# Patient Record
Sex: Male | Born: 1955
Health system: Southern US, Community
[De-identification: ages and names within clinical notes are randomized; demographics above are authoritative.]

## PROBLEM LIST (undated history)

## (undated) DIAGNOSIS — M25519 Pain in unspecified shoulder: Secondary | ICD-10-CM

## (undated) DIAGNOSIS — K900 Celiac disease: Secondary | ICD-10-CM

## (undated) DIAGNOSIS — M549 Dorsalgia, unspecified: Secondary | ICD-10-CM

## (undated) DIAGNOSIS — E785 Hyperlipidemia, unspecified: Secondary | ICD-10-CM

## (undated) DIAGNOSIS — E139 Other specified diabetes mellitus without complications: Secondary | ICD-10-CM

## (undated) DIAGNOSIS — G8929 Other chronic pain: Secondary | ICD-10-CM

## (undated) DIAGNOSIS — E1140 Type 2 diabetes mellitus with diabetic neuropathy, unspecified: Secondary | ICD-10-CM

## (undated) DIAGNOSIS — K648 Other hemorrhoids: Secondary | ICD-10-CM

## (undated) DIAGNOSIS — I10 Essential (primary) hypertension: Secondary | ICD-10-CM

## (undated) HISTORY — DX: Other chronic pain: M25.519

## (undated) HISTORY — PX: COLONOSCOPY: SHX174

## (undated) HISTORY — DX: Type 2 diabetes mellitus with diabetic neuropathy, unspecified: E11.40

## (undated) HISTORY — DX: Other chronic pain: M54.9

## (undated) HISTORY — DX: Essential (primary) hypertension: I10

## (undated) HISTORY — DX: Celiac disease: K90.0

## (undated) HISTORY — DX: Hyperlipidemia, unspecified: E78.5

## (undated) HISTORY — DX: Other specified diabetes mellitus without complications: E13.9

## (undated) HISTORY — PX: NECK SURGERY: SHX720

## (undated) HISTORY — DX: Other chronic pain: G89.29

## (undated) HISTORY — DX: Other hemorrhoids: K64.8

---

## 1998-03-06 ENCOUNTER — Other Ambulatory Visit: Admission: RE | Admit: 1998-03-06 | Discharge: 1998-03-06 | Payer: Self-pay | Admitting: Dermatology

## 1999-01-14 ENCOUNTER — Encounter: Admission: RE | Admit: 1999-01-14 | Discharge: 1999-04-14 | Payer: Self-pay | Admitting: Endocrinology

## 1999-04-23 ENCOUNTER — Encounter: Admission: RE | Admit: 1999-04-23 | Discharge: 1999-07-22 | Payer: Self-pay | Admitting: Endocrinology

## 2001-04-24 ENCOUNTER — Emergency Department (HOSPITAL_COMMUNITY): Admission: EM | Admit: 2001-04-24 | Discharge: 2001-04-24 | Payer: Self-pay | Admitting: Emergency Medicine

## 2007-09-14 HISTORY — PX: SHOULDER SURGERY: SHX246

## 2007-12-25 ENCOUNTER — Encounter: Admission: RE | Admit: 2007-12-25 | Discharge: 2007-12-25 | Payer: Self-pay | Admitting: Sports Medicine

## 2009-09-13 HISTORY — PX: ESOPHAGOGASTRODUODENOSCOPY: SHX1529

## 2014-07-26 ENCOUNTER — Other Ambulatory Visit: Payer: Self-pay | Admitting: Endocrinology

## 2014-07-26 DIAGNOSIS — R109 Unspecified abdominal pain: Secondary | ICD-10-CM

## 2014-08-02 ENCOUNTER — Other Ambulatory Visit: Payer: Self-pay

## 2015-10-07 DIAGNOSIS — M5412 Radiculopathy, cervical region: Secondary | ICD-10-CM | POA: Insufficient documentation

## 2015-12-16 DIAGNOSIS — M542 Cervicalgia: Secondary | ICD-10-CM | POA: Diagnosis not present

## 2015-12-23 DIAGNOSIS — G894 Chronic pain syndrome: Secondary | ICD-10-CM | POA: Diagnosis not present

## 2015-12-23 DIAGNOSIS — M545 Low back pain: Secondary | ICD-10-CM | POA: Diagnosis not present

## 2015-12-23 DIAGNOSIS — M25512 Pain in left shoulder: Secondary | ICD-10-CM | POA: Diagnosis not present

## 2015-12-23 DIAGNOSIS — M25511 Pain in right shoulder: Secondary | ICD-10-CM | POA: Diagnosis not present

## 2016-01-20 DIAGNOSIS — G894 Chronic pain syndrome: Secondary | ICD-10-CM | POA: Diagnosis not present

## 2016-01-20 DIAGNOSIS — M25512 Pain in left shoulder: Secondary | ICD-10-CM | POA: Diagnosis not present

## 2016-01-20 DIAGNOSIS — M545 Low back pain: Secondary | ICD-10-CM | POA: Diagnosis not present

## 2016-01-20 DIAGNOSIS — M25511 Pain in right shoulder: Secondary | ICD-10-CM | POA: Diagnosis not present

## 2016-01-20 DIAGNOSIS — Z79891 Long term (current) use of opiate analgesic: Secondary | ICD-10-CM | POA: Diagnosis not present

## 2016-01-23 DIAGNOSIS — E1065 Type 1 diabetes mellitus with hyperglycemia: Secondary | ICD-10-CM | POA: Diagnosis not present

## 2016-02-12 DIAGNOSIS — E1021 Type 1 diabetes mellitus with diabetic nephropathy: Secondary | ICD-10-CM | POA: Diagnosis not present

## 2016-02-12 DIAGNOSIS — E1142 Type 2 diabetes mellitus with diabetic polyneuropathy: Secondary | ICD-10-CM | POA: Diagnosis not present

## 2016-02-17 DIAGNOSIS — M542 Cervicalgia: Secondary | ICD-10-CM | POA: Diagnosis not present

## 2016-02-17 DIAGNOSIS — I10 Essential (primary) hypertension: Secondary | ICD-10-CM | POA: Diagnosis not present

## 2016-02-17 DIAGNOSIS — Z6832 Body mass index (BMI) 32.0-32.9, adult: Secondary | ICD-10-CM | POA: Diagnosis not present

## 2016-02-19 DIAGNOSIS — M546 Pain in thoracic spine: Secondary | ICD-10-CM | POA: Diagnosis not present

## 2016-02-19 DIAGNOSIS — Z79891 Long term (current) use of opiate analgesic: Secondary | ICD-10-CM | POA: Diagnosis not present

## 2016-02-19 DIAGNOSIS — M25512 Pain in left shoulder: Secondary | ICD-10-CM | POA: Diagnosis not present

## 2016-02-19 DIAGNOSIS — M25511 Pain in right shoulder: Secondary | ICD-10-CM | POA: Diagnosis not present

## 2016-02-19 DIAGNOSIS — M542 Cervicalgia: Secondary | ICD-10-CM | POA: Diagnosis not present

## 2016-03-03 DIAGNOSIS — F411 Generalized anxiety disorder: Secondary | ICD-10-CM | POA: Diagnosis not present

## 2016-03-05 DIAGNOSIS — E785 Hyperlipidemia, unspecified: Secondary | ICD-10-CM | POA: Insufficient documentation

## 2016-03-05 DIAGNOSIS — N059 Unspecified nephritic syndrome with unspecified morphologic changes: Secondary | ICD-10-CM | POA: Insufficient documentation

## 2016-03-05 DIAGNOSIS — E109 Type 1 diabetes mellitus without complications: Secondary | ICD-10-CM | POA: Insufficient documentation

## 2016-03-05 DIAGNOSIS — E10649 Type 1 diabetes mellitus with hypoglycemia without coma: Secondary | ICD-10-CM

## 2016-03-05 DIAGNOSIS — E1140 Type 2 diabetes mellitus with diabetic neuropathy, unspecified: Secondary | ICD-10-CM | POA: Insufficient documentation

## 2016-03-05 DIAGNOSIS — E559 Vitamin D deficiency, unspecified: Secondary | ICD-10-CM | POA: Insufficient documentation

## 2016-03-05 DIAGNOSIS — I10 Essential (primary) hypertension: Secondary | ICD-10-CM | POA: Insufficient documentation

## 2016-03-05 DIAGNOSIS — E1065 Type 1 diabetes mellitus with hyperglycemia: Secondary | ICD-10-CM | POA: Insufficient documentation

## 2016-03-05 DIAGNOSIS — E1049 Type 1 diabetes mellitus with other diabetic neurological complication: Secondary | ICD-10-CM

## 2016-03-19 ENCOUNTER — Ambulatory Visit (INDEPENDENT_AMBULATORY_CARE_PROVIDER_SITE_OTHER): Payer: BLUE CROSS/BLUE SHIELD | Admitting: Cardiovascular Disease

## 2016-03-19 ENCOUNTER — Encounter: Payer: Self-pay | Admitting: Cardiovascular Disease

## 2016-03-19 ENCOUNTER — Encounter (INDEPENDENT_AMBULATORY_CARE_PROVIDER_SITE_OTHER): Payer: Self-pay

## 2016-03-19 VITALS — BP 156/84 | HR 68 | Ht 66.0 in | Wt 166.0 lb

## 2016-03-19 DIAGNOSIS — R0789 Other chest pain: Secondary | ICD-10-CM

## 2016-03-19 DIAGNOSIS — I10 Essential (primary) hypertension: Secondary | ICD-10-CM | POA: Diagnosis not present

## 2016-03-19 DIAGNOSIS — R079 Chest pain, unspecified: Secondary | ICD-10-CM | POA: Insufficient documentation

## 2016-03-19 NOTE — Progress Notes (Signed)
Cardiology Office Note   Date:  03/19/2016   ID:  Kyle CarmineJerry Penninger, DOB 03/13/1956, MRN [id number]  PCP:  Julian HySOUTH,STEPHEN ALAN, MD  Cardiologist:   Kristeen MissPhilip Nahser, MD   Chief Complaint  Patient presents with  . Chest Pain   Problem list 1. Chest discomfort 2. Diabetes mellitus type 1 -with diabetic nephropathy and diabetic neuropathy 3. Essential hypertension 4. Hyperlipidemia   History of Present Illness: Kyle Velazquez is a 60 y.o. male who presents for Further evaluation of his chest pain. Has type 1 DM.  Has had chest pain ever since he had neck  surgery, FEb. 2017   Chest pain - not worsened with exertion .   Occurs if he twists his neck or torso.   No associated dyspnea.  Has not had a stress test in the past 10 years.   He works out every day .   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    Past Medical History  Diagnosis Date  . Hyperlipidemia   . Hypertension   . Diabetic neuropathy [facility](HCC)     Past Surgical History  Procedure Laterality Date  . Shoulder surgery  2009     Current Outpatient Prescriptions  Medication Sig Dispense Refill  . ALPRAZolam (XANAX) 1 MG tablet Take 1 mg by mouth 3 (three) times daily as needed. AS NEEDED FOR ANXIETY  0  . aspirin 81 MG chewable tablet Chew 81 mg by mouth daily.    Marland Kitchen. glucose blood test strip 1 each by Other route as needed for other. Use as instructed    . insulin aspart (NOVOLOG) 100 UNIT/ML injection Inject into the skin 3 (three) times daily before meals. INJECT UP TO 70 UNITS DAILY ON A SLIDING SCALE    . PERCOCET 10-325 MG tablet Take 1 tablet by mouth every 6 (six) hours as needed. AS NEEDED FOR PAIN  0  . ramipril (ALTACE) 5 MG capsule Take 5 mg by mouth daily.    . sertraline (ZOLOFT) 50 MG tablet Take 25 mg by mouth daily.  0  . valACYclovir (VALTREX) 500 MG tablet Take 500 mg by mouth daily.    Marland Kitchen. VIAGRA 100 MG tablet Take 100 mg by mouth daily as needed. AS NEEDED FOR ED  0  . Vitamin D,  Ergocalciferol, (DRISDOL) 50000 units CAPS capsule Take 50,000 Units by mouth every 7 (seven) days.     No current facility-administered medications for this visit.    Allergies:   Review of patient's allergies indicates not on file.    Social History:  The patient  reports that he has quit smoking. He does not have any smokeless tobacco history on file. He reports that he does not drink alcohol or use illicit drugs.   Family History:  The patient's family history includes CAD in his father and mother.    ROS:  Please see the history of present illness.    Review of Systems: Constitutional:  denies fever, chills, diaphoresis, appetite change and fatigue.  HEENT: denies photophobia, eye pain, redness, hearing loss, ear pain, congestion, sore throat, rhinorrhea, sneezing, neck pain, neck stiffness and tinnitus.  Respiratory: denies SOB, DOE, cough, chest tightness, and wheezing.  Cardiovascular: denies chest pain, palpitations and leg swelling.  Gastrointestinal: denies nausea, vomiting, abdominal pain, diarrhea, constipation, blood in stool.  Genitourinary: denies dysuria, urgency, frequency, hematuria, flank pain and difficulty urinating.  Musculoskeletal: denies  myalgias, back pain, joint swelling, arthralgias and gait problem.   Skin: denies pallor, rash  and wound.  Neurological: denies dizziness, seizures, syncope, weakness, light-headedness, numbness and headaches.   Hematological: denies adenopathy, easy bruising, personal or family bleeding history.  Psychiatric/ Behavioral: denies suicidal ideation, mood changes, confusion, nervousness, sleep disturbance and agitation.     All other systems are reviewed and negative.    PHYSICAL EXAM: VS:  BP 156/84 mmHg  Pulse 68  Ht 5\' 6"  (1.676 m)  Wt 166 lb (75.297 kg)  BMI 26.81 kg/m2 , BMI Body mass index is 26.81 kg/(m^2). GEN: Well nourished, well developed, in no acute distress HEENT: normal Neck: no JVD, carotid bruits, or  masses Cardiac: RRR; no murmurs, rubs, or gallops,no edema  Respiratory:  clear to auscultation bilaterally, normal work of breathing GI: soft, nontender, nondistended, + BS MS: no deformity or atrophy Skin: warm and dry, no rash Neuro:  Strength and sensation are intact Psych: normal   EKG:  EKG is ordered today. The ekg ordered today demonstrates NSR at 68 with sinus arhythmia.   Recent Labs: No results found for requested labs within last 365 days.    Lipid Panel No results found for: CHOL, TRIG, HDL, CHOLHDL, VLDL, LDLCALC, LDLDIRECT    Wt Readings from Last 3 Encounters:  03/19/16 166 lb (75.297 kg)     Other studies Reviewed: Additional studies/ records that were reviewed today include: . Review of the above records demonstrates:    ASSESSMENT AND PLAN:  1.  Chest pain :   Sounds atypical ,  Seems to be related to his neck issues. .    Worse in the AM when he wakes up.   Not related to exertion.  His symptoms sound very atypical and are not likely ischemic in nature   He Does have risk factors for coronary artery disease so we will see him back in year for follow-up visit. We discussed typical symptoms of angina including chest tightness with exertion. He'll let us know if he has any other symptoms.         Current medicines are reviewed at length with the patient today.  The patient does not have concerns regarding medicines.  The following changes have been made:  no change  Labs/ tests ordered today include:  No orders of the defined types were placed in this encounter.     Disposition:   FU with me in 1 year      Kristeen MissPhilip Nahser, MD  03/19/2016 2:52 PM    [facility] [address], [place], [place]  [address] Phone: [phone]; Fax: [phone]   [facility]  [address] [place], [place]  [address] [phone]   Fax [phone]

## 2016-03-19 NOTE — Patient Instructions (Signed)

## 2016-03-22 DIAGNOSIS — G894 Chronic pain syndrome: Secondary | ICD-10-CM | POA: Diagnosis not present

## 2016-03-22 DIAGNOSIS — M25512 Pain in left shoulder: Secondary | ICD-10-CM | POA: Diagnosis not present

## 2016-03-22 DIAGNOSIS — Z79891 Long term (current) use of opiate analgesic: Secondary | ICD-10-CM | POA: Diagnosis not present

## 2016-03-22 DIAGNOSIS — M5412 Radiculopathy, cervical region: Secondary | ICD-10-CM | POA: Diagnosis not present

## 2016-03-22 DIAGNOSIS — M25511 Pain in right shoulder: Secondary | ICD-10-CM | POA: Diagnosis not present

## 2016-04-12 DIAGNOSIS — Z6832 Body mass index (BMI) 32.0-32.9, adult: Secondary | ICD-10-CM | POA: Diagnosis not present

## 2016-04-12 DIAGNOSIS — I10 Essential (primary) hypertension: Secondary | ICD-10-CM | POA: Diagnosis not present

## 2016-04-12 DIAGNOSIS — M542 Cervicalgia: Secondary | ICD-10-CM | POA: Diagnosis not present

## 2016-04-17 ENCOUNTER — Other Ambulatory Visit: Payer: Self-pay | Admitting: Neurological Surgery

## 2016-04-17 DIAGNOSIS — M542 Cervicalgia: Secondary | ICD-10-CM

## 2016-04-21 DIAGNOSIS — M25512 Pain in left shoulder: Secondary | ICD-10-CM | POA: Diagnosis not present

## 2016-04-21 DIAGNOSIS — Z79891 Long term (current) use of opiate analgesic: Secondary | ICD-10-CM | POA: Diagnosis not present

## 2016-04-21 DIAGNOSIS — M545 Low back pain: Secondary | ICD-10-CM | POA: Diagnosis not present

## 2016-04-21 DIAGNOSIS — M542 Cervicalgia: Secondary | ICD-10-CM | POA: Diagnosis not present

## 2016-04-21 DIAGNOSIS — G894 Chronic pain syndrome: Secondary | ICD-10-CM | POA: Diagnosis not present

## 2016-04-23 ENCOUNTER — Ambulatory Visit
Admission: RE | Admit: 2016-04-23 | Discharge: 2016-04-23 | Disposition: A | Discharge status: Home / Self Care | Payer: BLUE CROSS/BLUE SHIELD | Source: Ambulatory Visit | Attending: Neurological Surgery | Admitting: Neurological Surgery

## 2016-04-23 DIAGNOSIS — M542 Cervicalgia: Secondary | ICD-10-CM

## 2016-04-23 DIAGNOSIS — M50321 Other cervical disc degeneration at C4-C5 level: Secondary | ICD-10-CM | POA: Diagnosis not present

## 2016-04-23 DIAGNOSIS — M50323 Other cervical disc degeneration at C6-C7 level: Secondary | ICD-10-CM | POA: Diagnosis not present

## 2016-04-23 DIAGNOSIS — M50322 Other cervical disc degeneration at C5-C6 level: Secondary | ICD-10-CM | POA: Diagnosis not present

## 2016-04-24 DIAGNOSIS — Z23 Encounter for immunization: Secondary | ICD-10-CM | POA: Diagnosis not present

## 2016-05-04 DIAGNOSIS — M542 Cervicalgia: Secondary | ICD-10-CM | POA: Diagnosis not present

## 2016-05-04 DIAGNOSIS — Z6832 Body mass index (BMI) 32.0-32.9, adult: Secondary | ICD-10-CM | POA: Diagnosis not present

## 2016-05-04 DIAGNOSIS — I10 Essential (primary) hypertension: Secondary | ICD-10-CM | POA: Diagnosis not present

## 2016-05-11 DIAGNOSIS — H40013 Open angle with borderline findings, low risk, bilateral: Secondary | ICD-10-CM | POA: Diagnosis not present

## 2016-05-11 DIAGNOSIS — H11152 Pinguecula, left eye: Secondary | ICD-10-CM | POA: Diagnosis not present

## 2016-05-13 DIAGNOSIS — E1065 Type 1 diabetes mellitus with hyperglycemia: Secondary | ICD-10-CM | POA: Diagnosis not present

## 2016-05-18 DIAGNOSIS — Z79891 Long term (current) use of opiate analgesic: Secondary | ICD-10-CM | POA: Diagnosis not present

## 2016-05-19 DIAGNOSIS — M25512 Pain in left shoulder: Secondary | ICD-10-CM | POA: Diagnosis not present

## 2016-05-19 DIAGNOSIS — M545 Low back pain: Secondary | ICD-10-CM | POA: Diagnosis not present

## 2016-05-19 DIAGNOSIS — G894 Chronic pain syndrome: Secondary | ICD-10-CM | POA: Diagnosis not present

## 2016-05-19 DIAGNOSIS — M542 Cervicalgia: Secondary | ICD-10-CM | POA: Diagnosis not present

## 2016-06-15 DIAGNOSIS — G894 Chronic pain syndrome: Secondary | ICD-10-CM | POA: Diagnosis not present

## 2016-06-15 DIAGNOSIS — M79621 Pain in right upper arm: Secondary | ICD-10-CM | POA: Diagnosis not present

## 2016-06-15 DIAGNOSIS — M25512 Pain in left shoulder: Secondary | ICD-10-CM | POA: Diagnosis not present

## 2016-06-15 DIAGNOSIS — R0683 Snoring: Secondary | ICD-10-CM | POA: Diagnosis not present

## 2016-06-15 DIAGNOSIS — M545 Low back pain: Secondary | ICD-10-CM | POA: Diagnosis not present

## 2016-06-15 DIAGNOSIS — M79622 Pain in left upper arm: Secondary | ICD-10-CM | POA: Diagnosis not present

## 2016-06-15 DIAGNOSIS — M542 Cervicalgia: Secondary | ICD-10-CM | POA: Diagnosis not present

## 2016-06-21 DIAGNOSIS — E1021 Type 1 diabetes mellitus with diabetic nephropathy: Secondary | ICD-10-CM | POA: Diagnosis not present

## 2016-06-21 DIAGNOSIS — Z4681 Encounter for fitting and adjustment of insulin pump: Secondary | ICD-10-CM | POA: Diagnosis not present

## 2016-06-21 DIAGNOSIS — N08 Glomerular disorders in diseases classified elsewhere: Secondary | ICD-10-CM | POA: Diagnosis not present

## 2016-06-21 DIAGNOSIS — I10 Essential (primary) hypertension: Secondary | ICD-10-CM | POA: Diagnosis not present

## 2016-06-24 DIAGNOSIS — R079 Chest pain, unspecified: Secondary | ICD-10-CM | POA: Diagnosis not present

## 2016-06-24 DIAGNOSIS — E1021 Type 1 diabetes mellitus with diabetic nephropathy: Secondary | ICD-10-CM | POA: Diagnosis not present

## 2016-06-24 DIAGNOSIS — E559 Vitamin D deficiency, unspecified: Secondary | ICD-10-CM | POA: Diagnosis not present

## 2016-06-24 DIAGNOSIS — N08 Glomerular disorders in diseases classified elsewhere: Secondary | ICD-10-CM | POA: Diagnosis not present

## 2016-07-13 DIAGNOSIS — M545 Low back pain: Secondary | ICD-10-CM | POA: Diagnosis not present

## 2016-07-13 DIAGNOSIS — Z79891 Long term (current) use of opiate analgesic: Secondary | ICD-10-CM | POA: Diagnosis not present

## 2016-07-13 DIAGNOSIS — M542 Cervicalgia: Secondary | ICD-10-CM | POA: Diagnosis not present

## 2016-07-13 DIAGNOSIS — M25511 Pain in right shoulder: Secondary | ICD-10-CM | POA: Diagnosis not present

## 2016-07-13 DIAGNOSIS — G894 Chronic pain syndrome: Secondary | ICD-10-CM | POA: Diagnosis not present

## 2016-08-09 DIAGNOSIS — E1065 Type 1 diabetes mellitus with hyperglycemia: Secondary | ICD-10-CM | POA: Diagnosis not present

## 2016-08-10 DIAGNOSIS — G894 Chronic pain syndrome: Secondary | ICD-10-CM | POA: Diagnosis not present

## 2016-08-10 DIAGNOSIS — M545 Low back pain: Secondary | ICD-10-CM | POA: Diagnosis not present

## 2016-08-10 DIAGNOSIS — Z79891 Long term (current) use of opiate analgesic: Secondary | ICD-10-CM | POA: Diagnosis not present

## 2016-08-10 DIAGNOSIS — M25512 Pain in left shoulder: Secondary | ICD-10-CM | POA: Diagnosis not present

## 2016-08-10 DIAGNOSIS — M542 Cervicalgia: Secondary | ICD-10-CM | POA: Diagnosis not present

## 2016-09-07 DIAGNOSIS — M5412 Radiculopathy, cervical region: Secondary | ICD-10-CM | POA: Diagnosis not present

## 2016-09-07 DIAGNOSIS — M25512 Pain in left shoulder: Secondary | ICD-10-CM | POA: Diagnosis not present

## 2016-09-07 DIAGNOSIS — Z79891 Long term (current) use of opiate analgesic: Secondary | ICD-10-CM | POA: Diagnosis not present

## 2016-09-07 DIAGNOSIS — M79621 Pain in right upper arm: Secondary | ICD-10-CM | POA: Diagnosis not present

## 2016-09-07 DIAGNOSIS — M25511 Pain in right shoulder: Secondary | ICD-10-CM | POA: Diagnosis not present

## 2016-09-08 DIAGNOSIS — F411 Generalized anxiety disorder: Secondary | ICD-10-CM | POA: Diagnosis not present

## 2016-09-23 DIAGNOSIS — E1065 Type 1 diabetes mellitus with hyperglycemia: Secondary | ICD-10-CM | POA: Diagnosis not present

## 2016-10-05 DIAGNOSIS — M542 Cervicalgia: Secondary | ICD-10-CM | POA: Diagnosis not present

## 2016-10-05 DIAGNOSIS — M545 Low back pain: Secondary | ICD-10-CM | POA: Diagnosis not present

## 2016-10-05 DIAGNOSIS — G894 Chronic pain syndrome: Secondary | ICD-10-CM | POA: Diagnosis not present

## 2016-10-05 DIAGNOSIS — M25511 Pain in right shoulder: Secondary | ICD-10-CM | POA: Diagnosis not present

## 2016-10-05 DIAGNOSIS — Z79891 Long term (current) use of opiate analgesic: Secondary | ICD-10-CM | POA: Diagnosis not present

## 2016-11-01 DIAGNOSIS — R079 Chest pain, unspecified: Secondary | ICD-10-CM | POA: Diagnosis not present

## 2016-11-01 DIAGNOSIS — Z1389 Encounter for screening for other disorder: Secondary | ICD-10-CM | POA: Diagnosis not present

## 2016-11-01 DIAGNOSIS — E559 Vitamin D deficiency, unspecified: Secondary | ICD-10-CM | POA: Diagnosis not present

## 2016-11-01 DIAGNOSIS — E784 Other hyperlipidemia: Secondary | ICD-10-CM | POA: Diagnosis not present

## 2016-11-01 DIAGNOSIS — N08 Glomerular disorders in diseases classified elsewhere: Secondary | ICD-10-CM | POA: Diagnosis not present

## 2016-11-01 DIAGNOSIS — E1040 Type 1 diabetes mellitus with diabetic neuropathy, unspecified: Secondary | ICD-10-CM | POA: Diagnosis not present

## 2016-11-01 DIAGNOSIS — E1142 Type 2 diabetes mellitus with diabetic polyneuropathy: Secondary | ICD-10-CM | POA: Diagnosis not present

## 2016-11-01 DIAGNOSIS — E291 Testicular hypofunction: Secondary | ICD-10-CM | POA: Diagnosis not present

## 2016-11-01 DIAGNOSIS — M5412 Radiculopathy, cervical region: Secondary | ICD-10-CM | POA: Diagnosis not present

## 2016-11-02 DIAGNOSIS — G894 Chronic pain syndrome: Secondary | ICD-10-CM | POA: Diagnosis not present

## 2016-11-02 DIAGNOSIS — Z79891 Long term (current) use of opiate analgesic: Secondary | ICD-10-CM | POA: Diagnosis not present

## 2016-11-02 DIAGNOSIS — M545 Low back pain: Secondary | ICD-10-CM | POA: Diagnosis not present

## 2016-11-02 DIAGNOSIS — M542 Cervicalgia: Secondary | ICD-10-CM | POA: Diagnosis not present

## 2016-11-02 DIAGNOSIS — M25512 Pain in left shoulder: Secondary | ICD-10-CM | POA: Diagnosis not present

## 2016-11-11 DIAGNOSIS — E119 Type 2 diabetes mellitus without complications: Secondary | ICD-10-CM | POA: Diagnosis not present

## 2016-11-11 DIAGNOSIS — H35361 Drusen (degenerative) of macula, right eye: Secondary | ICD-10-CM | POA: Diagnosis not present

## 2016-11-11 DIAGNOSIS — H2513 Age-related nuclear cataract, bilateral: Secondary | ICD-10-CM | POA: Diagnosis not present

## 2016-11-11 DIAGNOSIS — H40013 Open angle with borderline findings, low risk, bilateral: Secondary | ICD-10-CM | POA: Diagnosis not present

## 2016-11-30 DIAGNOSIS — M25512 Pain in left shoulder: Secondary | ICD-10-CM | POA: Diagnosis not present

## 2016-11-30 DIAGNOSIS — G894 Chronic pain syndrome: Secondary | ICD-10-CM | POA: Diagnosis not present

## 2016-11-30 DIAGNOSIS — Z79891 Long term (current) use of opiate analgesic: Secondary | ICD-10-CM | POA: Diagnosis not present

## 2016-11-30 DIAGNOSIS — M542 Cervicalgia: Secondary | ICD-10-CM | POA: Diagnosis not present

## 2016-11-30 DIAGNOSIS — M545 Low back pain: Secondary | ICD-10-CM | POA: Diagnosis not present

## 2016-12-06 DIAGNOSIS — N401 Enlarged prostate with lower urinary tract symptoms: Secondary | ICD-10-CM | POA: Diagnosis not present

## 2016-12-06 DIAGNOSIS — E291 Testicular hypofunction: Secondary | ICD-10-CM | POA: Diagnosis not present

## 2016-12-06 DIAGNOSIS — N5201 Erectile dysfunction due to arterial insufficiency: Secondary | ICD-10-CM | POA: Diagnosis not present

## 2016-12-21 DIAGNOSIS — N5201 Erectile dysfunction due to arterial insufficiency: Secondary | ICD-10-CM | POA: Diagnosis not present

## 2016-12-21 DIAGNOSIS — R350 Frequency of micturition: Secondary | ICD-10-CM | POA: Diagnosis not present

## 2016-12-21 DIAGNOSIS — N401 Enlarged prostate with lower urinary tract symptoms: Secondary | ICD-10-CM | POA: Diagnosis not present

## 2016-12-21 DIAGNOSIS — E291 Testicular hypofunction: Secondary | ICD-10-CM | POA: Diagnosis not present

## 2016-12-27 DIAGNOSIS — N5201 Erectile dysfunction due to arterial insufficiency: Secondary | ICD-10-CM | POA: Diagnosis not present

## 2016-12-28 DIAGNOSIS — M542 Cervicalgia: Secondary | ICD-10-CM | POA: Diagnosis not present

## 2016-12-28 DIAGNOSIS — M545 Low back pain: Secondary | ICD-10-CM | POA: Diagnosis not present

## 2016-12-28 DIAGNOSIS — M25512 Pain in left shoulder: Secondary | ICD-10-CM | POA: Diagnosis not present

## 2016-12-28 DIAGNOSIS — G894 Chronic pain syndrome: Secondary | ICD-10-CM | POA: Diagnosis not present

## 2017-01-10 DIAGNOSIS — E1065 Type 1 diabetes mellitus with hyperglycemia: Secondary | ICD-10-CM | POA: Diagnosis not present

## 2017-01-25 DIAGNOSIS — G894 Chronic pain syndrome: Secondary | ICD-10-CM | POA: Diagnosis not present

## 2017-01-25 DIAGNOSIS — M542 Cervicalgia: Secondary | ICD-10-CM | POA: Diagnosis not present

## 2017-01-25 DIAGNOSIS — M545 Low back pain: Secondary | ICD-10-CM | POA: Diagnosis not present

## 2017-01-25 DIAGNOSIS — M25512 Pain in left shoulder: Secondary | ICD-10-CM | POA: Diagnosis not present

## 2017-02-01 DIAGNOSIS — N5201 Erectile dysfunction due to arterial insufficiency: Secondary | ICD-10-CM | POA: Diagnosis not present

## 2017-02-16 DIAGNOSIS — T402X5A Adverse effect of other opioids, initial encounter: Secondary | ICD-10-CM | POA: Diagnosis not present

## 2017-02-16 DIAGNOSIS — K5903 Drug induced constipation: Secondary | ICD-10-CM | POA: Diagnosis not present

## 2017-02-16 DIAGNOSIS — Z79891 Long term (current) use of opiate analgesic: Secondary | ICD-10-CM | POA: Diagnosis not present

## 2017-02-22 DIAGNOSIS — G894 Chronic pain syndrome: Secondary | ICD-10-CM | POA: Diagnosis not present

## 2017-02-22 DIAGNOSIS — M5412 Radiculopathy, cervical region: Secondary | ICD-10-CM | POA: Diagnosis not present

## 2017-02-22 DIAGNOSIS — M25512 Pain in left shoulder: Secondary | ICD-10-CM | POA: Diagnosis not present

## 2017-02-22 DIAGNOSIS — M25511 Pain in right shoulder: Secondary | ICD-10-CM | POA: Diagnosis not present

## 2017-02-23 DIAGNOSIS — F411 Generalized anxiety disorder: Secondary | ICD-10-CM | POA: Diagnosis not present

## 2017-03-02 DIAGNOSIS — N401 Enlarged prostate with lower urinary tract symptoms: Secondary | ICD-10-CM | POA: Diagnosis not present

## 2017-03-02 DIAGNOSIS — N5201 Erectile dysfunction due to arterial insufficiency: Secondary | ICD-10-CM | POA: Diagnosis not present

## 2017-03-02 DIAGNOSIS — R350 Frequency of micturition: Secondary | ICD-10-CM | POA: Diagnosis not present

## 2017-03-15 DIAGNOSIS — N08 Glomerular disorders in diseases classified elsewhere: Secondary | ICD-10-CM | POA: Diagnosis not present

## 2017-03-15 DIAGNOSIS — Z1389 Encounter for screening for other disorder: Secondary | ICD-10-CM | POA: Diagnosis not present

## 2017-03-15 DIAGNOSIS — E1142 Type 2 diabetes mellitus with diabetic polyneuropathy: Secondary | ICD-10-CM | POA: Diagnosis not present

## 2017-03-15 DIAGNOSIS — E1040 Type 1 diabetes mellitus with diabetic neuropathy, unspecified: Secondary | ICD-10-CM | POA: Diagnosis not present

## 2017-03-15 DIAGNOSIS — E162 Hypoglycemia, unspecified: Secondary | ICD-10-CM | POA: Diagnosis not present

## 2017-03-22 DIAGNOSIS — M25512 Pain in left shoulder: Secondary | ICD-10-CM | POA: Diagnosis not present

## 2017-03-22 DIAGNOSIS — M545 Low back pain: Secondary | ICD-10-CM | POA: Diagnosis not present

## 2017-03-22 DIAGNOSIS — M542 Cervicalgia: Secondary | ICD-10-CM | POA: Diagnosis not present

## 2017-03-22 DIAGNOSIS — G894 Chronic pain syndrome: Secondary | ICD-10-CM | POA: Diagnosis not present

## 2017-03-28 DIAGNOSIS — Z0189 Encounter for other specified special examinations: Secondary | ICD-10-CM | POA: Diagnosis not present

## 2017-03-28 DIAGNOSIS — N529 Male erectile dysfunction, unspecified: Secondary | ICD-10-CM | POA: Diagnosis not present

## 2017-03-28 DIAGNOSIS — I10 Essential (primary) hypertension: Secondary | ICD-10-CM | POA: Diagnosis not present

## 2017-03-28 DIAGNOSIS — E1065 Type 1 diabetes mellitus with hyperglycemia: Secondary | ICD-10-CM | POA: Diagnosis not present

## 2017-04-13 DIAGNOSIS — N5201 Erectile dysfunction due to arterial insufficiency: Secondary | ICD-10-CM | POA: Diagnosis not present

## 2017-04-19 DIAGNOSIS — G894 Chronic pain syndrome: Secondary | ICD-10-CM | POA: Diagnosis not present

## 2017-04-19 DIAGNOSIS — R0683 Snoring: Secondary | ICD-10-CM | POA: Diagnosis not present

## 2017-04-19 DIAGNOSIS — M79622 Pain in left upper arm: Secondary | ICD-10-CM | POA: Diagnosis not present

## 2017-04-19 DIAGNOSIS — M79621 Pain in right upper arm: Secondary | ICD-10-CM | POA: Diagnosis not present

## 2017-05-04 DIAGNOSIS — M65342 Trigger finger, left ring finger: Secondary | ICD-10-CM | POA: Diagnosis not present

## 2017-05-20 DIAGNOSIS — M25512 Pain in left shoulder: Secondary | ICD-10-CM | POA: Diagnosis not present

## 2017-05-20 DIAGNOSIS — M542 Cervicalgia: Secondary | ICD-10-CM | POA: Diagnosis not present

## 2017-05-20 DIAGNOSIS — G894 Chronic pain syndrome: Secondary | ICD-10-CM | POA: Diagnosis not present

## 2017-05-20 DIAGNOSIS — M545 Low back pain: Secondary | ICD-10-CM | POA: Diagnosis not present

## 2017-05-24 DIAGNOSIS — N5201 Erectile dysfunction due to arterial insufficiency: Secondary | ICD-10-CM | POA: Diagnosis not present

## 2017-06-09 DIAGNOSIS — F411 Generalized anxiety disorder: Secondary | ICD-10-CM | POA: Diagnosis not present

## 2017-06-14 DIAGNOSIS — M65331 Trigger finger, right middle finger: Secondary | ICD-10-CM | POA: Diagnosis not present

## 2017-06-14 DIAGNOSIS — M65341 Trigger finger, right ring finger: Secondary | ICD-10-CM | POA: Diagnosis not present

## 2017-06-17 DIAGNOSIS — M545 Low back pain: Secondary | ICD-10-CM | POA: Diagnosis not present

## 2017-06-17 DIAGNOSIS — M25512 Pain in left shoulder: Secondary | ICD-10-CM | POA: Diagnosis not present

## 2017-06-17 DIAGNOSIS — M542 Cervicalgia: Secondary | ICD-10-CM | POA: Diagnosis not present

## 2017-06-17 DIAGNOSIS — G894 Chronic pain syndrome: Secondary | ICD-10-CM | POA: Diagnosis not present

## 2017-06-24 DIAGNOSIS — M65331 Trigger finger, right middle finger: Secondary | ICD-10-CM | POA: Diagnosis not present

## 2017-06-24 DIAGNOSIS — M65341 Trigger finger, right ring finger: Secondary | ICD-10-CM | POA: Diagnosis not present

## 2017-06-28 DIAGNOSIS — H40011 Open angle with borderline findings, low risk, right eye: Secondary | ICD-10-CM | POA: Diagnosis not present

## 2017-06-28 DIAGNOSIS — H40012 Open angle with borderline findings, low risk, left eye: Secondary | ICD-10-CM | POA: Diagnosis not present

## 2017-06-28 DIAGNOSIS — H40013 Open angle with borderline findings, low risk, bilateral: Secondary | ICD-10-CM | POA: Diagnosis not present

## 2017-07-13 DIAGNOSIS — Z23 Encounter for immunization: Secondary | ICD-10-CM | POA: Diagnosis not present

## 2017-07-13 DIAGNOSIS — E1021 Type 1 diabetes mellitus with diabetic nephropathy: Secondary | ICD-10-CM | POA: Diagnosis not present

## 2017-07-13 DIAGNOSIS — N08 Glomerular disorders in diseases classified elsewhere: Secondary | ICD-10-CM | POA: Diagnosis not present

## 2017-07-13 DIAGNOSIS — E559 Vitamin D deficiency, unspecified: Secondary | ICD-10-CM | POA: Diagnosis not present

## 2017-07-13 DIAGNOSIS — E1142 Type 2 diabetes mellitus with diabetic polyneuropathy: Secondary | ICD-10-CM | POA: Diagnosis not present

## 2017-07-15 DIAGNOSIS — M545 Low back pain: Secondary | ICD-10-CM | POA: Diagnosis not present

## 2017-07-15 DIAGNOSIS — M542 Cervicalgia: Secondary | ICD-10-CM | POA: Diagnosis not present

## 2017-07-15 DIAGNOSIS — G890 Central pain syndrome: Secondary | ICD-10-CM | POA: Diagnosis not present

## 2017-07-15 DIAGNOSIS — G894 Chronic pain syndrome: Secondary | ICD-10-CM | POA: Diagnosis not present

## 2017-08-03 DIAGNOSIS — Z6824 Body mass index (BMI) 24.0-24.9, adult: Secondary | ICD-10-CM | POA: Diagnosis not present

## 2017-08-03 DIAGNOSIS — E162 Hypoglycemia, unspecified: Secondary | ICD-10-CM | POA: Diagnosis not present

## 2017-08-03 DIAGNOSIS — R634 Abnormal weight loss: Secondary | ICD-10-CM | POA: Diagnosis not present

## 2017-08-03 DIAGNOSIS — E7849 Other hyperlipidemia: Secondary | ICD-10-CM | POA: Diagnosis not present

## 2017-08-03 DIAGNOSIS — N528 Other male erectile dysfunction: Secondary | ICD-10-CM | POA: Diagnosis not present

## 2017-08-03 DIAGNOSIS — M5412 Radiculopathy, cervical region: Secondary | ICD-10-CM | POA: Diagnosis not present

## 2017-08-03 DIAGNOSIS — R1310 Dysphagia, unspecified: Secondary | ICD-10-CM | POA: Diagnosis not present

## 2017-08-03 DIAGNOSIS — E559 Vitamin D deficiency, unspecified: Secondary | ICD-10-CM | POA: Diagnosis not present

## 2017-08-03 DIAGNOSIS — I10 Essential (primary) hypertension: Secondary | ICD-10-CM | POA: Diagnosis not present

## 2017-08-10 DIAGNOSIS — Z79891 Long term (current) use of opiate analgesic: Secondary | ICD-10-CM | POA: Diagnosis not present

## 2017-08-10 DIAGNOSIS — G8929 Other chronic pain: Secondary | ICD-10-CM | POA: Diagnosis not present

## 2017-08-10 DIAGNOSIS — F1120 Opioid dependence, uncomplicated: Secondary | ICD-10-CM | POA: Diagnosis not present

## 2017-08-10 DIAGNOSIS — G894 Chronic pain syndrome: Secondary | ICD-10-CM | POA: Diagnosis not present

## 2017-08-29 DIAGNOSIS — R1314 Dysphagia, pharyngoesophageal phase: Secondary | ICD-10-CM | POA: Diagnosis not present

## 2017-08-29 DIAGNOSIS — J383 Other diseases of vocal cords: Secondary | ICD-10-CM | POA: Diagnosis not present

## 2017-08-30 ENCOUNTER — Other Ambulatory Visit: Payer: Self-pay | Admitting: Otolaryngology

## 2017-08-30 DIAGNOSIS — R1314 Dysphagia, pharyngoesophageal phase: Secondary | ICD-10-CM

## 2017-09-12 DIAGNOSIS — Z79891 Long term (current) use of opiate analgesic: Secondary | ICD-10-CM | POA: Diagnosis not present

## 2017-09-12 DIAGNOSIS — M542 Cervicalgia: Secondary | ICD-10-CM | POA: Diagnosis not present

## 2017-09-12 DIAGNOSIS — M25511 Pain in right shoulder: Secondary | ICD-10-CM | POA: Diagnosis not present

## 2017-09-12 DIAGNOSIS — G8929 Other chronic pain: Secondary | ICD-10-CM | POA: Diagnosis not present

## 2017-09-12 DIAGNOSIS — G894 Chronic pain syndrome: Secondary | ICD-10-CM | POA: Diagnosis not present

## 2017-09-12 DIAGNOSIS — M545 Low back pain: Secondary | ICD-10-CM | POA: Diagnosis not present

## 2017-09-12 DIAGNOSIS — F1120 Opioid dependence, uncomplicated: Secondary | ICD-10-CM | POA: Diagnosis not present

## 2017-09-16 ENCOUNTER — Ambulatory Visit
Admission: RE | Admit: 2017-09-16 | Discharge: 2017-09-16 | Disposition: A | Discharge status: Home / Self Care | Payer: BLUE CROSS/BLUE SHIELD | Source: Ambulatory Visit | Attending: Otolaryngology | Admitting: Otolaryngology

## 2017-09-16 DIAGNOSIS — K219 Gastro-esophageal reflux disease without esophagitis: Secondary | ICD-10-CM | POA: Diagnosis not present

## 2017-09-16 DIAGNOSIS — R1314 Dysphagia, pharyngoesophageal phase: Secondary | ICD-10-CM

## 2017-09-16 DIAGNOSIS — K449 Diaphragmatic hernia without obstruction or gangrene: Secondary | ICD-10-CM | POA: Diagnosis not present

## 2017-09-26 DIAGNOSIS — M545 Low back pain: Secondary | ICD-10-CM | POA: Diagnosis not present

## 2017-09-29 DIAGNOSIS — E1065 Type 1 diabetes mellitus with hyperglycemia: Secondary | ICD-10-CM | POA: Diagnosis not present

## 2017-10-04 DIAGNOSIS — K900 Celiac disease: Secondary | ICD-10-CM | POA: Diagnosis not present

## 2017-10-04 DIAGNOSIS — R140 Abdominal distension (gaseous): Secondary | ICD-10-CM | POA: Diagnosis not present

## 2017-10-04 DIAGNOSIS — R109 Unspecified abdominal pain: Secondary | ICD-10-CM | POA: Diagnosis not present

## 2017-10-10 DIAGNOSIS — R634 Abnormal weight loss: Secondary | ICD-10-CM | POA: Diagnosis not present

## 2017-10-10 DIAGNOSIS — K449 Diaphragmatic hernia without obstruction or gangrene: Secondary | ICD-10-CM | POA: Diagnosis not present

## 2017-10-10 DIAGNOSIS — R109 Unspecified abdominal pain: Secondary | ICD-10-CM | POA: Diagnosis not present

## 2017-10-11 DIAGNOSIS — R109 Unspecified abdominal pain: Secondary | ICD-10-CM | POA: Diagnosis not present

## 2017-10-11 DIAGNOSIS — K900 Celiac disease: Secondary | ICD-10-CM | POA: Diagnosis not present

## 2017-10-12 DIAGNOSIS — G8929 Other chronic pain: Secondary | ICD-10-CM | POA: Diagnosis not present

## 2017-10-12 DIAGNOSIS — M545 Low back pain: Secondary | ICD-10-CM | POA: Diagnosis not present

## 2017-10-12 DIAGNOSIS — G894 Chronic pain syndrome: Secondary | ICD-10-CM | POA: Diagnosis not present

## 2017-10-12 DIAGNOSIS — Z79891 Long term (current) use of opiate analgesic: Secondary | ICD-10-CM | POA: Diagnosis not present

## 2017-10-12 DIAGNOSIS — M5417 Radiculopathy, lumbosacral region: Secondary | ICD-10-CM | POA: Diagnosis not present

## 2017-10-12 DIAGNOSIS — M25511 Pain in right shoulder: Secondary | ICD-10-CM | POA: Diagnosis not present

## 2017-10-12 DIAGNOSIS — F1120 Opioid dependence, uncomplicated: Secondary | ICD-10-CM | POA: Diagnosis not present

## 2017-10-18 DIAGNOSIS — R109 Unspecified abdominal pain: Secondary | ICD-10-CM | POA: Diagnosis not present

## 2017-10-18 DIAGNOSIS — K828 Other specified diseases of gallbladder: Secondary | ICD-10-CM | POA: Diagnosis not present

## 2017-10-28 DIAGNOSIS — H04123 Dry eye syndrome of bilateral lacrimal glands: Secondary | ICD-10-CM | POA: Diagnosis not present

## 2017-10-28 DIAGNOSIS — H11003 Unspecified pterygium of eye, bilateral: Secondary | ICD-10-CM | POA: Diagnosis not present

## 2017-10-28 DIAGNOSIS — H5713 Ocular pain, bilateral: Secondary | ICD-10-CM | POA: Diagnosis not present

## 2017-11-02 DIAGNOSIS — F411 Generalized anxiety disorder: Secondary | ICD-10-CM | POA: Diagnosis not present

## 2017-11-08 DIAGNOSIS — M542 Cervicalgia: Secondary | ICD-10-CM | POA: Diagnosis not present

## 2017-11-08 DIAGNOSIS — R030 Elevated blood-pressure reading, without diagnosis of hypertension: Secondary | ICD-10-CM | POA: Diagnosis not present

## 2017-11-09 DIAGNOSIS — M546 Pain in thoracic spine: Secondary | ICD-10-CM | POA: Diagnosis not present

## 2017-11-09 DIAGNOSIS — M25512 Pain in left shoulder: Secondary | ICD-10-CM | POA: Diagnosis not present

## 2017-11-09 DIAGNOSIS — G894 Chronic pain syndrome: Secondary | ICD-10-CM | POA: Diagnosis not present

## 2017-11-09 DIAGNOSIS — M25511 Pain in right shoulder: Secondary | ICD-10-CM | POA: Diagnosis not present

## 2017-11-09 DIAGNOSIS — M542 Cervicalgia: Secondary | ICD-10-CM | POA: Diagnosis not present

## 2017-11-30 DIAGNOSIS — M545 Low back pain: Secondary | ICD-10-CM | POA: Diagnosis not present

## 2017-12-05 DIAGNOSIS — Z1389 Encounter for screening for other disorder: Secondary | ICD-10-CM | POA: Diagnosis not present

## 2017-12-05 DIAGNOSIS — R1310 Dysphagia, unspecified: Secondary | ICD-10-CM | POA: Diagnosis not present

## 2017-12-05 DIAGNOSIS — E1142 Type 2 diabetes mellitus with diabetic polyneuropathy: Secondary | ICD-10-CM | POA: Diagnosis not present

## 2017-12-05 DIAGNOSIS — I10 Essential (primary) hypertension: Secondary | ICD-10-CM | POA: Diagnosis not present

## 2017-12-05 DIAGNOSIS — E1040 Type 1 diabetes mellitus with diabetic neuropathy, unspecified: Secondary | ICD-10-CM | POA: Diagnosis not present

## 2017-12-05 DIAGNOSIS — E162 Hypoglycemia, unspecified: Secondary | ICD-10-CM | POA: Diagnosis not present

## 2017-12-13 DIAGNOSIS — M5126 Other intervertebral disc displacement, lumbar region: Secondary | ICD-10-CM | POA: Diagnosis not present

## 2017-12-13 DIAGNOSIS — Z6825 Body mass index (BMI) 25.0-25.9, adult: Secondary | ICD-10-CM | POA: Diagnosis not present

## 2017-12-13 DIAGNOSIS — R030 Elevated blood-pressure reading, without diagnosis of hypertension: Secondary | ICD-10-CM | POA: Diagnosis not present

## 2017-12-15 DIAGNOSIS — L298 Other pruritus: Secondary | ICD-10-CM | POA: Diagnosis not present

## 2017-12-28 DIAGNOSIS — E1065 Type 1 diabetes mellitus with hyperglycemia: Secondary | ICD-10-CM | POA: Diagnosis not present

## 2017-12-30 DIAGNOSIS — E113293 Type 2 diabetes mellitus with mild nonproliferative diabetic retinopathy without macular edema, bilateral: Secondary | ICD-10-CM | POA: Diagnosis not present

## 2017-12-30 DIAGNOSIS — H40013 Open angle with borderline findings, low risk, bilateral: Secondary | ICD-10-CM | POA: Diagnosis not present

## 2017-12-30 DIAGNOSIS — E119 Type 2 diabetes mellitus without complications: Secondary | ICD-10-CM | POA: Diagnosis not present

## 2017-12-30 DIAGNOSIS — H35372 Puckering of macula, left eye: Secondary | ICD-10-CM | POA: Diagnosis not present

## 2017-12-30 DIAGNOSIS — H353131 Nonexudative age-related macular degeneration, bilateral, early dry stage: Secondary | ICD-10-CM | POA: Diagnosis not present

## 2017-12-30 DIAGNOSIS — H524 Presbyopia: Secondary | ICD-10-CM | POA: Diagnosis not present

## 2018-03-29 DIAGNOSIS — E1065 Type 1 diabetes mellitus with hyperglycemia: Secondary | ICD-10-CM | POA: Diagnosis not present

## 2018-04-04 DIAGNOSIS — E1040 Type 1 diabetes mellitus with diabetic neuropathy, unspecified: Secondary | ICD-10-CM | POA: Diagnosis not present

## 2018-04-04 DIAGNOSIS — N08 Glomerular disorders in diseases classified elsewhere: Secondary | ICD-10-CM | POA: Diagnosis not present

## 2018-04-04 DIAGNOSIS — E1142 Type 2 diabetes mellitus with diabetic polyneuropathy: Secondary | ICD-10-CM | POA: Diagnosis not present

## 2018-04-04 DIAGNOSIS — I10 Essential (primary) hypertension: Secondary | ICD-10-CM | POA: Diagnosis not present

## 2018-04-04 DIAGNOSIS — E559 Vitamin D deficiency, unspecified: Secondary | ICD-10-CM | POA: Diagnosis not present

## 2018-04-04 DIAGNOSIS — Z794 Long term (current) use of insulin: Secondary | ICD-10-CM | POA: Diagnosis not present

## 2018-04-04 DIAGNOSIS — K900 Celiac disease: Secondary | ICD-10-CM | POA: Diagnosis not present

## 2018-04-10 DIAGNOSIS — F411 Generalized anxiety disorder: Secondary | ICD-10-CM | POA: Diagnosis not present

## 2018-05-22 DIAGNOSIS — Z6823 Body mass index (BMI) 23.0-23.9, adult: Secondary | ICD-10-CM | POA: Diagnosis not present

## 2018-05-22 DIAGNOSIS — R1030 Lower abdominal pain, unspecified: Secondary | ICD-10-CM | POA: Diagnosis not present

## 2018-05-22 DIAGNOSIS — K4020 Bilateral inguinal hernia, without obstruction or gangrene, not specified as recurrent: Secondary | ICD-10-CM | POA: Diagnosis not present

## 2018-05-22 DIAGNOSIS — E1040 Type 1 diabetes mellitus with diabetic neuropathy, unspecified: Secondary | ICD-10-CM | POA: Diagnosis not present

## 2018-05-31 DIAGNOSIS — K4020 Bilateral inguinal hernia, without obstruction or gangrene, not specified as recurrent: Secondary | ICD-10-CM | POA: Diagnosis not present

## 2018-06-30 DIAGNOSIS — H40013 Open angle with borderline findings, low risk, bilateral: Secondary | ICD-10-CM | POA: Diagnosis not present

## 2018-07-14 DIAGNOSIS — K4020 Bilateral inguinal hernia, without obstruction or gangrene, not specified as recurrent: Secondary | ICD-10-CM | POA: Diagnosis not present

## 2018-07-14 DIAGNOSIS — Z6823 Body mass index (BMI) 23.0-23.9, adult: Secondary | ICD-10-CM | POA: Diagnosis not present

## 2018-07-14 DIAGNOSIS — E1021 Type 1 diabetes mellitus with diabetic nephropathy: Secondary | ICD-10-CM | POA: Diagnosis not present

## 2018-07-14 DIAGNOSIS — R634 Abnormal weight loss: Secondary | ICD-10-CM | POA: Diagnosis not present

## 2018-07-21 DIAGNOSIS — K4020 Bilateral inguinal hernia, without obstruction or gangrene, not specified as recurrent: Secondary | ICD-10-CM | POA: Diagnosis not present

## 2018-08-02 DIAGNOSIS — E7849 Other hyperlipidemia: Secondary | ICD-10-CM | POA: Diagnosis not present

## 2018-08-02 DIAGNOSIS — I10 Essential (primary) hypertension: Secondary | ICD-10-CM | POA: Diagnosis not present

## 2018-08-02 DIAGNOSIS — E291 Testicular hypofunction: Secondary | ICD-10-CM | POA: Diagnosis not present

## 2018-08-02 DIAGNOSIS — E1142 Type 2 diabetes mellitus with diabetic polyneuropathy: Secondary | ICD-10-CM | POA: Diagnosis not present

## 2018-08-09 DIAGNOSIS — E1065 Type 1 diabetes mellitus with hyperglycemia: Secondary | ICD-10-CM | POA: Diagnosis not present

## 2018-08-25 DIAGNOSIS — H5713 Ocular pain, bilateral: Secondary | ICD-10-CM | POA: Diagnosis not present

## 2018-08-25 DIAGNOSIS — H04123 Dry eye syndrome of bilateral lacrimal glands: Secondary | ICD-10-CM | POA: Diagnosis not present

## 2018-09-03 ENCOUNTER — Encounter: Payer: Self-pay | Admitting: Emergency Medicine

## 2018-09-03 DIAGNOSIS — F411 Generalized anxiety disorder: Secondary | ICD-10-CM

## 2018-09-25 ENCOUNTER — Encounter: Payer: Self-pay | Admitting: Psychiatry

## 2018-09-25 ENCOUNTER — Ambulatory Visit: Payer: BLUE CROSS/BLUE SHIELD | Admitting: Psychiatry

## 2018-09-25 DIAGNOSIS — F411 Generalized anxiety disorder: Secondary | ICD-10-CM | POA: Diagnosis not present

## 2018-09-25 NOTE — Progress Notes (Signed)
Niels Jaime [id number] 1956-03-24 63 y.o.  Subjective:   Patient ID:  Kyle Velazquez is a 63 y.o. (DOB 09/19/1955) male.  Chief Complaint:  Chief Complaint  Patient presents with  . Follow-up    Medication management  . Anxiety    HPI Kyle Velazquez presents to the office today for follow-up of GAD.  GD just turned 63 yo and they moved to [place].  Son doing really well with big promotion.  D in [place].  Moved to [place] in September but didn't like it.  Back in [place].  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  Slow tedious process of 8 mos.  Hit a wall mentally and doesn't want to do this anymore.  No other complaints.  Doesn't have control over the time line.  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  Satisfied with meds.    Anxiety manageable.  Chronic issues.  Does well with the meds.  Aware of risks the Bz.    Review of Systems:  Review of Systems  Neurological: Negative for tremors and weakness.  Psychiatric/Behavioral: Negative for agitation, behavioral problems, confusion, decreased concentration, dysphoric mood, hallucinations, self-injury, sleep disturbance and suicidal ideas. The patient is nervous/anxious. The patient is not hyperactive.     Medications: I have reviewed the patient's current medications.  Current Outpatient Medications  Medication Sig Dispense Refill  . ALPRAZolam (XANAX) 1 MG tablet Take 1 mg by mouth 3 (three) times daily as needed. AS NEEDED FOR ANXIETY  0  . aspirin 81 MG chewable tablet Chew 81 mg by mouth daily.    . Continuous Blood Gluc Sensor (FREESTYLE LIBRE 14 DAY SENSOR) MISC by Does not apply route.    Marland Kitchen glucose blood test strip 1 each by Other route as needed for other. Use as instructed    . insulin aspart (NOVOLOG) 100 UNIT/ML injection Inject into the skin 3 (three) times daily before meals. INJECT UP TO 70 UNITS DAILY ON A SLIDING SCALE    . PERCOCET 10-325 MG tablet Take 1 tablet by mouth every 6 (six)  hours as needed. AS NEEDED FOR PAIN  0  . ramipril (ALTACE) 5 MG capsule Take 5 mg by mouth daily.    . sertraline (ZOLOFT) 50 MG tablet Take 25 mg by mouth daily.  0  . valACYclovir (VALTREX) 500 MG tablet Take 500 mg by mouth daily.    Marland Kitchen VIAGRA 100 MG tablet Take 100 mg by mouth daily as needed. AS NEEDED FOR ED  0  . Vitamin D, Ergocalciferol, (DRISDOL) 50000 units CAPS capsule Take 50,000 Units by mouth every 7 (seven) days.     No current facility-administered medications for this visit.     Medication Side Effects: None; no sedation.  Allergies: No Known Allergies  Past Medical History:  Diagnosis Date  . Diabetic neuropathy (HCC)   . Hyperlipidemia   . Hypertension     Family History  Problem Relation Age of Onset  . CAD Mother   . CAD Father     Social History   Socioeconomic History  . Marital status: Married    Spouse name: Not on file  . Number of children: Not on file  . Years of education: Not on file  . Highest education level: Not on file  Occupational History  . Not on file  Social Needs  . Financial resource strain: Not on file  . Food insecurity:    Worry: Not on file  Inability: Not on file  . Transportation needs:    Medical: Not on file    Non-medical: Not on file  Tobacco Use  . Smoking status: Former Games developermoker  . Smokeless tobacco: Never Used  Substance and Sexual Activity  . Alcohol use: No  . Drug use: No  . Sexual activity: Yes  Lifestyle  . Physical activity:    Days per week: Not on file    Minutes per session: Not on file  . Stress: Not on file  Relationships  . Social connections:    Talks on phone: Not on file    Gets together: Not on file    Attends religious service: Not on file    Active member of club or organization: Not on file    Attends meetings of clubs or organizations: Not on file    Relationship status: Not on file  . Intimate partner violence:    Fear of current or ex partner: Not on file    Emotionally  abused: Not on file    Physically abused: Not on file    Forced sexual activity: Not on file  Other Topics Concern  . Not on file  Social History Narrative  . Not on file    Past Medical History, Surgical history, Social history, and Family history were reviewed and updated as appropriate.   Please see review of systems for further details on the patient's review from today.   Objective:   Physical Exam:  There were no vitals taken for this visit.  Physical Exam Constitutional:      General: He is not in acute distress.    Appearance: He is well-developed.  Musculoskeletal:        General: No deformity.  Neurological:     Mental Status: He is alert and oriented to person, place, and time.     Motor: No tremor.     Coordination: Coordination normal.     Gait: Gait normal.  Psychiatric:        Attention and Perception: Attention and perception normal.        Mood and Affect: Mood is anxious. Mood is not depressed. Affect is not labile, blunt, angry or inappropriate.        Speech: Speech normal.        Behavior: Behavior normal.        Thought Content: Thought content normal. Thought content does not include homicidal or suicidal ideation. Thought content does not include homicidal or suicidal plan.        Cognition and Memory: Cognition normal.        Judgment: Judgment normal.     Comments: Insight intact. No auditory or visual hallucinations. No delusions.      Lab Review:  No results found for: NA, K, CL, CO2, GLUCOSE, BUN, CREATININE, CALCIUM, PROT, ALBUMIN, AST, ALT, ALKPHOS, BILITOT, GFRNONAA, GFRAA  No results found for: WBC, RBC, HGB, HCT, PLT, MCV, MCH, MCHC, RDW, LYMPHSABS, MONOABS, EOSABS, BASOSABS  No results found for: POCLITH, LITHIUM   No results found for: PHENYTOIN, PHENOBARB, VALPROATE, CBMZ   .res Assessment: Plan:    Generalized anxiety disorder   We discussed the short-term risks associated with benzodiazepines including sedation and  increased fall risk among others.  Discussed long-term side effect risk including dependence, potential withdrawal symptoms, and the potential eventual dose-related risk of dementia.  Benefit from meds.  No complaints.  Tolerating.  Supportive therapy re business stress.  FU 6 mos  Meredith Staggersarey Cottle, MD, DFAPA  Please see After Visit Summary for patient specific instructions.  No future appointments.  No orders of the defined types were placed in this encounter.     -------------------------------

## 2018-11-02 DIAGNOSIS — Z6823 Body mass index (BMI) 23.0-23.9, adult: Secondary | ICD-10-CM | POA: Diagnosis not present

## 2018-11-02 DIAGNOSIS — R1030 Lower abdominal pain, unspecified: Secondary | ICD-10-CM | POA: Diagnosis not present

## 2018-11-02 DIAGNOSIS — R599 Enlarged lymph nodes, unspecified: Secondary | ICD-10-CM | POA: Diagnosis not present

## 2018-11-02 DIAGNOSIS — K4020 Bilateral inguinal hernia, without obstruction or gangrene, not specified as recurrent: Secondary | ICD-10-CM | POA: Diagnosis not present

## 2018-11-08 DIAGNOSIS — E1065 Type 1 diabetes mellitus with hyperglycemia: Secondary | ICD-10-CM | POA: Diagnosis not present

## 2018-11-08 DIAGNOSIS — R590 Localized enlarged lymph nodes: Secondary | ICD-10-CM | POA: Diagnosis not present

## 2018-11-09 ENCOUNTER — Other Ambulatory Visit: Payer: Self-pay | Admitting: Psychiatry

## 2018-11-27 DIAGNOSIS — R1030 Lower abdominal pain, unspecified: Secondary | ICD-10-CM | POA: Diagnosis not present

## 2018-11-27 DIAGNOSIS — K4020 Bilateral inguinal hernia, without obstruction or gangrene, not specified as recurrent: Secondary | ICD-10-CM | POA: Diagnosis not present

## 2018-12-04 DIAGNOSIS — I10 Essential (primary) hypertension: Secondary | ICD-10-CM | POA: Diagnosis not present

## 2018-12-04 DIAGNOSIS — E1042 Type 1 diabetes mellitus with diabetic polyneuropathy: Secondary | ICD-10-CM | POA: Diagnosis not present

## 2018-12-04 DIAGNOSIS — E1040 Type 1 diabetes mellitus with diabetic neuropathy, unspecified: Secondary | ICD-10-CM | POA: Diagnosis not present

## 2018-12-04 DIAGNOSIS — Z794 Long term (current) use of insulin: Secondary | ICD-10-CM | POA: Diagnosis not present

## 2018-12-04 DIAGNOSIS — E559 Vitamin D deficiency, unspecified: Secondary | ICD-10-CM | POA: Diagnosis not present

## 2019-02-09 DIAGNOSIS — E1065 Type 1 diabetes mellitus with hyperglycemia: Secondary | ICD-10-CM | POA: Diagnosis not present

## 2019-03-07 ENCOUNTER — Other Ambulatory Visit: Payer: Self-pay | Admitting: Psychiatry

## 2019-03-16 DIAGNOSIS — Z20828 Contact with and (suspected) exposure to other viral communicable diseases: Secondary | ICD-10-CM | POA: Diagnosis not present

## 2019-03-21 ENCOUNTER — Other Ambulatory Visit: Payer: Self-pay | Admitting: Endocrinology

## 2019-03-21 DIAGNOSIS — I10 Essential (primary) hypertension: Secondary | ICD-10-CM | POA: Diagnosis not present

## 2019-03-21 DIAGNOSIS — R1030 Lower abdominal pain, unspecified: Secondary | ICD-10-CM | POA: Diagnosis not present

## 2019-03-21 DIAGNOSIS — R102 Pelvic and perineal pain: Secondary | ICD-10-CM

## 2019-03-21 DIAGNOSIS — K900 Celiac disease: Secondary | ICD-10-CM | POA: Diagnosis not present

## 2019-03-22 ENCOUNTER — Other Ambulatory Visit: Payer: Self-pay

## 2019-03-22 ENCOUNTER — Ambulatory Visit
Admission: RE | Admit: 2019-03-22 | Discharge: 2019-03-22 | Disposition: A | Discharge status: Home / Self Care | Payer: BC Managed Care – PPO | Source: Ambulatory Visit | Attending: Endocrinology | Admitting: Endocrinology

## 2019-03-22 DIAGNOSIS — R102 Pelvic and perineal pain: Secondary | ICD-10-CM

## 2019-03-22 DIAGNOSIS — N3289 Other specified disorders of bladder: Secondary | ICD-10-CM | POA: Diagnosis not present

## 2019-03-22 DIAGNOSIS — N400 Enlarged prostate without lower urinary tract symptoms: Secondary | ICD-10-CM | POA: Diagnosis not present

## 2019-03-22 MED ORDER — IOPAMIDOL (ISOVUE-300) INJECTION 61%
100.0000 mL | Freq: Once | INTRAVENOUS | Status: AC | PRN
  Administered 2019-03-22: 100 mL via INTRAVENOUS

## 2019-03-26 ENCOUNTER — Ambulatory Visit: Payer: BLUE CROSS/BLUE SHIELD | Admitting: Psychiatry

## 2019-04-03 DIAGNOSIS — E1142 Type 2 diabetes mellitus with diabetic polyneuropathy: Secondary | ICD-10-CM | POA: Diagnosis not present

## 2019-04-04 DIAGNOSIS — K900 Celiac disease: Secondary | ICD-10-CM | POA: Diagnosis not present

## 2019-04-04 DIAGNOSIS — E1040 Type 1 diabetes mellitus with diabetic neuropathy, unspecified: Secondary | ICD-10-CM | POA: Diagnosis not present

## 2019-04-04 DIAGNOSIS — E1042 Type 1 diabetes mellitus with diabetic polyneuropathy: Secondary | ICD-10-CM | POA: Diagnosis not present

## 2019-04-04 DIAGNOSIS — Z794 Long term (current) use of insulin: Secondary | ICD-10-CM | POA: Diagnosis not present

## 2019-04-09 DIAGNOSIS — R350 Frequency of micturition: Secondary | ICD-10-CM | POA: Diagnosis not present

## 2019-04-09 DIAGNOSIS — R109 Unspecified abdominal pain: Secondary | ICD-10-CM | POA: Diagnosis not present

## 2019-04-09 DIAGNOSIS — Z125 Encounter for screening for malignant neoplasm of prostate: Secondary | ICD-10-CM | POA: Diagnosis not present

## 2019-04-09 DIAGNOSIS — N401 Enlarged prostate with lower urinary tract symptoms: Secondary | ICD-10-CM | POA: Diagnosis not present

## 2019-04-17 DIAGNOSIS — Z20828 Contact with and (suspected) exposure to other viral communicable diseases: Secondary | ICD-10-CM | POA: Diagnosis not present

## 2019-04-19 ENCOUNTER — Other Ambulatory Visit: Payer: Self-pay

## 2019-04-19 ENCOUNTER — Encounter: Payer: Self-pay | Admitting: Psychiatry

## 2019-04-19 ENCOUNTER — Ambulatory Visit (INDEPENDENT_AMBULATORY_CARE_PROVIDER_SITE_OTHER): Payer: BC Managed Care – PPO | Admitting: Psychiatry

## 2019-04-19 DIAGNOSIS — F411 Generalized anxiety disorder: Secondary | ICD-10-CM | POA: Diagnosis not present

## 2019-04-19 MED ORDER — SERTRALINE HCL 50 MG PO TABS: 50.0000 mg | ORAL_TABLET | Freq: Every day | ORAL | 6 refills | Status: DC

## 2019-04-19 MED ORDER — ALPRAZOLAM 1 MG PO TABS: ORAL_TABLET | ORAL | 5 refills | Status: DC

## 2019-04-19 NOTE — Progress Notes (Signed)
Kyle CarmineJerry Medaglia [id number] 05/13/1956 63 y.o.  Subjective:   Patient ID:  Kyle Velazquez is a 63 y.o. (DOB 04/17/1956) male.  Chief Complaint:  Chief Complaint  Patient presents with  . Follow-up    Medication Management  . Anxiety    Medication Management    HPI Kyle CarmineJerry Waidelich presents to the office today for follow-up of GAD.  Last seen January 2020.  No meds were changed.  Has managed Covid.  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   His business hurt bad by Covid also.  PPE helped them out.  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  Would be a great relief.  Bank is approved.  GD just turned 63 yo and they moved to [place].  Son doing really well with big promotion.  D in [place].    No other complaints.  Doesn't have control over the time line.  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  Satisfied with meds.    Anxiety manageable.  Chronic issues.  Does well with the meds.  Aware of risks the Bz.    Past Psychiatric Medication Trials: Sertraline, Lexapro, buspirone, Xanax with a long history of 1 mg 4 times daily.  He has been able to reduce it to half a milligram 5 times daily Under my psychiatric care since 2002  Review of Systems:  Review of Systems  Neurological: Negative for tremors and weakness.  Psychiatric/Behavioral: Negative for agitation, behavioral problems, confusion, decreased concentration, dysphoric mood, hallucinations, self-injury, sleep disturbance and suicidal ideas. The patient is nervous/anxious. The patient is not hyperactive.     Medications: I have reviewed the patient's current medications.  Current Outpatient Medications  Medication Sig Dispense Refill  . ALPRAZolam (XANAX) 1 MG tablet TAKE 1/2 TABLET BY MOUTH 5 TIMES A DAY 75 tablet 5  . aspirin 81 MG chewable tablet Chew 81 mg by mouth daily.    . Continuous Blood Gluc Sensor (FREESTYLE LIBRE 14 DAY SENSOR) MISC by Does not apply route.    Marland Kitchen. glucose  blood test strip 1 each by Other route as needed for other. Use as instructed    . insulin aspart (NOVOLOG) 100 UNIT/ML injection Inject into the skin 3 (three) times daily before meals. INJECT UP TO 70 UNITS DAILY ON A SLIDING SCALE    . PERCOCET 10-325 MG tablet Take 1 tablet by mouth every 6 (six) hours as needed. AS NEEDED FOR PAIN  0  . ramipril (ALTACE) 5 MG capsule Take 5 mg by mouth daily.    . sertraline (ZOLOFT) 50 MG tablet TAKE 1 TABLET BY MOUTH ONCE DAILY 30 tablet 2  . valACYclovir (VALTREX) 500 MG tablet Take 500 mg by mouth daily.    Marland Kitchen. VIAGRA 100 MG tablet Take 100 mg by mouth daily as needed. AS NEEDED FOR ED  0  . Vitamin D, Ergocalciferol, (DRISDOL) 50000 units CAPS capsule Take 50,000 Units by mouth every 7 (seven) days.     No current facility-administered medications for this visit.     Medication Side Effects: None; no sedation.  Allergies: No Known Allergies  Past Medical History:  Diagnosis Date  . Diabetic neuropathy (HCC)   . Hyperlipidemia   . Hypertension     Family History  Problem Relation Age of Onset  . CAD Mother   . CAD Father     Social History   Socioeconomic History  . Marital status: Married    Spouse name:  Not on file  . Number of children: Not on file  . Years of education: Not on file  . Highest education level: Not on file  Occupational History  . Not on file  Social Needs  . Financial resource strain: Not on file  . Food insecurity    Worry: Not on file    Inability: Not on file  . Transportation needs    Medical: Not on file    Non-medical: Not on file  Tobacco Use  . Smoking status: Former Games developermoker  . Smokeless tobacco: Never Used  Substance and Sexual Activity  . Alcohol use: No  . Drug use: No  . Sexual activity: Yes  Lifestyle  . Physical activity    Days per week: Not on file    Minutes per session: Not on file  . Stress: Not on file  Relationships  . Social Musicianconnections    Talks on phone: Not on file    Gets  together: Not on file    Attends religious service: Not on file    Active member of club or organization: Not on file    Attends meetings of clubs or organizations: Not on file    Relationship status: Not on file  . Intimate partner violence    Fear of current or ex partner: Not on file    Emotionally abused: Not on file    Physically abused: Not on file    Forced sexual activity: Not on file  Other Topics Concern  . Not on file  Social History Narrative  . Not on file    Past Medical History, Surgical history, Social history, and Family history were reviewed and updated as appropriate.   Please see review of systems for further details on the patient's review from today.   Objective:   Physical Exam:  There were no vitals taken for this visit.  Physical Exam Constitutional:      General: He is not in acute distress.    Appearance: He is well-developed.  Musculoskeletal:        General: No deformity.  Neurological:     Mental Status: He is alert and oriented to person, place, and time.     Motor: No tremor.     Coordination: Coordination normal.     Gait: Gait normal.  Psychiatric:        Attention and Perception: Attention and perception normal.        Mood and Affect: Mood is anxious. Mood is not depressed. Affect is not labile, blunt, angry or inappropriate.        Speech: Speech normal.        Behavior: Behavior normal.        Thought Content: Thought content normal. Thought content does not include homicidal or suicidal ideation. Thought content does not include homicidal or suicidal plan.        Cognition and Memory: Cognition normal.        Judgment: Judgment normal.     Comments: Insight intact. No auditory or visual hallucinations. No delusions.      Lab Review:  No results found for: NA, K, CL, CO2, GLUCOSE, BUN, CREATININE, CALCIUM, PROT, ALBUMIN, AST, ALT, ALKPHOS, BILITOT, GFRNONAA, GFRAA  No results found for: WBC, RBC, HGB, HCT, PLT, MCV, MCH, MCHC,  RDW, LYMPHSABS, MONOABS, EOSABS, BASOSABS  No results found for: POCLITH, LITHIUM   No results found for: PHENYTOIN, PHENOBARB, VALPROATE, CBMZ   .res Assessment: Plan:    Dorene SorrowJerry was seen today  for follow-up and anxiety.  Diagnoses and all orders for this visit:  Generalized anxiety disorder  Disc stress Covid.    We discussed the short-term risks associated with benzodiazepines including sedation and increased fall risk among others.  Discussed long-term side effect risk including dependence, potential withdrawal symptoms, and the potential eventual dose-related risk of dementia.  Benefit from meds.  No complaints.  Tolerating.  Supportive therapy re business stress.  FU 6 mos  Lynder Parents, MD, DFAPA   Please see After Visit Summary for patient specific instructions.  No future appointments.  No orders of the defined types were placed in this encounter.     -------------------------------

## 2019-05-09 ENCOUNTER — Other Ambulatory Visit: Payer: Self-pay | Admitting: *Deleted

## 2019-05-09 DIAGNOSIS — J09X2 Influenza due to identified novel influenza A virus with other respiratory manifestations: Secondary | ICD-10-CM

## 2019-05-10 DIAGNOSIS — E1065 Type 1 diabetes mellitus with hyperglycemia: Secondary | ICD-10-CM | POA: Diagnosis not present

## 2019-05-10 LAB — NOVEL CORONAVIRUS, NAA: SARS-CoV-2, NAA: NOT DETECTED

## 2019-05-15 ENCOUNTER — Telehealth: Payer: Self-pay | Admitting: Cardiovascular Disease

## 2019-05-15 NOTE — Telephone Encounter (Signed)
Virtual Visit Pre-Appointment Phone Call  "(Name), I am calling you today to discuss your upcoming appointment. We are currently trying to limit exposure to the virus that causes COVID-19 by seeing patients at home rather than in the office."  1. "What is the BEST phone number to call the day of the visit?" - include this in appointment notes  2. "Do you have or have access to (through a family member/friend) a smartphone with video capability that we can use for your visit?" a. If yes - list this number in appt notes as "cell" (if different from BEST phone #) and list the appointment type as a VIDEO visit in appointment notes b. If no - list the appointment type as a PHONE visit in appointment notes  3. Confirm consent - "In the setting of the current Covid19 crisis, you are scheduled for a (phone or video) visit with your provider on (date) at (time).  Just as we do with many in-office visits, in order for you to participate in this visit, we must obtain consent.  If you'd like, I can send this to your mychart (if signed up) or email for you to review.  Otherwise, I can obtain your verbal consent now.  All virtual visits are billed to your insurance company just like a normal visit would be.  By agreeing to a virtual visit, we'd like you to understand that the technology does not allow for your provider to perform an examination, and thus may limit your provider's ability to fully assess your condition. If your provider identifies any concerns that need to be evaluated in person, we will make arrangements to do so.  Finally, though the technology is pretty good, we cannot assure that it will always work on either your or our end, and in the setting of a video visit, we may have to convert it to a phone-only visit.  In either situation, we cannot ensure that we have a secure connection.  Are you willing to proceed?" STAFF: Did the patient verbally acknowledge consent to telehealth visit? Document  YES/NO here: YES  4. Advise patient to be prepared - "Two hours prior to your appointment, go ahead and check your blood pressure, pulse, oxygen saturation, and your weight (if you have the equipment to check those) and write them all down. When your visit starts, your provider will ask you for this information. If you have an Apple Watch or Kardia device, please plan to have heart rate information ready on the day of your appointment. Please have a pen and paper handy nearby the day of the visit as well."  5. Give patient instructions for MyChart download to smartphone OR Doximity/Doxy.me as below if video visit (depending on what platform provider is using)  6. Inform patient they will receive a phone call 15 minutes prior to their appointment time (may be from unknown caller ID) so they should be prepared to answer    TELEPHONE CALL NOTE  Kyle Velazquez has been deemed a candidate for a follow-up tele-health visit to limit community exposure during the Covid-19 pandemic. I spoke with the patient via phone to ensure availability of phone/video source, confirm preferred email & phone number, and discuss instructions and expectations.  I reminded Kyle Velazquez to be prepared with any vital sign and/or heart rhythm information that could potentially be obtained via home monitoring, at the time of his visit. I reminded Kyle Velazquez to expect a phone call prior to his visit.  Kyle Velazquez 05/15/2019 3:19 PM   INSTRUCTIONS FOR DOWNLOADING THE MYCHART APP TO SMARTPHONE  - The patient must first make sure to have activated MyChart and know their login information - If Apple, go to [employer] and type in MyChart in the search bar and download the app. If Android, ask patient to go to [employer] and type in [place] in the search bar and download the app. The app is free but as with any other app downloads, their phone may require them to verify saved payment information or Apple/Android  password.  - The patient will need to then log into the app with their MyChart username and password, and select [facility] as their healthcare provider to link the account. When it is time for your visit, go to the MyChart app, find appointments, and click Begin Video Visit. Be sure to Select Allow for your device to access the Microphone and Camera for your visit. You will then be connected, and your provider will be with you shortly.may   **If they have any issues connecting, or need assistance please contact MyChart service desk [phone])**  **If using a computer, in order to ensure the best quality for their visit they will need to use either of the following Internet Browsers: [employer], or Google Chrome**  IF USING DOXIMITY or DOXY.ME - The patient will receive a link just prior to their visit by text.     FULL LENGTH CONSENT FOR TELE-HEALTH VISIT   I hereby voluntarily request, consent and authorize [facility] and its employed or contracted physicians, physician assistants, nurse practitioners or other licensed health care professionals (the Practitioner), to provide me with telemedicine health care services (the "Services") as deemed necessary by the treating Practitioner. I acknowledge and consent to receive the Services by the Practitioner via telemedicine. I understand that the telemedicine visit will involve communicating with the Practitioner through live audiovisual communication technology and the disclosure of certain medical information by electronic transmission. I acknowledge that I have been given the opportunity to request an in-person assessment or other available alternative prior to the telemedicine visit and am voluntarily participating in the telemedicine visit.  I understand that I have the right to withhold or withdraw my consent to the use of telemedicine in the course of my care at any time, without affecting my right to future care or  treatment, and that the Practitioner or I may terminate the telemedicine visit at any time. I understand that I have the right to inspect all information obtained and/or recorded in the course of the telemedicine visit and receive copies of available information for a reasonable fee.  I understand that some of the potential risks of receiving the Services via telemedicine include:  Marland Kitchen Delay or interruption in medical evaluation due to technological equipment failure or disruption; . Information transmitted may not be sufficient (e.g. poor resolution of images) to allow for appropriate medical decision making by the Practitioner; and/or  . In rare instances, security protocols could fail, causing a breach of personal health information.  Furthermore, I acknowledge that it is my responsibility to provide information about my medical history, conditions and care that is complete and accurate to the best of my ability. I acknowledge that Practitioner's advice, recommendations, and/or decision may be based on factors not within their control, such as incomplete or inaccurate data provided by me or distortions of diagnostic images or specimens that may result from electronic transmissions. I understand that the practice  of medicine is not an Visual merchandiserexact science and that Practitioner makes no warranties or guarantees regarding treatment outcomes. I acknowledge that I will receive a copy of this consent concurrently upon execution via email to the email address I last provided but may also request a printed copy by calling the office of [facility].    I understand that my insurance will be billed for this visit.   I have read or had this consent read to me. . I understand the contents of this consent, which adequately explains the benefits and risks of the Services being provided via telemedicine.  . I have been provided ample opportunity to ask questions regarding this consent and the Services and have had my  questions answered to my satisfaction. . I give my informed consent for the services to be provided through the use of telemedicine in my medical care  By participating in this telemedicine visit I agree to the above.

## 2019-05-16 ENCOUNTER — Telehealth: Payer: Self-pay

## 2019-05-16 NOTE — Telephone Encounter (Signed)
I called and left patient a message to review medications for appointment Friday with Dr. Acie Fredrickson.

## 2019-05-17 NOTE — Telephone Encounter (Signed)
I tried to call patient again to review medications, he did not answer.

## 2019-05-18 ENCOUNTER — Encounter: Payer: Self-pay | Admitting: Cardiovascular Disease

## 2019-05-18 ENCOUNTER — Other Ambulatory Visit: Payer: Self-pay

## 2019-05-18 ENCOUNTER — Telehealth (INDEPENDENT_AMBULATORY_CARE_PROVIDER_SITE_OTHER): Payer: BC Managed Care – PPO | Admitting: Cardiovascular Disease

## 2019-05-18 VITALS — BP 136/65 | HR 49 | Ht 66.0 in | Wt 143.0 lb

## 2019-05-18 DIAGNOSIS — E785 Hyperlipidemia, unspecified: Secondary | ICD-10-CM | POA: Diagnosis not present

## 2019-05-18 DIAGNOSIS — I10 Essential (primary) hypertension: Secondary | ICD-10-CM | POA: Diagnosis not present

## 2019-05-18 DIAGNOSIS — Z7189 Other specified counseling: Secondary | ICD-10-CM

## 2019-05-18 MED ORDER — HYDROCHLOROTHIAZIDE 25 MG PO TABS: 25.0000 mg | ORAL_TABLET | Freq: Every day | ORAL | 3 refills | Status: DC

## 2019-05-18 MED ORDER — POTASSIUM CHLORIDE CRYS ER 20 MEQ PO TBCR: 20.0000 meq | EXTENDED_RELEASE_TABLET | Freq: Every day | ORAL | 3 refills | Status: DC

## 2019-05-18 NOTE — Progress Notes (Signed)
Virtual Visit via Telephone Note   This visit type was conducted due to national recommendations for restrictions regarding the COVID-19 Pandemic (e.g. social distancing) in an effort to limit this patient's exposure and mitigate transmission in our community.  Due to his co-morbid illnesses, this patient is at least at moderate risk for complications without adequate follow up.  This format is felt to be most appropriate for this patient at this time.  The patient did not have access to video technology/had technical difficulties with video requiring transitioning to audio format only (telephone).  All issues noted in this document were discussed and addressed.  No physical exam could be performed with this format.  Please refer to the patient's chart for his  consent to telehealth for [facility].   Date:  05/18/2019   ID:  [name], DOB [birth year], MRN [id number]  Patient Location: Home Provider Location: Home  PCP:  Reynold Bowen, MD  Cardiologist:   Electrophysiologist:  None   Problem list 1. Chest discomfort 2. Diabetes mellitus type 1 -with diabetic nephropathy and diabetic neuropathy 3. Essential hypertension 4. Hyperlipidemia   Previous Notes: Abrahim Sargent is a 63 y.o. male who presents for Further evaluation of his chest pain. Has type 1 DM.  Has had chest pain ever since he had neck  surgery, FEb. 2017   Chest pain - not worsened with exertion .   Occurs if he twists his neck or torso.   No associated dyspnea.  Has not had a stress test in the past 10 years.   He works out every day .   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 4, 2020     Zakhari Fogel is a 63 y.o. male with history of atypical chest pain.  I saw him in July, 2017 for atypical chest pain.  His symptoms were not related to exertion.  He does have coronary artery risk factor so we were to see  him back in 1 year.  He is now seen virtually via telemedicine visit.  Is not having any cardiac issues. BP has been a bit elevated.  He exercises daily  30-40 min a day , 7 days a week.  BP has been variable.   Normal in am.  Sometimes 180s at night Eats soup frequently . Takes percocet Q 4 hours for chronic back pain issue.   Goes to pain clinic     The patient does not have symptoms concerning for COVID-19 infection (fever, chills, cough, or new shortness of breath).    Past Medical History:  Diagnosis Date  . Diabetic neuropathy (Thermal)   . Hyperlipidemia   . Hypertension    Past Surgical History:  Procedure Laterality Date  . SHOULDER SURGERY  2009     Current Meds  Medication Sig  . ALPRAZolam (XANAX) 1 MG tablet TAKE 1/2 TABLET BY MOUTH 5 TIMES A DAY  . amLODipine (NORVASC) 10 MG tablet Take 10 mg by mouth daily.  . Continuous Blood Gluc Sensor (FREESTYLE LIBRE 14 DAY SENSOR) MISC by Does not apply route.  Marland Kitchen glucose blood test strip 1 each by Other route as needed for other. Use as instructed  . insulin aspart (NOVOLOG) 100 UNIT/ML injection Inject into the skin 3 (three) times daily before meals. INJECT UP TO 70 UNITS DAILY ON A SLIDING SCALE  . PERCOCET 10-325 MG tablet Take 1 tablet by mouth  every 6 (six) hours as needed. AS NEEDED FOR PAIN  . ramipril (ALTACE) 5 MG capsule Take 5 mg by mouth daily.  . sertraline (ZOLOFT) 50 MG tablet Take 25 mg by mouth daily.  . valACYclovir (VALTREX) 500 MG tablet Take 500 mg by mouth daily.     Allergies:   Patient has no known allergies.   Social History   Tobacco Use  . Smoking status: Former Games developermoker  . Smokeless tobacco: Never Used  Substance Use Topics  . Alcohol use: No  . Drug use: No     Family Hx: The patient's family history includes CAD in his father and mother.  ROS:   Please see the history of present illness.     All other systems reviewed and are negative.   Prior CV studies:   The following  studies were reviewed today:    Labs/Other Tests and Data Reviewed:    EKG:    Recent Labs: No results found for requested labs within last 8760 hours.   Recent Lipid Panel No results found for: CHOL, TRIG, HDL, CHOLHDL, LDLCALC, LDLDIRECT  Wt Readings from Last 3 Encounters:  05/18/19 143 lb (64.9 kg)  03/19/16 166 lb (75.3 kg)     Objective:    Vital Signs:  BP 136/65 (BP Location: Left Arm, Patient Position: Sitting, Cuff Size: Normal)   Pulse (!) 49   Ht 5\' 6"  (1.676 m)   Wt 143 lb (64.9 kg)   BMI 23.08 kg/m      ASSESSMENT & PLAN:    1. HTN:   BP has been elevated.   Will add HCTZ 25 mg a day and potassium chloride 20 mEq a day.  We will check a basic metabolic profile in 3 weeks.  I will see him back  in 6 months for follow-up visit.  2.  Atypical chest pain: He is not having any further episodes of chest discomfort.  COVID-19 Education: The signs and symptoms of COVID-19 were discussed with the patient and how to seek care for testing (follow up with PCP or arrange E-visit).  The importance of social distancing was discussed today.  Time:   Today, I have spent  19 minutes with the patient with telehealth technology discussing the above problems.     Medication Adjustments/Labs and Tests Ordered: Current medicines are reviewed at length with the patient today.  Concerns regarding medicines are outlined above.   Tests Ordered: No orders of the defined types were placed in this encounter.   Medication Changes: No orders of the defined types were placed in this encounter.   Follow Up:  Virtual Visit or In Person in 6 month(s)  Signed, Kristeen MissPhilip Nahser, MD  05/18/2019 9:30 AM    [facility]

## 2019-05-18 NOTE — Patient Instructions (Addendum)
Medication Instructions:  Your physician has recommended you make the following change in your medication:  START Hydrochlorothiazide (HCTZ) 25 mg once daily in the morning START Kdur (potassium chloride) 20 mEq once daily in the morning  If you need a refill on your cardiac medications before your next appointment, please call your pharmacy.    Lab work: Your physician recommends that you return for lab work in: 3 weeks on for basic metabolic panel on Sept. 24 You do not have to fast for your lab appointment You may come to the office anytime on this date between the hours of 7:30 am and 4:45 pm   Testing/Procedures: None Ordered   Follow-Up: At [facility], you and your health needs are our priority.  As part of our continuing mission to provide you with exceptional heart care, we have created designated Provider Care Teams.  These Care Teams include your primary Cardiologist (physician) and Advanced Practice Providers (APPs -  Physician Assistants and Nurse Practitioners) who all work together to provide you with the care you need, when you need it. You will need a follow up appointment in:  6 months.  Please call our office 2 months in advance to schedule this appointment.  You may see Dr. Acie Fredrickson or one of the following Advanced Practice Providers on your designated Care Team: Richardson Dopp, PA-C [place], [place] . Daune Perch, NP

## 2019-06-07 ENCOUNTER — Other Ambulatory Visit: Payer: BC Managed Care – PPO

## 2019-06-11 ENCOUNTER — Other Ambulatory Visit: Payer: BC Managed Care – PPO | Admitting: *Deleted

## 2019-06-11 ENCOUNTER — Other Ambulatory Visit: Payer: Self-pay

## 2019-06-11 DIAGNOSIS — E785 Hyperlipidemia, unspecified: Secondary | ICD-10-CM

## 2019-06-11 DIAGNOSIS — I10 Essential (primary) hypertension: Secondary | ICD-10-CM

## 2019-06-11 LAB — BASIC METABOLIC PANEL
BUN/Creatinine Ratio: 17 (ref 10–24)
BUN: 13 mg/dL (ref 8–27)
CO2: 25 mmol/L (ref 20–29)
Calcium: 10 mg/dL (ref 8.6–10.2)
Chloride: 95 mmol/L — ABNORMAL LOW (ref 96–106)
Creatinine, Ser: 0.76 mg/dL (ref 0.76–1.27)
GFR calc Af Amer: 111 mL/min/{1.73_m2} (ref >59)
GFR calc non Af Amer: 96 mL/min/{1.73_m2} (ref >59)
Glucose: 223 mg/dL — ABNORMAL HIGH (ref 65–99)
Potassium: 4.7 mmol/L (ref 3.5–5.2)
Sodium: 134 mmol/L (ref 134–144)

## 2019-06-12 DIAGNOSIS — F419 Anxiety disorder, unspecified: Secondary | ICD-10-CM | POA: Diagnosis not present

## 2019-06-12 DIAGNOSIS — K900 Celiac disease: Secondary | ICD-10-CM | POA: Diagnosis not present

## 2019-06-12 DIAGNOSIS — R634 Abnormal weight loss: Secondary | ICD-10-CM | POA: Diagnosis not present

## 2019-06-12 DIAGNOSIS — E1042 Type 1 diabetes mellitus with diabetic polyneuropathy: Secondary | ICD-10-CM | POA: Diagnosis not present

## 2019-06-12 DIAGNOSIS — E785 Hyperlipidemia, unspecified: Secondary | ICD-10-CM | POA: Diagnosis not present

## 2019-06-12 DIAGNOSIS — E871 Hypo-osmolality and hyponatremia: Secondary | ICD-10-CM | POA: Diagnosis not present

## 2019-06-24 IMAGING — RF DG ESOPHAGUS
8 series · 14 of 24 positions shown · non-contrast
Comparison: None.

CLINICAL DATA: Dysphagia since cervical spine surgery in 0992

EXAM:
ESOPHOGRAM/BARIUM SWALLOW
TECHNIQUE: Combined double contrast and single contrast examination performed
using effervescent crystals, thick barium liquid, and thin barium
liquid.
FLUOROSCOPY TIME:  Fluoroscopy Time:  1 minutes and 48 seconds
Radiation Exposure Index (if provided by the fluoroscopic device):
58 mGy
Number of Acquired Spot Images: 0

[Series 1: sequence · 2 of 10 frames shown (1 of 6)]
[frame 2/10]
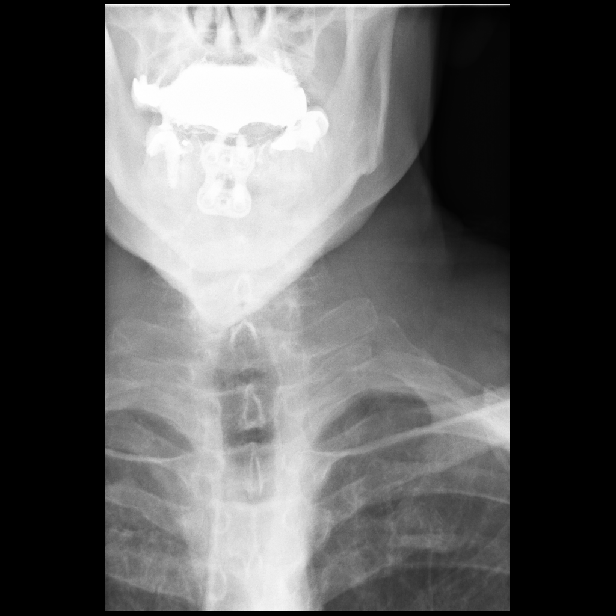
[frame 9/10]
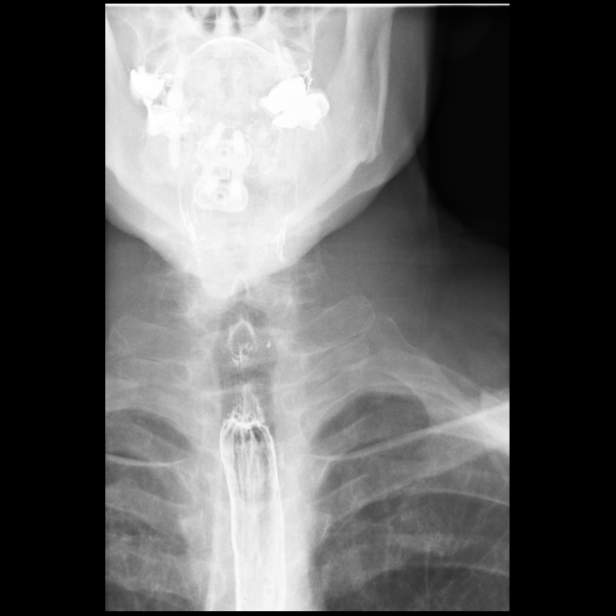

[Series 2: sequence · 1 of 12 frames shown (2 of 6)]
[frame 11/12]
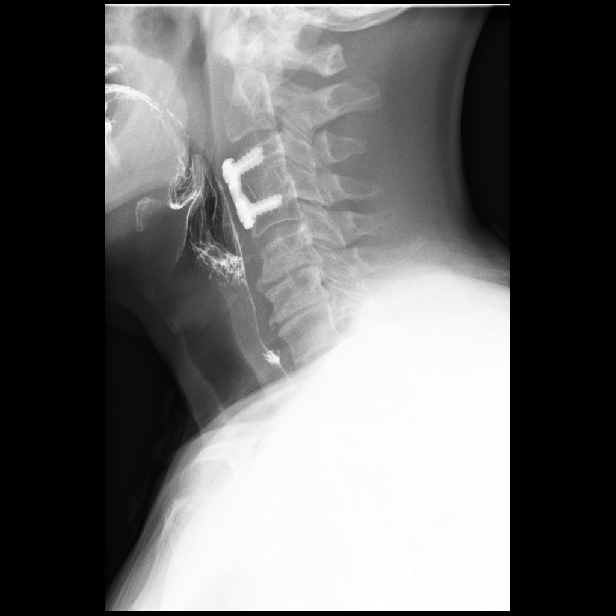

[Series 3: sequence · 2 of 11 frames shown (3 of 6)]
[frame 2/11]
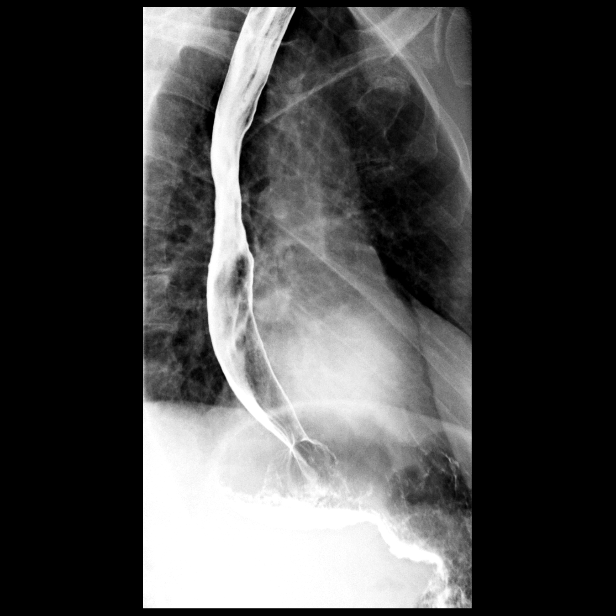
[frame 6/11]
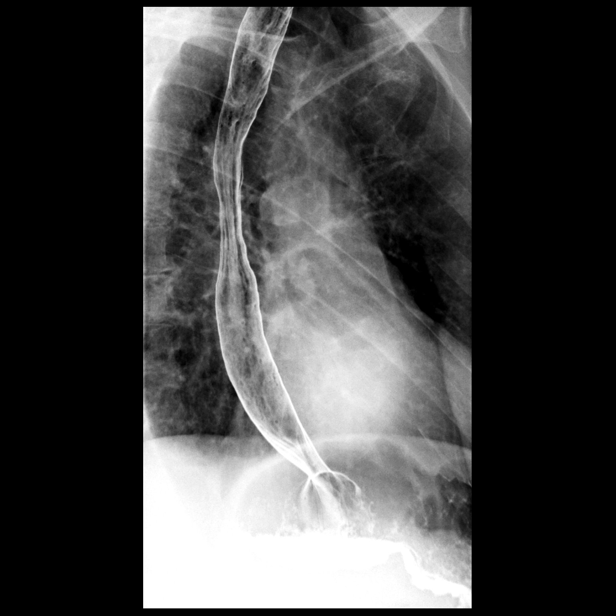

[Series 4: one shot · 1 of 2 slices shown (1 of 2)]
[im 1/2]
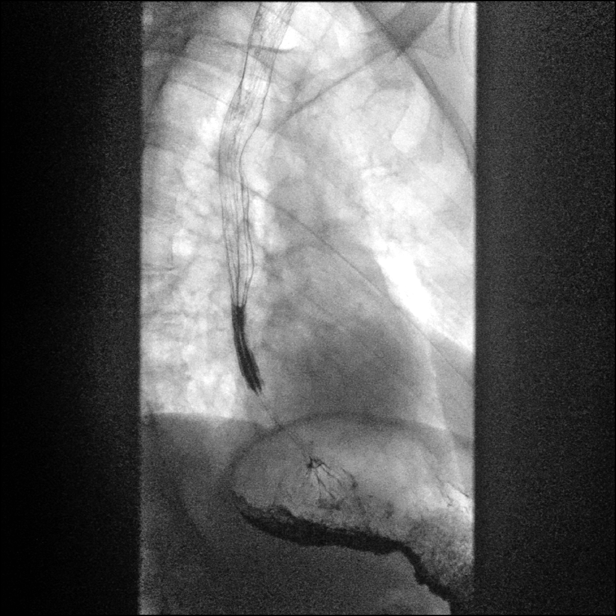

[Series 5: sequence · 3 of 15 frames shown (4 of 6)]
[frame 3/15]
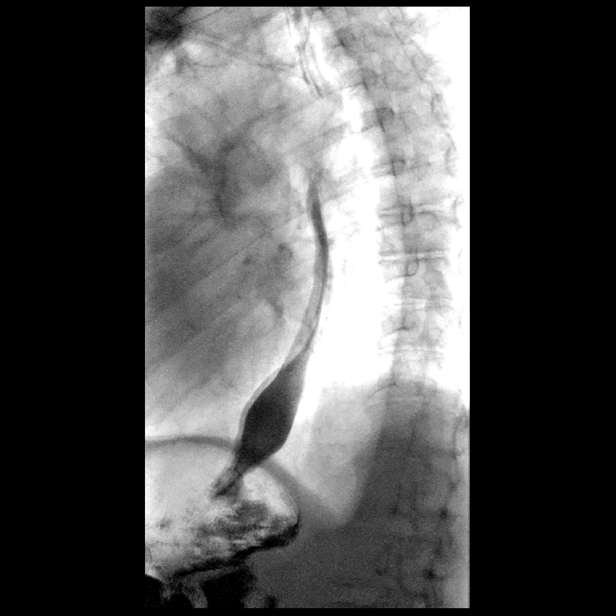
[frame 8/15]
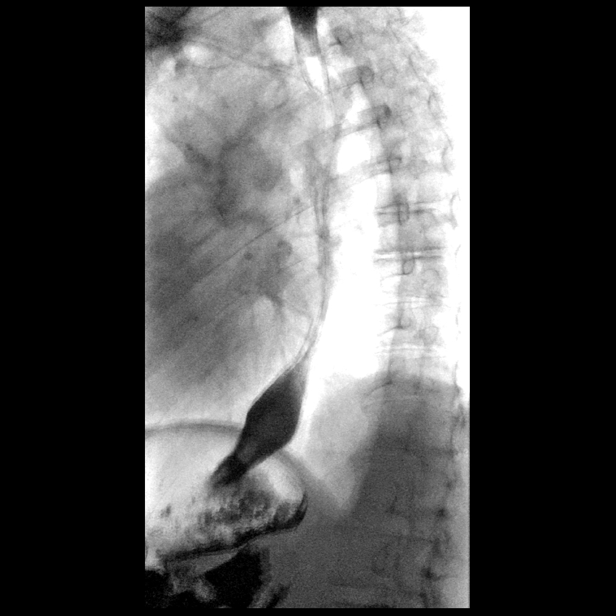
[frame 13/15]
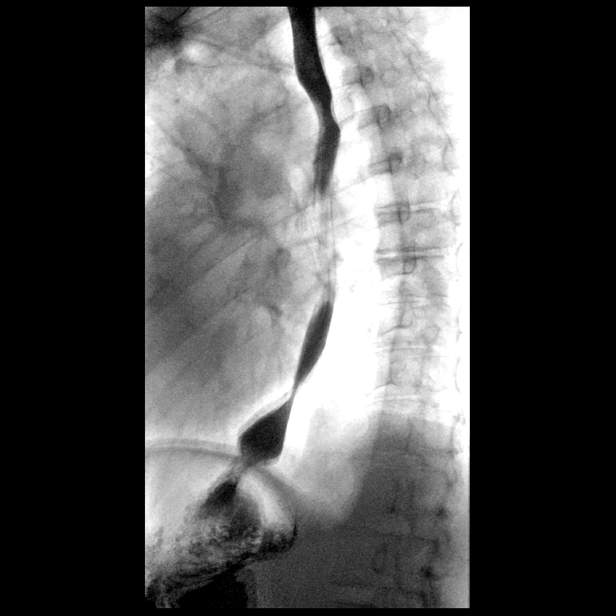

[Series 6: sequence · 1 of 13 frames shown (5 of 6)]
[frame 7/13]
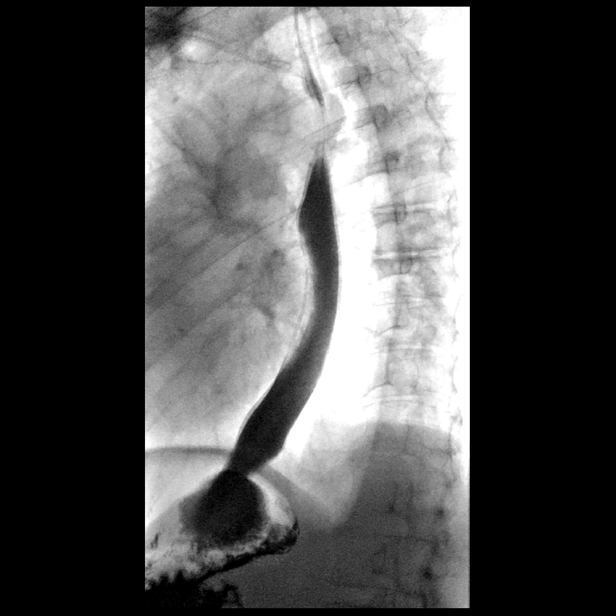

[Series 7: sequence · 2 of 16 frames shown (6 of 6)]
[frame 3/16]
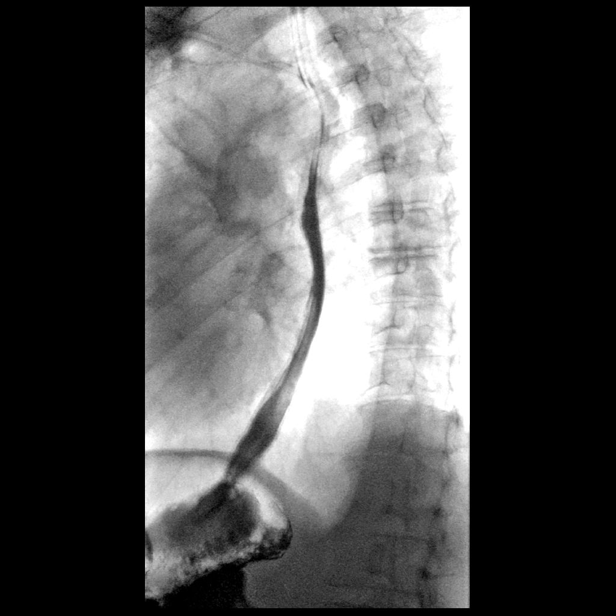
[frame 9/16]
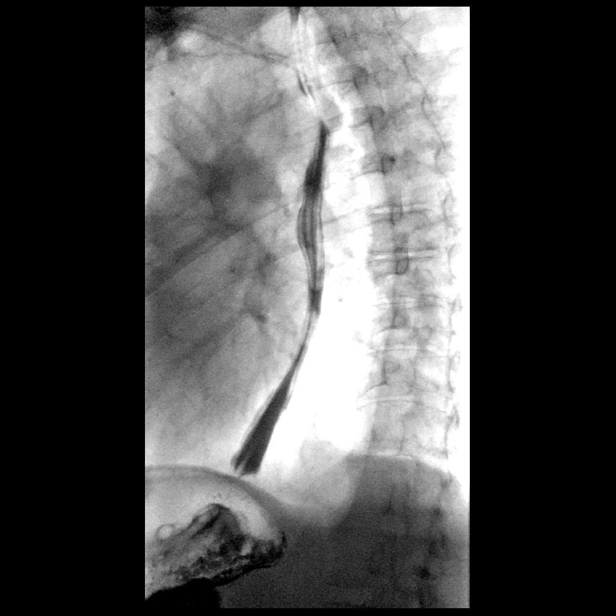

[Series 8: one shot · 2 of 3 slices shown (2 of 2)]
[im 1/3]
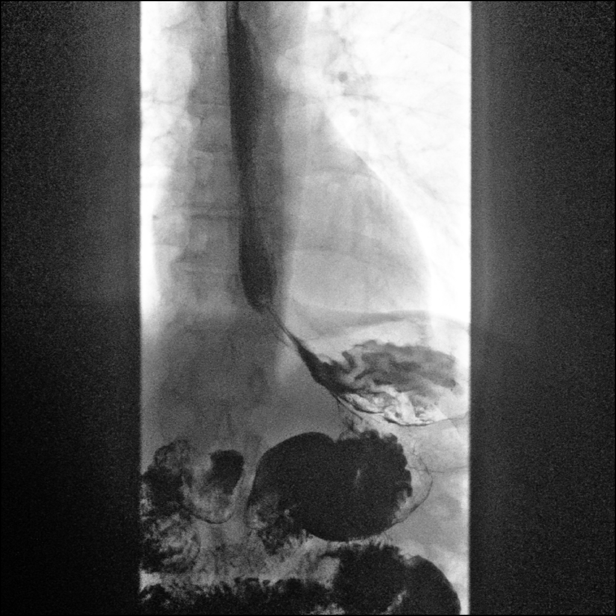
[im 3/3]
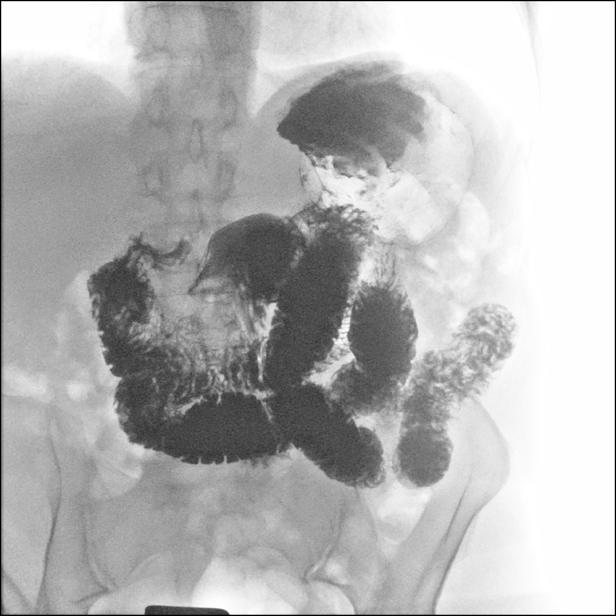

[14 of 24 positions shown; findings below may reference images not displayed]

FINDINGS: Initial barium swallows demonstrate normal pharyngeal motion with
swallowing. No laryngeal penetration or aspiration. No upper
esophageal webs, strictures or diverticuli. Anterior and interbody
fusion changes at C3-4. No significant mass effect on the upper
cervical esophagus. Moderate spurring changes are noted anteriorly
at C5-6 and C6-7 with mild impression on the esophagus.

Normal esophageal motility. No intrinsic or extrinsic lesions of the
esophagus are identified. There is a small sliding-type hiatal
hernia and inducible GE reflux with water swallowing.

The 13 mm barium pill passed into the stomach without difficulty.
IMPRESSION: 1. Normal esophageal motility.
2. Small sliding-type hiatal hernia and inducible GE reflux with
water swallowing.
3. No mass or stricture.

## 2019-07-11 ENCOUNTER — Other Ambulatory Visit: Payer: Self-pay

## 2019-07-11 DIAGNOSIS — K900 Celiac disease: Secondary | ICD-10-CM | POA: Diagnosis not present

## 2019-07-11 DIAGNOSIS — J312 Chronic pharyngitis: Secondary | ICD-10-CM | POA: Diagnosis not present

## 2019-07-11 DIAGNOSIS — Z20822 Contact with and (suspected) exposure to covid-19: Secondary | ICD-10-CM

## 2019-07-12 LAB — NOVEL CORONAVIRUS, NAA: SARS-CoV-2, NAA: NOT DETECTED

## 2019-07-25 DIAGNOSIS — J312 Chronic pharyngitis: Secondary | ICD-10-CM | POA: Insufficient documentation

## 2019-07-25 DIAGNOSIS — Z8739 Personal history of other diseases of the musculoskeletal system and connective tissue: Secondary | ICD-10-CM | POA: Diagnosis not present

## 2019-07-25 DIAGNOSIS — E109 Type 1 diabetes mellitus without complications: Secondary | ICD-10-CM | POA: Diagnosis not present

## 2019-07-25 DIAGNOSIS — K219 Gastro-esophageal reflux disease without esophagitis: Secondary | ICD-10-CM | POA: Diagnosis not present

## 2019-07-25 DIAGNOSIS — R1314 Dysphagia, pharyngoesophageal phase: Secondary | ICD-10-CM | POA: Diagnosis not present

## 2019-08-13 DIAGNOSIS — E1042 Type 1 diabetes mellitus with diabetic polyneuropathy: Secondary | ICD-10-CM | POA: Diagnosis not present

## 2019-08-13 DIAGNOSIS — E871 Hypo-osmolality and hyponatremia: Secondary | ICD-10-CM | POA: Diagnosis not present

## 2019-08-13 DIAGNOSIS — K900 Celiac disease: Secondary | ICD-10-CM | POA: Diagnosis not present

## 2019-08-13 DIAGNOSIS — I10 Essential (primary) hypertension: Secondary | ICD-10-CM | POA: Diagnosis not present

## 2019-08-15 DIAGNOSIS — E1065 Type 1 diabetes mellitus with hyperglycemia: Secondary | ICD-10-CM | POA: Diagnosis not present

## 2019-09-25 DIAGNOSIS — K219 Gastro-esophageal reflux disease without esophagitis: Secondary | ICD-10-CM | POA: Diagnosis not present

## 2019-09-25 DIAGNOSIS — R1314 Dysphagia, pharyngoesophageal phase: Secondary | ICD-10-CM | POA: Diagnosis not present

## 2019-09-25 DIAGNOSIS — Z87891 Personal history of nicotine dependence: Secondary | ICD-10-CM | POA: Diagnosis not present

## 2019-10-02 ENCOUNTER — Ambulatory Visit: Payer: BC Managed Care – PPO | Attending: Internal Medicine

## 2019-10-02 DIAGNOSIS — Z23 Encounter for immunization: Secondary | ICD-10-CM | POA: Insufficient documentation

## 2019-10-02 NOTE — Progress Notes (Signed)
   Covid-19 Vaccination Clinic  Name:  Kyle Velazquez    MRN: [id number] DOB: 12-18-1955  10/02/2019  Kyle Velazquez was observed post Covid-19 immunization for 15 minutes without incidence. He was provided with Vaccine Information Sheet and instruction to access the V-Safe system.   Kyle Velazquez was instructed to call 911 with any severe reactions post vaccine: Marland Kitchen Difficulty breathing  . Swelling of your face and throat  . A fast heartbeat  . A bad rash all over your body  . Dizziness and weakness    Immunizations Administered    Name Date Dose VIS Date Route   Pfizer COVID-19 Vaccine 10/02/2019 12:10 PM 0.3 mL 08/24/2019 Intramuscular   Manufacturer: [employer], [employer]   Lot: [id number]   NDC: [address]-0

## 2019-10-17 ENCOUNTER — Encounter: Payer: Self-pay | Admitting: Psychiatry

## 2019-10-17 ENCOUNTER — Other Ambulatory Visit: Payer: Self-pay

## 2019-10-17 ENCOUNTER — Ambulatory Visit (INDEPENDENT_AMBULATORY_CARE_PROVIDER_SITE_OTHER): Payer: BC Managed Care – PPO | Admitting: Psychiatry

## 2019-10-17 DIAGNOSIS — F411 Generalized anxiety disorder: Secondary | ICD-10-CM

## 2019-10-17 MED ORDER — ALPRAZOLAM 1 MG PO TABS: ORAL_TABLET | ORAL | 5 refills | Status: DC

## 2019-10-17 MED ORDER — SERTRALINE HCL 50 MG PO TABS: 25.0000 mg | ORAL_TABLET | Freq: Every day | ORAL | 3 refills | Status: DC

## 2019-10-17 NOTE — Progress Notes (Signed)
Kyle Velazquez [id number] Jan 27, 1956 64 y.o.  Subjective:   Patient ID:  Kyle Velazquez is a 64 y.o. (DOB [birth year]) male.  Chief Complaint:  Chief Complaint  Patient presents with  . Follow-up     Medication Management  . Anxiety     Medication Management    HPI Kyle Velazquez presents to the office today for follow-up of GAD.  Last seen August 2020.  No meds were changed.  Got his first vaccine and 2nd pending.   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  Still helping out 9-3 pm for a year.  But does little and it's not my problem.  A lot less stress. Worked out well.  PPE helped them out.  No black cloud on Sunday afternoons anymore.   Kyle Velazquez is also helping out too.    Kyle Velazquez  just turned 64 yo and they moved to [place].  Got to see kids at holidays.  Son doing really well with big promotion.  D in [place].    No other complaints.  Doesn't have control over the time line.  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  Satisfied with meds.    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    Anxiety manageable.  Chronic issues.  Does well with the meds.  Aware of risks the Bz.   Patient reports stable mood and denies depressed or irritable moods.  Patient denies any recent difficulty with anxiety.  Patient denies difficulty with sleep initiation or maintenance. Denies appetite disturbance.  Patient reports that energy and motivation have been good.  Patient denies any difficulty with concentration.  Patient denies any suicidal ideation.  Past Psychiatric Medication Trials: Sertraline, Lexapro, buspirone, Xanax with a long history of 1 mg 4 times daily.  He has been able to reduce it to half a milligram 5 times daily Under my psychiatric care since 2002  Review of Systems:  Review of Systems  Neurological: Negative for tremors and weakness.  Psychiatric/Behavioral: Negative for agitation, behavioral problems, confusion, decreased  concentration, dysphoric mood, hallucinations, self-injury, sleep disturbance and suicidal ideas. The patient is nervous/anxious. The patient is not hyperactive.     Medications: I have reviewed the patient's current medications.  Current Outpatient Medications  Medication Sig Dispense Refill  . ALPRAZolam (XANAX) 1 MG tablet TAKE 1/2 TABLET BY MOUTH 5 TIMES A DAY 75 tablet 5  . amLODipine (NORVASC) 10 MG tablet Take 10 mg by mouth daily.    . Continuous Blood Gluc Sensor (FREESTYLE LIBRE 14 DAY SENSOR) MISC by Does not apply route.    Marland Kitchen glucose blood test strip 1 each by Other route as needed for other. Use as instructed    . hydrochlorothiazide (HYDRODIURIL) 25 MG tablet Take 1 tablet (25 mg total) by mouth daily. 90 tablet 3  . insulin aspart (NOVOLOG) 100 UNIT/ML injection Inject into the skin 3 (three) times daily before meals. INJECT UP TO 70 UNITS DAILY ON A SLIDING SCALE    . PERCOCET 10-325 MG tablet Take 1 tablet by mouth every 6 (six) hours as needed. AS NEEDED FOR PAIN  0  . potassium chloride SA (K-DUR) 20 MEQ tablet Take 1 tablet (20 mEq total) by mouth daily. 90 tablet 3  . ramipril (ALTACE) 5 MG capsule Take 5 mg by mouth daily.    . sertraline (ZOLOFT) 50 MG tablet Take 0.5 tablets (25 mg total) by mouth daily. 90 tablet 3  .  valACYclovir (VALTREX) 500 MG tablet Take 500 mg by mouth daily.     No current facility-administered medications for this visit.    Medication Side Effects: None; no sedation.  Allergies: No Known Allergies  Past Medical History:  Diagnosis Date  . Diabetic neuropathy (HCC)   . Hyperlipidemia   . Hypertension     Family History  Problem Relation Age of Onset  . CAD Mother   . CAD Father     Social History   Socioeconomic History  . Marital status: Married    Spouse name: Not on file  . Number of children: Not on file  . Years of education: Not on file  . Highest education level: Not on file  Occupational History  . Not on file   Tobacco Use  . Smoking status: Former Games developer  . Smokeless tobacco: Never Used  Substance and Sexual Activity  . Alcohol use: No  . Drug use: No  . Sexual activity: Yes  Other Topics Concern  . Not on file  Social History Narrative  . Not on file   Social Determinants of Health   Financial Resource Strain:   . Difficulty of Paying Living Expenses: Not on file  Food Insecurity:   . Worried About Programme researcher, broadcasting/film/video in the Last Year: Not on file  . Ran Out of Food in the Last Year: Not on file  Transportation Needs:   . Lack of Transportation (Medical): Not on file  . Lack of Transportation (Non-Medical): Not on file  Physical Activity:   . Days of Exercise per Week: Not on file  . Minutes of Exercise per Session: Not on file  Stress:   . Feeling of Stress : Not on file  Social Connections:   . Frequency of Communication with Friends and Family: Not on file  . Frequency of Social Gatherings with Friends and Family: Not on file  . Attends Religious Services: Not on file  . Active Member of Clubs or Organizations: Not on file  . Attends Banker Meetings: Not on file  . Marital Status: Not on file  Intimate Partner Violence:   . Fear of Current or Ex-Partner: Not on file  . Emotionally Abused: Not on file  . Physically Abused: Not on file  . Sexually Abused: Not on file    Past Medical History, Surgical history, Social history, and Family history were reviewed and updated as appropriate.   Please see review of systems for further details on the patient's review from today.   Objective:   Physical Exam:  There were no vitals taken for this visit.  Physical Exam Constitutional:      General: He is not in acute distress.    Appearance: He is well-developed.  Musculoskeletal:        General: No deformity.  Neurological:     Mental Status: He is alert and oriented to person, place, and time.     Motor: No tremor.     Coordination: Coordination normal.      Gait: Gait normal.  Psychiatric:        Attention and Perception: Attention and perception normal.        Mood and Affect: Mood is anxious. Mood is not depressed. Affect is not labile, blunt, angry or inappropriate.        Speech: Speech normal.        Behavior: Behavior normal.        Thought Content: Thought content normal. Thought content  does not include homicidal or suicidal ideation. Thought content does not include homicidal or suicidal plan.        Cognition and Memory: Cognition normal.        Judgment: Judgment normal.     Comments: Insight intact. No auditory or visual hallucinations. No delusions.      Lab Review:     Component Value Date/Time   NA 134 06/11/2019 0931   K 4.7 06/11/2019 0931   CL 95 (L) 06/11/2019 0931   CO2 25 06/11/2019 0931   GLUCOSE 223 (H) 06/11/2019 0931   BUN 13 06/11/2019 0931   CREATININE 0.76 06/11/2019 0931   CALCIUM 10.0 06/11/2019 0931   GFRNONAA 96 06/11/2019 0931   GFRAA 111 06/11/2019 0931    No results found for: WBC, RBC, HGB, HCT, PLT, MCV, MCH, MCHC, RDW, LYMPHSABS, MONOABS, EOSABS, BASOSABS  No results found for: POCLITH, LITHIUM   No results found for: PHENYTOIN, PHENOBARB, VALPROATE, CBMZ   .res Assessment: Plan:    Lola was seen today for follow-up and anxiety.  Diagnoses and all orders for this visit:  Generalized anxiety disorder -     sertraline (ZOLOFT) 50 MG tablet; Take 0.5 tablets (25 mg total) by mouth daily. -     ALPRAZolam (XANAX) 1 MG tablet; TAKE 1/2 TABLET BY MOUTH 5 TIMES A DAY    Disc stress Covid.    We discussed the short-term risks associated with benzodiazepines including sedation and increased fall risk among others.  Discussed long-term side effect risk including dependence, potential withdrawal symptoms, and the potential eventual dose-related risk of dementia.  Benefit from meds.  No complaints.  Tolerating.  Discussed the pros and cons of potentially weaning the sertraline at his next  visit.  He does not feel that he can wean the alprazolam with or without sertraline presents.  He did well for years with just alprazolam until business stress got out of hand.  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  He still working 30 hours a week but around the time of his next visit he will be discontinuing entirely and retiring.  We discussed transition to retirement and importance of staying active.  He indicates he will do so.  Therefore no med changes today  FU 6 mos  Meredith Staggers, MD, DFAPA   Please see After Visit Summary for patient specific instructions.  Future Appointments  Date Time Provider Department Center  10/22/2019 11:45 AM [facility]  04/15/2020  2:00 PM Cottle, Steva Ready., MD CP-CP None    No orders of the defined types were placed in this encounter.     -------------------------------

## 2019-10-22 ENCOUNTER — Ambulatory Visit: Payer: BC Managed Care – PPO | Attending: Internal Medicine

## 2019-10-22 DIAGNOSIS — Z23 Encounter for immunization: Secondary | ICD-10-CM | POA: Insufficient documentation

## 2019-10-22 NOTE — Progress Notes (Signed)
   Covid-19 Vaccination Clinic  Name:  Kyle Velazquez    MRN: [id number] DOB: [birth year]  10/22/2019  Kyle Velazquez was observed post Covid-19 immunization for 15 minutes without incidence. He was provided with Vaccine Information Sheet and instruction to access the V-Safe system.   Kyle Velazquez was instructed to call 911 with any severe reactions post vaccine: Marland Kitchen Difficulty breathing  . Swelling of your face and throat  . A fast heartbeat  . A bad rash all over your body  . Dizziness and weakness    Immunizations Administered    Name Date Dose VIS Date Route   Pfizer COVID-19 Vaccine 10/22/2019 11:04 AM 0.3 mL 08/24/2019 Intramuscular   Manufacturer: [employer], [employer]   Lot: [id number]   NDC: [address]-4

## 2019-10-30 DIAGNOSIS — A6001 Herpesviral infection of penis: Secondary | ICD-10-CM | POA: Diagnosis not present

## 2019-10-30 DIAGNOSIS — N342 Other urethritis: Secondary | ICD-10-CM | POA: Diagnosis not present

## 2019-12-06 DIAGNOSIS — I10 Essential (primary) hypertension: Secondary | ICD-10-CM | POA: Diagnosis not present

## 2019-12-06 DIAGNOSIS — J312 Chronic pharyngitis: Secondary | ICD-10-CM | POA: Diagnosis not present

## 2019-12-06 DIAGNOSIS — E871 Hypo-osmolality and hyponatremia: Secondary | ICD-10-CM | POA: Diagnosis not present

## 2019-12-06 DIAGNOSIS — E1040 Type 1 diabetes mellitus with diabetic neuropathy, unspecified: Secondary | ICD-10-CM | POA: Diagnosis not present

## 2020-01-15 ENCOUNTER — Ambulatory Visit: Payer: BC Managed Care – PPO | Admitting: Cardiovascular Disease

## 2020-01-15 ENCOUNTER — Other Ambulatory Visit: Payer: Self-pay

## 2020-01-15 ENCOUNTER — Encounter: Payer: Self-pay | Admitting: Cardiovascular Disease

## 2020-01-15 VITALS — BP 130/60 | HR 45 | Ht 66.0 in | Wt 148.1 lb

## 2020-01-15 DIAGNOSIS — I10 Essential (primary) hypertension: Secondary | ICD-10-CM | POA: Diagnosis not present

## 2020-01-15 DIAGNOSIS — E785 Hyperlipidemia, unspecified: Secondary | ICD-10-CM | POA: Diagnosis not present

## 2020-01-15 LAB — BASIC METABOLIC PANEL
BUN/Creatinine Ratio: 13 (ref 10–24)
BUN: 9 mg/dL (ref 8–27)
CO2: 27 mmol/L (ref 20–29)
Calcium: 10 mg/dL (ref 8.6–10.2)
Chloride: 97 mmol/L (ref 96–106)
Creatinine, Ser: 0.72 mg/dL — ABNORMAL LOW (ref 0.76–1.27)
GFR calc Af Amer: 113 mL/min/{1.73_m2} (ref >59)
GFR calc non Af Amer: 98 mL/min/{1.73_m2} (ref >59)
Glucose: 185 mg/dL — ABNORMAL HIGH (ref 65–99)
Potassium: 4.3 mmol/L (ref 3.5–5.2)
Sodium: 135 mmol/L (ref 134–144)

## 2020-01-15 NOTE — Progress Notes (Signed)
Cardiology Office Note:    Date:  01/15/2020   ID:  Kyle Velazquez, DOB 1956/09/01, MRN [id number]  PCP:  Adrian Prince, MD  Cardiologist:  Kenli Waldo  Electrophysiologist:  None   Referring MD: Adrian Prince, MD   Chief Complaint  Patient presents with  . Hyperlipidemia  . Hypertension    History of Present Illness:    Kyle Velazquez is a 64 y.o. male with a hx of hypertension .  I saw him via telemedicine last year.  He has been exercising regularly . Exercised regularly   Did have 1 episode of DOE while walking to his car - a month ago  Has not occurred since. No CP.   Past Medical History:  Diagnosis Date  . Diabetic neuropathy (HCC)   . Hyperlipidemia   . Hypertension     Past Surgical History:  Procedure Laterality Date  . SHOULDER SURGERY  2009    Current Medications: Current Meds  Medication Sig  . ALPRAZolam (XANAX) 1 MG tablet TAKE 1/2 TABLET BY MOUTH 5 TIMES A DAY  . amLODipine (NORVASC) 10 MG tablet Take 10 mg by mouth daily.  . betamethasone dipropionate 0.05 % cream Apply 1 drop topically 2 (two) times daily.  . Continuous Blood Gluc Sensor (FREESTYLE LIBRE 14 DAY SENSOR) MISC by Does not apply route.  Marland Kitchen glucose blood test strip 1 each by Other route as needed for other. Use as instructed  . hydrochlorothiazide (HYDRODIURIL) 25 MG tablet Take 1 tablet (25 mg total) by mouth daily.  . insulin aspart (NOVOLOG) 100 UNIT/ML injection Inject into the skin 3 (three) times daily before meals. INJECT UP TO 70 UNITS DAILY ON A SLIDING SCALE  . omeprazole (PRILOSEC) 40 MG capsule Take 40 mg by mouth daily.  Marland Kitchen PERCOCET 10-325 MG tablet Take 1 tablet by mouth every 6 (six) hours as needed. AS NEEDED FOR PAIN  . potassium chloride SA (K-DUR) 20 MEQ tablet Take 1 tablet (20 mEq total) by mouth daily.  . ramipril (ALTACE) 5 MG capsule Take 5 mg by mouth daily.  . sertraline (ZOLOFT) 50 MG tablet Take 0.5 tablets (25 mg total) by mouth daily.  . Testosterone 20.25  MG/ACT (1.62%) GEL SMARTSIG:3 Pump Topical Daily  . valACYclovir (VALTREX) 500 MG tablet Take 500 mg by mouth daily.     Allergies:   Patient has no known allergies.   Social History   Socioeconomic History  . Marital status: Married    Spouse name: Not on file  . Number of children: Not on file  . Years of education: Not on file  . Highest education level: Not on file  Occupational History  . Not on file  Tobacco Use  . Smoking status: Former Games developer  . Smokeless tobacco: Never Used  Substance and Sexual Activity  . Alcohol use: No  . Drug use: No  . Sexual activity: Yes  Other Topics Concern  . Not on file  Social History Narrative  . Not on file   Social Determinants of Health   Financial Resource Strain:   . Difficulty of Paying Living Expenses:   Food Insecurity:   . Worried About Programme researcher, broadcasting/film/video in the Last Year:   . Barista in the Last Year:   Transportation Needs:   . Freight forwarder (Medical):   Marland Kitchen Lack of Transportation (Non-Medical):   Physical Activity:   . Days of Exercise per Week:   . Minutes of Exercise per Session:  Stress:   . Feeling of Stress :   Social Connections:   . Frequency of Communication with Friends and Family:   . Frequency of Social Gatherings with Friends and Family:   . Attends Religious Services:   . Active Member of Clubs or Organizations:   . Attends Banker Meetings:   Marland Kitchen Marital Status:      Family History: The patient's family history includes CAD in his father and mother.  ROS:   Please see the history of present illness.     All other systems reviewed and are negative.  EKGs/Labs/Other Studies Reviewed:    The following studies were reviewed today:   EKG:  Jan 15, 2020:   Sinus brady at 45.  No ST or T wave  Recent Labs: 06/11/2019: BUN 13; Creatinine, Ser 0.76; Potassium 4.7; Sodium 134  Recent Lipid Panel No results found for: CHOL, TRIG, HDL, CHOLHDL, VLDL, LDLCALC,  LDLDIRECT  Physical Exam:    VS:  BP 130/60   Pulse (!) 45   Ht 5\' 6"  (1.676 m)   Wt 148 lb 1.9 oz (67.2 kg)   BMI 23.91 kg/m     Wt Readings from Last 3 Encounters:  01/15/20 148 lb 1.9 oz (67.2 kg)  05/18/19 143 lb (64.9 kg)  03/19/16 166 lb (75.3 kg)     GEN:  Well nourished, well developed in no acute distress,  Long beard  HEENT: Normal NECK: No JVD; No carotid bruits LYMPHATICS: No lymphadenopathy CARDIAC: RRR,  Soft systolic murmur  RESPIRATORY:  Clear to auscultation without rales, wheezing or rhonchi  ABDOMEN: Soft, non-tender, non-distended MUSCULOSKELETAL:  No edema; No deformity  SKIN: Warm and dry NEUROLOGIC:  Alert and oriented x 3 PSYCHIATRIC:  Normal affect   ASSESSMENT:    1. Essential hypertension   2. Hyperlipidemia, unspecified hyperlipidemia type    PLAN:    In order of problems listed above:  1. HTN:   BP is well controlled.   Will check a BMP to follow up with his electrolytes since hes on HCTZ  2. DOE:     He had 1 episode of DOE. 05/20/16   Exercises regularly ,  Does not have DOE at other times.   At this point  I would just follow .   He's to call me if he has further episodes of DOE or any chest pain    Medication Adjustments/Labs and Tests Ordered: Current medicines are reviewed at length with the patient today.  Concerns regarding medicines are outlined above.  Orders Placed This Encounter  Procedures  . Basic Metabolic Panel (BMET)  . EKG 12-Lead   No orders of the defined types were placed in this encounter.   Patient Instructions  Medication Instructions:  Your physician recommends that you continue on your current medications as directed. Please refer to the Current Medication list given to you today.  *If you need a refill on your cardiac medications before your next appointment, please call your pharmacy*   Lab Work: TODAY - basic metabolic panel If you have labs (blood work) drawn today and your tests are completely normal,  you will receive your results only by: Marland Kitchen MyChart Message (if you have MyChart) OR . A paper copy in the mail If you have any lab test that is abnormal or we need to change your treatment, we will call you to review the results.   Testing/Procedures: None Ordered   Follow-Up: At [facility], you and your health needs  are our priority.  As part of our continuing mission to provide you with exceptional heart care, we have created designated Provider Care Teams.  These Care Teams include your primary Cardiologist (physician) and Advanced Practice Providers (APPs -  Physician Assistants and Nurse Practitioners) who all work together to provide you with the care you need, when you need it.   Your next appointment:   1 year(s)  The format for your next appointment:   In Person  Provider:   You may see Mertie Moores, MD or one of the following Advanced Practice Providers on your designated Care Team:    Richardson Dopp, PA-C  Robbie Lis, [place]        Signed, Mertie Moores, MD  01/15/2020 9:30 AM    [place]

## 2020-01-15 NOTE — Patient Instructions (Signed)
Medication Instructions:  Your physician recommends that you continue on your current medications as directed. Please refer to the Current Medication list given to you today.  *If you need a refill on your cardiac medications before your next appointment, please call your pharmacy*   Lab Work: TODAY - basic metabolic panel If you have labs (blood work) drawn today and your tests are completely normal, you will receive your results only by: . MyChart Message (if you have MyChart) OR . A paper copy in the mail If you have any lab test that is abnormal or we need to change your treatment, we will call you to review the results.   Testing/Procedures: None Ordered   Follow-Up: At [facility], you and your health needs are our priority.  As part of our continuing mission to provide you with exceptional heart care, we have created designated Provider Care Teams.  These Care Teams include your primary Cardiologist (physician) and Advanced Practice Providers (APPs -  Physician Assistants and Nurse Practitioners) who all work together to provide you with the care you need, when you need it.     Your next appointment:   1 year(s)  The format for your next appointment:   In Person  Provider:   You may see Philip Nahser, MD or one of the following Advanced Practice Providers on your designated Care Team:    Scott Weaver, PA-C  Vin Bhagat, PA-C      

## 2020-04-07 DIAGNOSIS — E785 Hyperlipidemia, unspecified: Secondary | ICD-10-CM | POA: Diagnosis not present

## 2020-04-07 DIAGNOSIS — E291 Testicular hypofunction: Secondary | ICD-10-CM | POA: Diagnosis not present

## 2020-04-07 DIAGNOSIS — E1040 Type 1 diabetes mellitus with diabetic neuropathy, unspecified: Secondary | ICD-10-CM | POA: Diagnosis not present

## 2020-04-07 DIAGNOSIS — E559 Vitamin D deficiency, unspecified: Secondary | ICD-10-CM | POA: Diagnosis not present

## 2020-04-07 DIAGNOSIS — Z125 Encounter for screening for malignant neoplasm of prostate: Secondary | ICD-10-CM | POA: Diagnosis not present

## 2020-04-07 DIAGNOSIS — E871 Hypo-osmolality and hyponatremia: Secondary | ICD-10-CM | POA: Diagnosis not present

## 2020-04-07 DIAGNOSIS — K900 Celiac disease: Secondary | ICD-10-CM | POA: Diagnosis not present

## 2020-04-15 ENCOUNTER — Telehealth (INDEPENDENT_AMBULATORY_CARE_PROVIDER_SITE_OTHER): Payer: BC Managed Care – PPO | Admitting: Psychiatry

## 2020-04-15 ENCOUNTER — Encounter: Payer: Self-pay | Admitting: Psychiatry

## 2020-04-15 DIAGNOSIS — F411 Generalized anxiety disorder: Secondary | ICD-10-CM | POA: Diagnosis not present

## 2020-04-15 MED ORDER — ALPRAZOLAM 1 MG PO TABS: ORAL_TABLET | ORAL | 5 refills | Status: DC

## 2020-04-15 MED ORDER — SERTRALINE HCL 25 MG PO TABS
25.0000 mg | ORAL_TABLET | Freq: Every day | ORAL | 1 refills | Status: DC
Start: 2020-04-15 — End: 2020-10-13

## 2020-04-15 NOTE — Progress Notes (Signed)
Kyle Velazquez [id number] October 20, 1955 64 y.o.  Video Visit via My Chart  I connected with pt by My Chart and verified that I am speaking with the correct person using two identifiers.   I discussed the limitations, risks, security and privacy concerns of performing an evaluation and management service by My Chart  and the availability of in person appointments. I also discussed with the patient that there may be a patient responsible charge related to this service. The patient expressed understanding and agreed to proceed.  I discussed the assessment and treatment plan with the patient. The patient was provided an opportunity to ask questions and all were answered. The patient agreed with the plan and demonstrated an understanding of the instructions.   The patient was advised to call back or seek an in-person evaluation if the symptoms worsen or if the condition fails to improve as anticipated.  I provided 30 minutes of video time during this encounter.  The patient was located at home and the provider was located office.   Subjective:   Patient ID:  Kyle Velazquez is a 64 y.o. (DOB 1956-08-21) male.  Chief Complaint:  Chief Complaint  Patient presents with  . Follow-up  . Anxiety    HPI Kyle Velazquez presents to the office today for follow-up of GAD.  Last seen 10/2019.  No meds were changed.  04/15/2020 appt with the following noted: Good summer.   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   A lot less stress. Worked out well.  No black cloud on Sunday afternoons anymore.   Alvino Chapel is good.  GD in [place].  D [name] and her BF in [place] moved back to [place].  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    GD Luther Parody  just turned 64 yo and they moved to [place].  Got to see kids at holidays.  Son doing really well with big promotion.   No other complaints.  Doesn't have control over the time line.  The guy works for him, creating stress  there.  Satisfied with meds.   Down to 2 and 1/2 tablets of Xanax daily from 4 and 1/2 tablets daily.  Anxiety manageable.  Chronic issues.  Does well with the meds.  Aware of risks the Bz.   Patient reports stable mood and denies depressed or irritable moods.  Patient denies any recent difficulty with anxiety.  Patient denies difficulty with sleep initiation or maintenance. Denies appetite disturbance.  Patient reports that energy and motivation have been good.  Patient denies any difficulty with concentration.  Patient denies any suicidal ideation.  Past Psychiatric Medication Trials: Sertraline, Lexapro, buspirone, Xanax with a long history of 1 mg 4 times daily.  He has been able to reduce it to half a milligram 5 times daily Under my psychiatric care since 2002  Review of Systems:  Review of Systems  Neurological: Negative for tremors and weakness.  Psychiatric/Behavioral: Negative for agitation, behavioral problems, confusion, decreased concentration, dysphoric mood, hallucinations, self-injury, sleep disturbance and suicidal ideas. The patient is not nervous/anxious and is not hyperactive.     Medications: I have reviewed the patient's current medications.  Current Outpatient Medications  Medication Sig Dispense Refill  . ALPRAZolam (XANAX) 1 MG tablet TAKE 1/2 TABLET BY MOUTH 5 TIMES A DAY 75 tablet 5  . amLODipine (NORVASC) 10 MG tablet Take 10 mg by mouth daily.    . betamethasone dipropionate  0.05 % cream Apply 1 drop topically 2 (two) times daily.    . Continuous Blood Gluc Sensor (FREESTYLE LIBRE 14 DAY SENSOR) MISC by Does not apply route.    Marland Kitchen glucose blood test strip 1 each by Other route as needed for other. Use as instructed    . hydrochlorothiazide (HYDRODIURIL) 25 MG tablet Take 1 tablet (25 mg total) by mouth daily. 90 tablet 3  . insulin aspart (NOVOLOG) 100 UNIT/ML injection Inject into the skin 3 (three) times daily before meals. INJECT UP TO 70 UNITS DAILY ON A  SLIDING SCALE    . omeprazole (PRILOSEC) 40 MG capsule Take 40 mg by mouth daily.    Marland Kitchen PERCOCET 10-325 MG tablet Take 1 tablet by mouth every 6 (six) hours as needed. AS NEEDED FOR PAIN  0  . potassium chloride SA (K-DUR) 20 MEQ tablet Take 1 tablet (20 mEq total) by mouth daily. 90 tablet 3  . ramipril (ALTACE) 5 MG capsule Take 5 mg by mouth daily.    . sertraline (ZOLOFT) 25 MG tablet Take 1 tablet (25 mg total) by mouth daily. 90 tablet 1  . Testosterone 20.25 MG/ACT (1.62%) GEL SMARTSIG:3 Pump Topical Daily    . valACYclovir (VALTREX) 500 MG tablet Take 500 mg by mouth daily.     No current facility-administered medications for this visit.    Medication Side Effects: None; no sedation.  Allergies: No Known Allergies  Past Medical History:  Diagnosis Date  . Diabetic neuropathy (HCC)   . Hyperlipidemia   . Hypertension     Family History  Problem Relation Age of Onset  . CAD Mother   . CAD Father     Social History   Socioeconomic History  . Marital status: Married    Spouse name: Not on file  . Number of children: Not on file  . Years of education: Not on file  . Highest education level: Not on file  Occupational History  . Not on file  Tobacco Use  . Smoking status: Former Games developer  . Smokeless tobacco: Never Used  Substance and Sexual Activity  . Alcohol use: No  . Drug use: No  . Sexual activity: Yes  Other Topics Concern  . Not on file  Social History Narrative  . Not on file   Social Determinants of Health   Financial Resource Strain:   . Difficulty of Paying Living Expenses:   Food Insecurity:   . Worried About Programme researcher, broadcasting/film/video in the Last Year:   . Barista in the Last Year:   Transportation Needs:   . Freight forwarder (Medical):   Marland Kitchen Lack of Transportation (Non-Medical):   Physical Activity:   . Days of Exercise per Week:   . Minutes of Exercise per Session:   Stress:   . Feeling of Stress :   Social Connections:   .  Frequency of Communication with Friends and Family:   . Frequency of Social Gatherings with Friends and Family:   . Attends Religious Services:   . Active Member of Clubs or Organizations:   . Attends Banker Meetings:   Marland Kitchen Marital Status:   Intimate Partner Violence:   . Fear of Current or Ex-Partner:   . Emotionally Abused:   Marland Kitchen Physically Abused:   . Sexually Abused:     Past Medical History, Surgical history, Social history, and Family history were reviewed and updated as appropriate.   Please see review of systems for  further details on the patient's review from today.   Objective:   Physical Exam:  There were no vitals taken for this visit.  Physical Exam Neurological:     Mental Status: He is alert and oriented to person, place, and time.     Cranial Nerves: No dysarthria.  Psychiatric:        Attention and Perception: Attention and perception normal.        Mood and Affect: Mood normal.        Speech: Speech normal.        Behavior: Behavior is cooperative.        Thought Content: Thought content normal. Thought content is not paranoid or delusional. Thought content does not include homicidal or suicidal ideation. Thought content does not include homicidal or suicidal plan.        Cognition and Memory: Cognition and memory normal.        Judgment: Judgment normal.     Comments: Insight intact Further reduction in anxiety with the pending end of job stress.     Lab Review:     Component Value Date/Time   NA 135 01/15/2020 0939   K 4.3 01/15/2020 0939   CL 97 01/15/2020 0939   CO2 27 01/15/2020 0939   GLUCOSE 185 (H) 01/15/2020 0939   BUN 9 01/15/2020 0939   CREATININE 0.72 (L) 01/15/2020 0939   CALCIUM 10.0 01/15/2020 0939   GFRNONAA 98 01/15/2020 0939   GFRAA 113 01/15/2020 0939    No results found for: WBC, RBC, HGB, HCT, PLT, MCV, MCH, MCHC, RDW, LYMPHSABS, MONOABS, EOSABS, BASOSABS  No results found for: POCLITH, LITHIUM   No results  found for: PHENYTOIN, PHENOBARB, VALPROATE, CBMZ   .res Assessment: Plan:    Kyle Velazquez was seen today for follow-up and anxiety.  Diagnoses and all orders for this visit:  Generalized anxiety disorder -     ALPRAZolam (XANAX) 1 MG tablet; TAKE 1/2 TABLET BY MOUTH 5 TIMES A DAY -     sertraline (ZOLOFT) 25 MG tablet; Take 1 tablet (25 mg total) by mouth daily.   We discussed the short-term risks associated with benzodiazepines including sedation and increased fall risk among others.  Discussed long-term side effect risk including dependence, potential withdrawal symptoms, and the potential eventual dose-related risk of dementia.  But recent studies from 2020 dispute this association between benzodiazepines and dementia risk. Newer studies in 2020 do not support an association with dementia.  Benefit from meds.  No complaints.  Tolerating.  Benefit with sertraline and satisfied.  Discussed the pros and cons of potentially weaning the sertraline at his next visit.  He does not feel that he can wean the alprazolam with or without sertraline presents.  He did well for years with just alprazolam until business stress got out of hand.  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   Therefore no med changes today but consider stoppting sertraline at FU  FU 6 mos  Meredith Staggers, MD, DFAPA   Please see After Visit Summary for patient specific instructions.  No future appointments.  No orders of the defined types were placed in this encounter.     -------------------------------

## 2020-04-25 ENCOUNTER — Other Ambulatory Visit: Payer: Self-pay | Admitting: Cardiovascular Disease

## 2020-04-25 ENCOUNTER — Other Ambulatory Visit: Payer: Self-pay | Admitting: Otolaryngology

## 2020-04-25 DIAGNOSIS — J312 Chronic pharyngitis: Secondary | ICD-10-CM | POA: Diagnosis not present

## 2020-04-25 DIAGNOSIS — K219 Gastro-esophageal reflux disease without esophagitis: Secondary | ICD-10-CM | POA: Diagnosis not present

## 2020-05-01 DIAGNOSIS — E103293 Type 1 diabetes mellitus with mild nonproliferative diabetic retinopathy without macular edema, bilateral: Secondary | ICD-10-CM | POA: Diagnosis not present

## 2020-05-01 DIAGNOSIS — H35372 Puckering of macula, left eye: Secondary | ICD-10-CM | POA: Diagnosis not present

## 2020-05-01 DIAGNOSIS — H40013 Open angle with borderline findings, low risk, bilateral: Secondary | ICD-10-CM | POA: Diagnosis not present

## 2020-05-01 DIAGNOSIS — H353131 Nonexudative age-related macular degeneration, bilateral, early dry stage: Secondary | ICD-10-CM | POA: Diagnosis not present

## 2020-05-14 ENCOUNTER — Ambulatory Visit
Admission: RE | Admit: 2020-05-14 | Discharge: 2020-05-14 | Disposition: A | Discharge status: Home / Self Care | Payer: BC Managed Care – PPO | Source: Ambulatory Visit | Attending: Otolaryngology | Admitting: Otolaryngology

## 2020-05-14 DIAGNOSIS — J329 Chronic sinusitis, unspecified: Secondary | ICD-10-CM | POA: Diagnosis not present

## 2020-05-14 DIAGNOSIS — M47812 Spondylosis without myelopathy or radiculopathy, cervical region: Secondary | ICD-10-CM | POA: Diagnosis not present

## 2020-05-14 DIAGNOSIS — J3489 Other specified disorders of nose and nasal sinuses: Secondary | ICD-10-CM | POA: Diagnosis not present

## 2020-05-14 DIAGNOSIS — J8410 Pulmonary fibrosis, unspecified: Secondary | ICD-10-CM | POA: Diagnosis not present

## 2020-05-14 DIAGNOSIS — J312 Chronic pharyngitis: Secondary | ICD-10-CM

## 2020-05-14 MED ORDER — IOPAMIDOL (ISOVUE-300) INJECTION 61%
75.0000 mL | Freq: Once | INTRAVENOUS | Status: AC | PRN
  Administered 2020-05-14: 75 mL via INTRAVENOUS

## 2020-05-23 ENCOUNTER — Other Ambulatory Visit: Payer: Self-pay | Admitting: Cardiovascular Disease

## 2020-07-08 DIAGNOSIS — Z800 Family history of malignant neoplasm of digestive organs: Secondary | ICD-10-CM | POA: Diagnosis not present

## 2020-07-08 DIAGNOSIS — K6389 Other specified diseases of intestine: Secondary | ICD-10-CM | POA: Diagnosis not present

## 2020-07-08 DIAGNOSIS — Z86010 Personal history of colonic polyps: Secondary | ICD-10-CM | POA: Diagnosis not present

## 2020-07-08 DIAGNOSIS — K635 Polyp of colon: Secondary | ICD-10-CM | POA: Diagnosis not present

## 2020-07-08 DIAGNOSIS — K648 Other hemorrhoids: Secondary | ICD-10-CM | POA: Diagnosis not present

## 2020-07-08 DIAGNOSIS — Z1211 Encounter for screening for malignant neoplasm of colon: Secondary | ICD-10-CM | POA: Diagnosis not present

## 2020-08-11 DIAGNOSIS — D1801 Hemangioma of skin and subcutaneous tissue: Secondary | ICD-10-CM | POA: Diagnosis not present

## 2020-08-11 DIAGNOSIS — L218 Other seborrheic dermatitis: Secondary | ICD-10-CM | POA: Diagnosis not present

## 2020-08-11 DIAGNOSIS — D225 Melanocytic nevi of trunk: Secondary | ICD-10-CM | POA: Diagnosis not present

## 2020-08-11 DIAGNOSIS — L821 Other seborrheic keratosis: Secondary | ICD-10-CM | POA: Diagnosis not present

## 2020-08-11 DIAGNOSIS — L570 Actinic keratosis: Secondary | ICD-10-CM | POA: Diagnosis not present

## 2020-08-28 ENCOUNTER — Telehealth: Payer: Self-pay | Admitting: Gastroenterology

## 2020-09-09 DIAGNOSIS — N342 Other urethritis: Secondary | ICD-10-CM | POA: Diagnosis not present

## 2020-10-13 ENCOUNTER — Other Ambulatory Visit: Payer: Self-pay | Admitting: Psychiatry

## 2020-10-13 DIAGNOSIS — F411 Generalized anxiety disorder: Secondary | ICD-10-CM

## 2020-10-29 DIAGNOSIS — E1040 Type 1 diabetes mellitus with diabetic neuropathy, unspecified: Secondary | ICD-10-CM | POA: Diagnosis not present

## 2020-10-30 ENCOUNTER — Other Ambulatory Visit: Payer: Self-pay

## 2020-10-30 ENCOUNTER — Ambulatory Visit: Payer: BC Managed Care – PPO | Admitting: Gastroenterology

## 2020-10-30 VITALS — BP 150/62 | HR 72 | Ht 66.0 in | Wt 147.0 lb

## 2020-10-30 DIAGNOSIS — K900 Celiac disease: Secondary | ICD-10-CM

## 2020-10-30 DIAGNOSIS — K529 Noninfective gastroenteritis and colitis, unspecified: Secondary | ICD-10-CM | POA: Diagnosis not present

## 2020-10-30 DIAGNOSIS — R140 Abdominal distension (gaseous): Secondary | ICD-10-CM | POA: Diagnosis not present

## 2020-10-30 DIAGNOSIS — R143 Flatulence: Secondary | ICD-10-CM

## 2020-10-30 DIAGNOSIS — R1084 Generalized abdominal pain: Secondary | ICD-10-CM | POA: Diagnosis not present

## 2020-10-30 MED ORDER — HYOSCYAMINE SULFATE 0.125 MG SL SUBL
0.1250 mg | SUBLINGUAL_TABLET | SUBLINGUAL | 2 refills | Status: DC | PRN
Start: 2020-10-30 — End: 2022-02-09

## 2020-10-30 NOTE — Patient Instructions (Signed)
If you are age 67 or older, your body mass index should be between 23-30. Your Body mass index is 23.73 kg/m. If this is out of the aforementioned range listed, please consider follow up with your Primary Care Provider.  If you are age 52 or younger, your body mass index should be between 19-25. Your Body mass index is 23.73 kg/m. If this is out of the aformentioned range listed, please consider follow up with your Primary Care Provider.   It was a pleasure to see you today!  Dr. Myrtie Neither  Food Guidelines for those with chronic digestive trouble:  Many people have difficulty digesting certain foods, causing a variety of distressing and embarrassing symptoms such as abdominal pain, bloating and gas.  These foods may need to be avoided or consumed in small amounts.  Here are some tips that might be helpful for you.  1.   Lactose intolerance is the difficulty or complete inability to digest lactose, the natural sugar in milk and anything made from milk.  This condition is harmless, common, and can begin any time during life.  Some people can digest a modest amount of lactose while others cannot tolerate any.  Also, not all dairy products contain equal amounts of lactose.  For example, hard cheeses such as parmesan have less lactose than soft cheeses such as cheddar.  Yogurt has less lactose than milk or cheese.  Many packaged foods (even many brands of bread) have milk, so read ingredient lists carefully.  It is difficult to test for lactose intolerance, so just try avoiding lactose as much as possible for a week and see what happens with your symptoms.  If you seem to be lactose intolerant, the best plan is to avoid it (but make sure you get calcium from another source).  The next best thing is to use lactase enzyme supplements, available over the counter everywhere.  Just know that many lactose intolerant people need to take several tablets with each serving of dairy to avoid symptoms.  Lastly, a lot of  restaurant food is made with milk or butter.  Many are things you might not suspect, such as mashed potatoes, rice and pasta (cooked with butter) and "grilled" items.  If you are lactose intolerant, it never hurts to ask your server what has milk or butter.  2.   Fiber is an important part of your diet, but not all fiber is well-tolerated.  Insoluble fiber such as bran is often consumed by normal gut bacteria and converted into gas.  Soluble fiber such as oats, squash, carrots and green beans are typically tolerated better.  3.   Some types of carbohydrates can be poorly digested.  Examples include: fructose (apples, cherries, pears, raisins and other dried fruits), fructans (onions, zucchini, large amounts of wheat), sorbitol/mannitol/xylitol and sucralose/Splenda (common artificial sweeteners), and raffinose (lentils, broccoli, cabbage, asparagus, brussel sprouts, many types of beans).  Do a Programmer, multimedia for [employer] and you will find helpful information. Beano, a dietary supplement, will often help with raffinose-containing foods.  As with lactase tablets, you may need several per serving.  4.   Whenever possible, avoid processed food&meats and chemical additives.  High fructose corn syrup, a common sweetener, may be difficult to digest.  Eggs and soy (comes from the soybean, and added to many foods now) are other common bloating/gassy foods.  5.  Regarding gluten:  gluten is a protein mainly found in wheat, but also rye and barley.  There is a condition  called celiac sprue, which is an inflammatory reaction in the small intestine causing a variety of digestive symptoms.  Blood testing is highly reliable to look for this condition, and sometimes upper endoscopy with small bowel biopsies may be necessary to make the diagnosis.  Many patients who test negative for celiac sprue report improvement in their digestive symptoms when they switch to a gluten-free diet.  However, in these "non-celiac gluten  sensitive" patients, the true role of gluten in their symptoms is unclear.  Reducing carbohydrates in general may decrease the gas and bloating caused when gut bacteria consume carbs. Also, some of these patients may actually be intolerant of the baker's yeast in bread products rather than the gluten.  Flatbread and other reduced yeast breads might therefore be tolerated.  There is no specific testing available for most food intolerances, which are discovered mainly by dietary elimination.  Please do not embark on a gluten free diet unless directed by your doctor, as it is highly restrictive, and may lead to nutritional deficiencies if not carefully monitored.  Lastly, beware of internet claims offering "personalized" tests for food intolerances.  Such testing has no reliable scientific evidence to support its reliability and correlation to symptoms.    6.  The best advice is old advice, especially for those with chronic digestive trouble - try to eat "clean".  Balanced diet, avoid processed food, plenty of fruits and vegetables, cut down the sugar, minimal alcohol, avoid tobacco. Make time to care for yourself, get enough sleep, exercise when you can, reduce stress.  Your guts will thank you for it.   - Dr. Sherly[facility]

## 2020-10-30 NOTE — Progress Notes (Signed)
[facility] Consult Note:  History: Kyle Velazquez 10/30/2020  Referring provider: Adrian Prince, MD  Reason for consult/chief complaint: Celiac Disease (Recurrent episodes of sharp generalized abd pain after meals/drinks. Pt also has intermittent loose stools. Pt states he tries to stay on a gluten free diet.)   Subjective  HPI:  This is a very pleasant 65 year old man who came to see me for ongoing management of his digestive issues since his previous GI physician Dr. Kinnie Scales has recently retired from Surveyor, mining.  Kyle Velazquez had abdominal pain and chronic diarrhea several years ago and primary care did some lab work indicating possible celiac.  Kyle Velazquez had an upper endoscopy with small bowel biopsy showing marked celiac changes.  He went on a gluten-free diet and says he is strictly adherent to that, though he understands there is sometimes inadvertent ingestion of gluten when the eat out.  Even feels so sensitive to it that if he eats out the same plate that he just had a piece of bread he will feel sick afterwards. He has chronic lower abdominal bloating and discomfort and usually 4-6 semiformed to loose stools per day without blood.  His PCP referred him back to Dr. Kinnie Scales several months ago for a routine colonoscopy, and the report indicates history of colon polyps in family history of colon cancer.  However, Kyle Velazquez says that he does not believe he had previously had a polyp and nor does he have a family history of cancer.  He did not have the opportunity to speak to Dr. Kinnie Scales prior to that procedure and thus was unable to inform him of his chronic diarrhea. He has bloating and gas that are quite bothersome and at times embarrassing.  ROS:  Review of Systems  Constitutional: Negative for appetite change and unexpected weight change.  HENT: Negative for mouth sores and voice change.   Eyes: Negative for pain and redness.  Respiratory: Negative for cough and  shortness of breath.   Cardiovascular: Negative for chest pain and palpitations.  Genitourinary: Negative for dysuria and hematuria.  Musculoskeletal: Negative for arthralgias and myalgias.  Skin: Negative for pallor and rash.  Neurological: Negative for weakness and headaches.  Hematological: Negative for adenopathy.     Past Medical History: Past Medical History:  Diagnosis Date  . Celiac disease   . Chronic back pain   . Chronic shoulder pain   . Diabetes 1.5, managed as type 2 (HCC)   . Diabetic neuropathy (HCC)   . Hyperlipidemia   . Hypertension   . Internal hemorrhoids      Past Surgical History: Past Surgical History:  Procedure Laterality Date  . COLONOSCOPY  2007 2016   Dr Kinnie Scales   . ESOPHAGOGASTRODUODENOSCOPY  2011   Dr Kinnie Scales   . NECK SURGERY    . SHOULDER SURGERY  2009     Family History: Family History  Problem Relation Age of Onset  . CAD Mother   . CAD Father     Social History: Social History   Socioeconomic History  . Marital status: Married    Spouse name: Not on file  . Number of children: Not on file  . Years of education: Not on file  . Highest education level: Not on file  Occupational History  . Not on file  Tobacco Use  . Smoking status: Former Games developer  . Smokeless tobacco: Never Used  Substance and Sexual Activity  . Alcohol use: No  . Drug use: No  . Sexual activity: Yes  Other Topics Concern  . Not on file  Social History Narrative  . Not on file   Social Determinants of Health   Financial Resource Strain: Not on file  Food Insecurity: Not on file  Transportation Needs: Not on file  Physical Activity: Not on file  Stress: Not on file  Social Connections: Not on file    Allergies: No Known Allergies  Outpatient Meds: Current Outpatient Medications  Medication Sig Dispense Refill  . ALPRAZolam (XANAX) 1 MG tablet TAKE 1/2 TABLET BY MOUTH 5 TIMES A DAY 75 tablet 5  . amLODipine (NORVASC) 10 MG tablet Take 10 mg  by mouth daily.    . betamethasone dipropionate 0.05 % cream Apply 1 drop topically 2 (two) times daily.    . Continuous Blood Gluc Sensor (FREESTYLE LIBRE 14 DAY SENSOR) MISC by Does not apply route.    Marland Kitchen glucose blood test strip 1 each by Other route as needed for other. Use as instructed    . hydrochlorothiazide (HYDRODIURIL) 25 MG tablet TAKE 1 TABLET(25 MG) BY MOUTH DAILY 90 tablet 2  . insulin aspart (NOVOLOG) 100 UNIT/ML injection Inject into the skin 3 (three) times daily before meals. INJECT UP TO 70 UNITS DAILY ON A SLIDING SCALE    . omeprazole (PRILOSEC) 40 MG capsule Take 40 mg by mouth daily.    Marland Kitchen PERCOCET 10-325 MG tablet Take 1 tablet by mouth every 6 (six) hours as needed. AS NEEDED FOR PAIN  0  . potassium chloride SA (KLOR-CON) 20 MEQ tablet TAKE 1 TABLET(20 MEQ) BY MOUTH DAILY 90 tablet 3  . ramipril (ALTACE) 5 MG capsule Take 5 mg by mouth daily.    . sertraline (ZOLOFT) 25 MG tablet TAKE 1 TABLET(25 MG) BY MOUTH DAILY 90 tablet 1  . Testosterone 20.25 MG/ACT (1.62%) GEL SMARTSIG:3 Pump Topical Daily    . valACYclovir (VALTREX) 500 MG tablet Take 500 mg by mouth daily.     No current facility-administered medications for this visit.      ___________________________________________________________________ Objective   Exam:  BP (!) 150/62   Pulse 72   Ht 5\' 6"  (1.676 m)   Wt 147 lb (66.7 kg)   BMI 23.73 kg/m  Wt Readings from Last 3 Encounters:  10/30/20 147 lb (66.7 kg)  01/15/20 148 lb 1.9 oz (67.2 kg)  05/18/19 143 lb (64.9 kg)     General: Well-appearing  Eyes: sclera anicteric, no redness  ENT: oral mucosa moist without lesions, no cervical or supraclavicular lymphadenopathy  CV: RRR without murmur, S1/S2, no JVD, no peripheral edema  Resp: clear to auscultation bilaterally, normal RR and effort noted  GI: soft, no tenderness, with active bowel sounds. No guarding or palpable organomegaly noted.  Skin; warm and dry, no rash or jaundice  noted  Neuro: awake, alert and oriented x 3. Normal gross motor function and fluent speech  Labs:  Upper endoscopy 10/11/2017, biopsies of the duodenum showing marked villous atrophy and increased intraepithelial lymphocytes consistent with celiac marsh type IIIb (type IIIa in the bulb)  Abdominal ultrasound February 2019 borderline dilatation pancreatic duct, biliary sludge without gallstones    Colonoscopy report 07/08/2020.  Complete exam, 5 mm proximal transverse colon polyp removed (no pathology report available at this time)    Assessment: Encounter Diagnoses  Name Primary?  . Celiac disease Yes  . Generalized abdominal pain   . Chronic diarrhea   . Abdominal bloating   . Flatulence     Kyle Velazquez is adherent to a  gluten-free diet and has persistent diarrhea, indicating perhaps inadvertent gluten ingestion or additional diagnoses.  Differential includes maldigestion, IBS, SIBO, microscopic colitis (no random colon biopsies taken during most recent colonoscopy).  Plan:  Written dietary advice given regarding bloating and gas  Trial of Levsin  He will keep in touch with me by portal message.  If no improvement with the above, obtain breath testing to look for SIBO.  If unrevealing, repeat colonoscopy for biopsies and also upper endoscopy with duodenal biopsies.  Lastly, he tells me that primary care checks his vitamin D level regularly.  For health maintenance related to celiac, he needs a baseline bone density study, and I have asked him to contact primary care about this.  I will forward my note to them.  Thank you for the courtesy of this consult.  Please call me with any questions or concerns.  Kyle Velazquez  CC: Referring provider noted above

## 2020-10-31 DIAGNOSIS — G4486 Cervicogenic headache: Secondary | ICD-10-CM | POA: Diagnosis not present

## 2020-11-07 ENCOUNTER — Other Ambulatory Visit: Payer: Self-pay | Admitting: Psychiatry

## 2020-11-07 DIAGNOSIS — F411 Generalized anxiety disorder: Secondary | ICD-10-CM

## 2020-11-17 DIAGNOSIS — J312 Chronic pharyngitis: Secondary | ICD-10-CM | POA: Diagnosis not present

## 2020-11-17 DIAGNOSIS — K219 Gastro-esophageal reflux disease without esophagitis: Secondary | ICD-10-CM | POA: Diagnosis not present

## 2020-11-17 DIAGNOSIS — J310 Chronic rhinitis: Secondary | ICD-10-CM | POA: Insufficient documentation

## 2020-11-18 DIAGNOSIS — R030 Elevated blood-pressure reading, without diagnosis of hypertension: Secondary | ICD-10-CM | POA: Diagnosis not present

## 2020-11-18 DIAGNOSIS — M542 Cervicalgia: Secondary | ICD-10-CM | POA: Diagnosis not present

## 2020-11-20 DIAGNOSIS — I10 Essential (primary) hypertension: Secondary | ICD-10-CM | POA: Diagnosis not present

## 2020-11-20 DIAGNOSIS — E1040 Type 1 diabetes mellitus with diabetic neuropathy, unspecified: Secondary | ICD-10-CM | POA: Diagnosis not present

## 2020-12-02 DIAGNOSIS — H9193 Unspecified hearing loss, bilateral: Secondary | ICD-10-CM | POA: Diagnosis not present

## 2020-12-15 ENCOUNTER — Encounter: Payer: Self-pay | Admitting: Psychiatry

## 2020-12-15 ENCOUNTER — Other Ambulatory Visit: Payer: Self-pay

## 2020-12-15 ENCOUNTER — Ambulatory Visit (INDEPENDENT_AMBULATORY_CARE_PROVIDER_SITE_OTHER): Payer: BC Managed Care – PPO | Admitting: Psychiatry

## 2020-12-15 DIAGNOSIS — F411 Generalized anxiety disorder: Secondary | ICD-10-CM

## 2020-12-15 MED ORDER — ALPRAZOLAM 1 MG PO TABS: ORAL_TABLET | ORAL | 5 refills | Status: DC

## 2020-12-15 MED ORDER — SERTRALINE HCL 25 MG PO TABS: 25.0000 mg | ORAL_TABLET | Freq: Every day | ORAL | 3 refills | Status: DC

## 2020-12-15 NOTE — Progress Notes (Signed)
Kyle Velazquez [id number] [birth year] 65 y.o.   Subjective:   Patient ID:  Kyle Velazquez is a 65 y.o. (DOB 06-21-1956) male.  Chief Complaint:  Chief Complaint  Patient presents with  . Follow-up  . Generalized anxiety disorder    HPI Kyle Velazquez presents to the office today for follow-up of GAD.  seen 10/2019.  No meds were changed.  04/15/2020 appt with the following noted: Good summer.   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   A lot less stress. Worked out well.  No black cloud on Sunday afternoons anymore.   Kyle Velazquez is good.  GD in [place]. D [name] and her BF in [place] moved back to [place].  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   GD Luther Parody  just turned 65 yo and they moved to [place].  Got to see kids at holidays.  Son doing really well with big promotion.   No other complaints.  Doesn't have control over the time line.  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  Satisfied with meds.   Down to 2 and 1/2 tablets of Xanax daily from 4 and 1/2 tablets daily. Anxiety manageable.  Chronic issues.  Does well with the meds.  Aware of risks the Bz.   Patient reports stable mood and denies depressed or irritable moods.  Patient denies any recent difficulty with anxiety.  Patient denies difficulty with sleep initiation or maintenance. Denies appetite disturbance.  Patient reports that energy and motivation have been good.  Patient denies any difficulty with concentration.  Patient denies any suicidal ideation.   12/15/2020 appt noted: Doing great in retirement.  So great to have that off him.  Ready to retire this time.  No complaints.  No black clouds. No problems with meds. Patient reports stable mood and denies depressed or irritable moods.  Patient denies any recent difficulty with anxiety.  Patient denies difficulty with sleep initiation or maintenance. Denies appetite disturbance.  Patient reports that energy and motivation  have been good.  Patient denies any difficulty with concentration.  Patient denies any suicidal ideation. Satisfied with sertraline 25 mg.  Celiac dx for 3 years.If eats clean GI is OK.  So not related to sertraline.  Past Psychiatric Medication Trials: Sertraline, Lexapro, buspirone, Xanax with a long history of 1 mg 4 times daily.  He has been able to reduce it to half a milligram 5 times daily Under my psychiatric care since 2002  Review of Systems:  Review of Systems  Neurological: Negative for tremors and weakness.  Psychiatric/Behavioral: Negative for agitation, behavioral problems, confusion, decreased concentration, dysphoric mood, hallucinations, self-injury, sleep disturbance and suicidal ideas. The patient is not nervous/anxious and is not hyperactive.     Medications: I have reviewed the patient's current medications.  Current Outpatient Medications  Medication Sig Dispense Refill  . amLODipine (NORVASC) 10 MG tablet Take 10 mg by mouth daily.    . Continuous Blood Gluc Sensor (FREESTYLE LIBRE 14 DAY SENSOR) MISC by Does not apply route.    Marland Kitchen glucose blood test strip 1 each by Other route as needed for other. Use as instructed    . hydrochlorothiazide (HYDRODIURIL) 25 MG tablet TAKE 1 TABLET(25 MG) BY MOUTH DAILY 90 tablet 2  . hyoscyamine (LEVSIN SL) 0.125 MG SL tablet Place 1 tablet (0.125 mg total) under the tongue every 4 (four) hours as needed. 30 tablet 2  . insulin  aspart (NOVOLOG) 100 UNIT/ML injection Inject into the skin 3 (three) times daily before meals. INJECT UP TO 70 UNITS DAILY ON A SLIDING SCALE    . omeprazole (PRILOSEC) 40 MG capsule Take 40 mg by mouth daily.    Marland Kitchen PERCOCET 10-325 MG tablet Take 1 tablet by mouth every 6 (six) hours as needed. AS NEEDED FOR PAIN  0  . potassium chloride SA (KLOR-CON) 20 MEQ tablet TAKE 1 TABLET(20 MEQ) BY MOUTH DAILY 90 tablet 3  . ramipril (ALTACE) 5 MG capsule Take 5 mg by mouth daily.    . Testosterone 20.25 MG/ACT (1.62%)  GEL SMARTSIG:3 Pump Topical Daily    . valACYclovir (VALTREX) 500 MG tablet Take 500 mg by mouth daily.    Marland Kitchen ALPRAZolam (XANAX) 1 MG tablet TAKE 1/2 TABLET BY MOUTH FIVE TIMES DAILY 75 tablet 5  . betamethasone dipropionate 0.05 % cream Apply 1 drop topically 2 (two) times daily. (Patient not taking: Reported on 12/15/2020)    . sertraline (ZOLOFT) 25 MG tablet Take 1 tablet (25 mg total) by mouth daily. 90 tablet 3   No current facility-administered medications for this visit.    Medication Side Effects: None; no sedation.  Allergies: No Known Allergies  Past Medical History:  Diagnosis Date  . Celiac disease   . Chronic back pain   . Chronic shoulder pain   . Diabetes 1.5, managed as type 2 (HCC)   . Diabetic neuropathy (HCC)   . Hyperlipidemia   . Hypertension   . Internal hemorrhoids     Family History  Problem Relation Age of Onset  . CAD Mother   . CAD Father     Social History   Socioeconomic History  . Marital status: Married    Spouse name: Not on file  . Number of children: Not on file  . Years of education: Not on file  . Highest education level: Not on file  Occupational History  . Not on file  Tobacco Use  . Smoking status: Former Games developer  . Smokeless tobacco: Never Used  Substance and Sexual Activity  . Alcohol use: No  . Drug use: No  . Sexual activity: Yes  Other Topics Concern  . Not on file  Social History Narrative  . Not on file   Social Determinants of Health   Financial Resource Strain: Not on file  Food Insecurity: Not on file  Transportation Needs: Not on file  Physical Activity: Not on file  Stress: Not on file  Social Connections: Not on file  Intimate Partner Violence: Not on file    Past Medical History, Surgical history, Social history, and Family history were reviewed and updated as appropriate.   Please see review of systems for further details on the patient's review from today.   Objective:   Physical Exam:  There  were no vitals taken for this visit.  Physical Exam Neurological:     Mental Status: He is alert and oriented to person, place, and time.     Cranial Nerves: No dysarthria.  Psychiatric:        Attention and Perception: Attention and perception normal.        Mood and Affect: Mood normal.        Speech: Speech normal.        Behavior: Behavior is cooperative.        Thought Content: Thought content normal. Thought content is not paranoid or delusional. Thought content does not include homicidal or suicidal ideation. Thought  content does not include homicidal or suicidal plan.        Cognition and Memory: Cognition and memory normal.        Judgment: Judgment normal.     Comments: Insight intact Further reduction in anxiety with the pending end of job stress.     Lab Review:     Component Value Date/Time   NA 135 01/15/2020 0939   K 4.3 01/15/2020 0939   CL 97 01/15/2020 0939   CO2 27 01/15/2020 0939   GLUCOSE 185 (H) 01/15/2020 0939   BUN 9 01/15/2020 0939   CREATININE 0.72 (L) 01/15/2020 0939   CALCIUM 10.0 01/15/2020 0939   GFRNONAA 98 01/15/2020 0939   GFRAA 113 01/15/2020 0939    No results found for: WBC, RBC, HGB, HCT, PLT, MCV, MCH, MCHC, RDW, LYMPHSABS, MONOABS, EOSABS, BASOSABS  No results found for: POCLITH, LITHIUM   No results found for: PHENYTOIN, PHENOBARB, VALPROATE, CBMZ   .res Assessment: Plan:    Kyle Velazquez was seen today for follow-up and generalized anxiety disorder.  Diagnoses and all orders for this visit:  Generalized anxiety disorder -     ALPRAZolam (XANAX) 1 MG tablet; TAKE 1/2 TABLET BY MOUTH FIVE TIMES DAILY -     sertraline (ZOLOFT) 25 MG tablet; Take 1 tablet (25 mg total) by mouth daily.   We discussed the short-term risks associated with benzodiazepines including sedation and increased fall risk among others.  Discussed long-term side effect risk including dependence, potential withdrawal symptoms, and the potential eventual dose-related  risk of dementia.  But recent studies from 2020 dispute this association between benzodiazepines and dementia risk. Newer studies in 2020 do not support an association with dementia.  Benefit from meds.  No complaints.  Tolerating.  Benefit with sertraline and satisfied.  Discussed the pros and cons of potentially weaning the sertraline at his next visit.  He does not feel that he can wean the alprazolam with or without sertraline presents.  He did well for years with just alprazolam until business stress got out of hand.  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   Therefore no med changes today but consider stoppting sertraline at FU  FU 6 mos  Meredith Staggers, MD, DFAPA   Please see After Visit Summary for patient specific instructions.  Future Appointments  Date Time Provider Department Center  01/15/2021  9:20 AM Nahser, Deloris Ping, MD [facility]    No orders of the defined types were placed in this encounter.     -------------------------------

## 2021-01-15 ENCOUNTER — Ambulatory Visit: Payer: BC Managed Care – PPO | Admitting: Cardiovascular Disease

## 2021-01-23 DIAGNOSIS — Z13820 Encounter for screening for osteoporosis: Secondary | ICD-10-CM | POA: Diagnosis not present

## 2021-01-23 DIAGNOSIS — Z129 Encounter for screening for malignant neoplasm, site unspecified: Secondary | ICD-10-CM | POA: Diagnosis not present

## 2021-01-23 DIAGNOSIS — Z723 Lack of physical exercise: Secondary | ICD-10-CM | POA: Diagnosis not present

## 2021-01-23 DIAGNOSIS — Z13228 Encounter for screening for other metabolic disorders: Secondary | ICD-10-CM | POA: Diagnosis not present

## 2021-01-23 DIAGNOSIS — Z13220 Encounter for screening for lipoid disorders: Secondary | ICD-10-CM | POA: Diagnosis not present

## 2021-01-23 DIAGNOSIS — Z136 Encounter for screening for cardiovascular disorders: Secondary | ICD-10-CM | POA: Diagnosis not present

## 2021-01-23 DIAGNOSIS — Z0000 Encounter for general adult medical examination without abnormal findings: Secondary | ICD-10-CM | POA: Diagnosis not present

## 2021-01-23 DIAGNOSIS — Z0100 Encounter for examination of eyes and vision without abnormal findings: Secondary | ICD-10-CM | POA: Diagnosis not present

## 2021-01-23 DIAGNOSIS — Z713 Dietary counseling and surveillance: Secondary | ICD-10-CM | POA: Diagnosis not present

## 2021-02-16 ENCOUNTER — Other Ambulatory Visit: Payer: Self-pay | Admitting: Cardiovascular Disease

## 2021-02-26 DIAGNOSIS — E1065 Type 1 diabetes mellitus with hyperglycemia: Secondary | ICD-10-CM | POA: Diagnosis not present

## 2021-03-11 DIAGNOSIS — B372 Candidiasis of skin and nail: Secondary | ICD-10-CM | POA: Diagnosis not present

## 2021-03-24 DIAGNOSIS — E785 Hyperlipidemia, unspecified: Secondary | ICD-10-CM | POA: Diagnosis not present

## 2021-03-24 DIAGNOSIS — I10 Essential (primary) hypertension: Secondary | ICD-10-CM | POA: Diagnosis not present

## 2021-03-24 DIAGNOSIS — E291 Testicular hypofunction: Secondary | ICD-10-CM | POA: Diagnosis not present

## 2021-03-24 DIAGNOSIS — E559 Vitamin D deficiency, unspecified: Secondary | ICD-10-CM | POA: Diagnosis not present

## 2021-03-24 DIAGNOSIS — E1040 Type 1 diabetes mellitus with diabetic neuropathy, unspecified: Secondary | ICD-10-CM | POA: Diagnosis not present

## 2021-03-24 DIAGNOSIS — Z125 Encounter for screening for malignant neoplasm of prostate: Secondary | ICD-10-CM | POA: Diagnosis not present

## 2021-04-17 ENCOUNTER — Other Ambulatory Visit: Payer: Self-pay | Admitting: Psychiatry

## 2021-04-17 DIAGNOSIS — F411 Generalized anxiety disorder: Secondary | ICD-10-CM

## 2021-04-17 DIAGNOSIS — K644 Residual hemorrhoidal skin tags: Secondary | ICD-10-CM | POA: Diagnosis not present

## 2021-04-24 DIAGNOSIS — N4341 Spermatocele of epididymis, single: Secondary | ICD-10-CM | POA: Diagnosis not present

## 2021-05-04 DIAGNOSIS — E103293 Type 1 diabetes mellitus with mild nonproliferative diabetic retinopathy without macular edema, bilateral: Secondary | ICD-10-CM | POA: Diagnosis not present

## 2021-05-04 DIAGNOSIS — H353131 Nonexudative age-related macular degeneration, bilateral, early dry stage: Secondary | ICD-10-CM | POA: Diagnosis not present

## 2021-05-04 DIAGNOSIS — H40013 Open angle with borderline findings, low risk, bilateral: Secondary | ICD-10-CM | POA: Diagnosis not present

## 2021-05-04 DIAGNOSIS — H35372 Puckering of macula, left eye: Secondary | ICD-10-CM | POA: Diagnosis not present

## 2021-05-19 ENCOUNTER — Other Ambulatory Visit: Payer: Self-pay | Admitting: *Deleted

## 2021-05-19 MED ORDER — POTASSIUM CHLORIDE CRYS ER 20 MEQ PO TBCR: EXTENDED_RELEASE_TABLET | ORAL | 0 refills | Status: DC

## 2021-06-15 ENCOUNTER — Other Ambulatory Visit: Payer: Self-pay | Admitting: Psychiatry

## 2021-06-15 DIAGNOSIS — F411 Generalized anxiety disorder: Secondary | ICD-10-CM

## 2021-06-16 ENCOUNTER — Other Ambulatory Visit: Payer: Self-pay

## 2021-06-16 ENCOUNTER — Encounter: Payer: Self-pay | Admitting: Psychiatry

## 2021-06-16 ENCOUNTER — Ambulatory Visit (INDEPENDENT_AMBULATORY_CARE_PROVIDER_SITE_OTHER): Payer: Medicare Other | Admitting: Psychiatry

## 2021-06-16 DIAGNOSIS — F411 Generalized anxiety disorder: Secondary | ICD-10-CM | POA: Diagnosis not present

## 2021-06-16 MED ORDER — ALPRAZOLAM 1 MG PO TABS: ORAL_TABLET | ORAL | 5 refills | Status: DC

## 2021-06-16 MED ORDER — SERTRALINE HCL 25 MG PO TABS: ORAL_TABLET | ORAL | 3 refills | Status: DC

## 2021-06-16 NOTE — Progress Notes (Signed)
Kyle Velazquez [id number] 1956/02/03 65 y.o.   Subjective:   Patient ID:  Kyle Velazquez is a 65 y.o. (DOB 1956/05/01) male.  Chief Complaint:  Chief Complaint  Patient presents with   Follow-up   Generalized anxiety disorder    HPI Kyle Velazquez presents to the office today for follow-up of GAD.  seen 10/2019.  No meds were changed.  04/15/2020 appt with the following noted: Good summer.   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   A lot less stress. Worked out well.  No black cloud on Sunday afternoons anymore.   Kyle Velazquez is good.  Kyle in [place]. Kyle Velazquez and her BF in [place] moved back to [place].  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   Kyle Velazquez  just turned 65 yo and they moved to [place].  Got to see kids at holidays.  Son doing really well with big promotion.   No other complaints.  Doesn't have control over the time line.  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  Satisfied with meds.   Down to 2 and 1/2 tablets of Xanax daily from 4 and 1/2 tablets daily. Anxiety manageable.  Chronic issues.  Does well with the meds.  Aware of risks the Bz.   Patient reports stable mood and denies depressed or irritable moods.  Patient denies any recent difficulty with anxiety.  Patient denies difficulty with sleep initiation or maintenance. Denies appetite disturbance.  Patient reports that energy and motivation have been good.  Patient denies any difficulty with concentration.  Patient denies any suicidal ideation.   12/15/2020 appt noted: Doing great in retirement.  So great to have that off him.  Ready to retire this time.  No complaints.  No black clouds. No problems with meds. Patient reports stable mood and denies depressed or irritable moods.  Patient denies any recent difficulty with anxiety.  Patient denies difficulty with sleep initiation or maintenance. Denies appetite disturbance.  Patient reports that energy and motivation have  been good.  Patient denies any difficulty with concentration.  Patient denies any suicidal ideation. Satisfied with sertraline 25 mg.  Celiac dx for 3 years.If eats clean GI is OK.  So not related to sertraline. Plan: Therefore no med changes today but consider stoppting sertraline at FU  06/16/21 appt noted: Good summer.  Son moved to [place].  Kyle pregnant.  Did [place] trip.  Celiac dz is a problem eating.  Been to [place] and enjoyed it. Things doing well.  Loves being retired.  Helping a little in work. Anxiety is good.  Patient reports stable mood and denies depressed or irritable moods.  Patient denies any recent difficulty with anxiety.  Patient denies difficulty with sleep initiation or maintenance. Denies appetite disturbance.  Patient reports that energy and motivation have been good.  Patient denies any difficulty with concentration.  Patient denies any suicidal ideation. Don't see reason to go off or up on meds.  No SE with meds.  Past Psychiatric Medication Trials: Sertraline, Lexapro, buspirone, Xanax with a long history of 1 mg 4 times daily.  He has been able to reduce it to half a milligram 5 times daily Under my psychiatric care since 2002  Review of Systems:  Review of Systems  Cardiovascular:  Negative for palpitations.  Neurological:  Negative for tremors and weakness.  Psychiatric/Behavioral:  Negative for agitation, behavioral problems, confusion, decreased concentration, dysphoric  mood, hallucinations, self-injury, sleep disturbance and suicidal ideas. The patient is not nervous/anxious and is not hyperactive.    Medications: I have reviewed the patient's current medications.  Current Outpatient Medications  Medication Sig Dispense Refill   amLODipine (NORVASC) 10 MG tablet Take 10 mg by mouth daily.     Continuous Blood Gluc Sensor (FREESTYLE LIBRE 14 DAY SENSOR) MISC by Does not apply route.     glucose blood test strip 1 each by Other route as needed for other.  Use as instructed     hydrochlorothiazide (HYDRODIURIL) 25 MG tablet Take 1 tablet (25 mg total) by mouth daily. Patient needs appointment for any future refills.  Please call office at [phone] to schedule appointment. 1st attempt. 30 tablet 0   hyoscyamine (LEVSIN SL) 0.125 MG SL tablet Place 1 tablet (0.125 mg total) under the tongue every 4 (four) hours as needed. 30 tablet 2   insulin aspart (NOVOLOG) 100 UNIT/ML injection Inject into the skin 3 (three) times daily before meals. INJECT UP TO 70 UNITS DAILY ON A SLIDING SCALE     omeprazole (PRILOSEC) 40 MG capsule Take 40 mg by mouth daily.     PERCOCET 10-325 MG tablet Take 1 tablet by mouth every 6 (six) hours as needed. AS NEEDED FOR PAIN  0   potassium chloride SA (KLOR-CON) 20 MEQ tablet TAKE 1 TABLET(20 MEQ) BY MOUTH DAILY 30 tablet 0   Testosterone 20.25 MG/ACT (1.62%) GEL SMARTSIG:3 Pump Topical Daily     valACYclovir (VALTREX) 500 MG tablet Take 500 mg by mouth daily.     ALPRAZolam (XANAX) 1 MG tablet TAKE 1/2 TABLET BY MOUTH FIVE TIMES DAILY 75 tablet 5   betamethasone dipropionate 0.05 % cream Apply 1 drop topically 2 (two) times daily. (Patient not taking: No sig reported)     ramipril (ALTACE) 5 MG capsule Take 5 mg by mouth daily. (Patient not taking: Reported on 06/16/2021)     sertraline (ZOLOFT) 25 MG tablet TAKE 1 TABLET(25 MG) BY MOUTH DAILY 90 tablet 3   No current facility-administered medications for this visit.    Medication Side Effects: None; no sedation.  Allergies: No Known Allergies  Past Medical History:  Diagnosis Date   Celiac disease    Chronic back pain    Chronic shoulder pain    Diabetes 1.5, managed as type 2 (HCC)    Diabetic neuropathy (HCC)    Hyperlipidemia    Hypertension    Internal hemorrhoids     Family History  Problem Relation Age of Onset   CAD Mother    CAD Father     Social History   Socioeconomic History   Marital status: Married    Spouse name: Not on file    Number of children: Not on file   Years of education: Not on file   Highest education level: Not on file  Occupational History   Not on file  Tobacco Use   Smoking status: Former   Smokeless tobacco: Never  Substance and Sexual Activity   Alcohol use: No   Drug use: No   Sexual activity: Yes  Other Topics Concern   Not on file  Social History Narrative   Not on file   Social Determinants of Health   Financial Resource Strain: Not on file  Food Insecurity: Not on file  Transportation Needs: Not on file  Physical Activity: Not on file  Stress: Not on file  Social Connections: Not on file  Intimate Partner Violence:  Not on file    Past Medical History, Surgical history, Social history, and Family history were reviewed and updated as appropriate.   Please see review of systems for further details on the patient's review from today.   Objective:   Physical Exam:  There were no vitals taken for this visit.  Physical Exam Constitutional:      General: He is not in acute distress. Musculoskeletal:        General: No deformity.  Neurological:     Mental Status: He is alert and oriented to person, place, and time.     Cranial Nerves: No dysarthria.     Coordination: Coordination normal.  Psychiatric:        Attention and Perception: Attention and perception normal. He does not perceive auditory or visual hallucinations.        Mood and Affect: Mood normal. Mood is not anxious or depressed. Affect is not labile, blunt, angry or inappropriate.        Speech: Speech normal.        Behavior: Behavior normal. Behavior is cooperative.        Thought Content: Thought content normal. Thought content is not paranoid or delusional. Thought content does not include homicidal or suicidal ideation. Thought content does not include homicidal or suicidal plan.        Cognition and Memory: Cognition and memory normal.        Judgment: Judgment normal.     Comments: Insight intact Further  reduction in anxiety with the pending end of job stress.    Lab Review:     Component Value Date/Time   NA 135 01/15/2020 0939   K 4.3 01/15/2020 0939   CL 97 01/15/2020 0939   CO2 27 01/15/2020 0939   GLUCOSE 185 (H) 01/15/2020 0939   BUN 9 01/15/2020 0939   CREATININE 0.72 (L) 01/15/2020 0939   CALCIUM 10.0 01/15/2020 0939   GFRNONAA 98 01/15/2020 0939   GFRAA 113 01/15/2020 0939    No results found for: WBC, RBC, HGB, HCT, PLT, MCV, MCH, MCHC, RDW, LYMPHSABS, MONOABS, EOSABS, BASOSABS  No results found for: POCLITH, LITHIUM   No results found for: PHENYTOIN, PHENOBARB, VALPROATE, CBMZ   .res Assessment: Plan:    Kyle Velazquez was seen today for follow-up and generalized anxiety disorder.  Diagnoses and all orders for this visit:  Generalized anxiety disorder -     ALPRAZolam (XANAX) 1 MG tablet; TAKE 1/2 TABLET BY MOUTH FIVE TIMES DAILY -     sertraline (ZOLOFT) 25 MG tablet; TAKE 1 TABLET(25 MG) BY MOUTH DAILY  We discussed the short-term risks associated with benzodiazepines including sedation and increased fall risk among others.  Discussed long-term side effect risk including dependence, potential withdrawal symptoms, and the potential eventual dose-related risk of dementia.  But recent studies from 2020 dispute this association between benzodiazepines and dementia risk. Newer studies in 2020 do not support an association with dementia.  Benefit from meds.  No complaints.  Tolerating.  Benefit with sertraline and satisfied.  Discussed the pros and cons of potentially weaning the sertraline at his next visit.  He does not feel that he can wean the alprazolam with or without sertraline presents.  He did well for years with just alprazolam until business stress got out of hand.  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   Therefore no med changes today   FU 9 mos  Meredith Staggers, MD, DFAPA   Please see After Visit  Summary for patient specific instructions.  No  future appointments.   No orders of the defined types were placed in this encounter.     -------------------------------

## 2021-07-17 ENCOUNTER — Other Ambulatory Visit: Payer: Self-pay | Admitting: Cardiovascular Disease

## 2021-09-15 NOTE — Progress Notes (Signed)
Cardiology Office Note:    Date:  09/16/2021   ID:  Kyle Velazquez, DOB 08-02-1956, MRN [id number]  PCP:  Reynold Bowen, MD  Cardiologist:  Alliana Mcauliff  Electrophysiologist:  None   Referring MD: Reynold Bowen, MD   Chief Complaint  Patient presents with   Hypertension         Previous notes:   Kyle Velazquez is a 66 y.o. male with a hx of hypertension .  I saw him via telemedicine last year.  He has been exercising regularly . Exercised regularly   Did have 1 episode of DOE while walking to his car - a month ago  Has not occurred since. No CP.  Jan. 4, 2023 Kyle Velazquez is seen for follow up of his HTN. BP is a bit elevated.  120-160 at home Has celiac and DM so he eats lots of soup  ( makes his own soup)  BP is a bit elevated  Will DC ramipril Start valsartan 160 mg  a day   Past Medical History:  Diagnosis Date   Celiac disease    Chronic back pain    Chronic shoulder pain    Diabetes 1.5, managed as type 2 ([place])    Diabetic neuropathy ([place])    Hyperlipidemia    Hypertension    Internal hemorrhoids     Past Surgical History:  Procedure Laterality Date   COLONOSCOPY  2007 2016   Dr Earlean Shawl    ESOPHAGOGASTRODUODENOSCOPY  2011   Dr Earlean Shawl    NECK SURGERY     SHOULDER SURGERY  2009    Current Medications: Current Meds  Medication Sig   ALPRAZolam (XANAX) 1 MG tablet TAKE 1/2 TABLET BY MOUTH FIVE TIMES DAILY   amLODipine (NORVASC) 10 MG tablet Take 10 mg by mouth daily.   betamethasone dipropionate 0.05 % cream Apply 1 drop topically 2 (two) times daily.   Continuous Blood Gluc Sensor (FREESTYLE LIBRE 14 DAY SENSOR) MISC by Does not apply route.   glucose blood test strip 1 each by Other route as needed for other. Use as instructed   hydrocortisone 2.5 % cream Apply topically 2 (two) times daily as needed. Apply topically 2 (two) times daily as needed.   hyoscyamine (LEVSIN SL) 0.125 MG SL tablet Place 1 tablet (0.125 mg total) under the tongue every 4 (four)  hours as needed.   insulin aspart (NOVOLOG) 100 UNIT/ML injection Inject into the skin 3 (three) times daily before meals. INJECT UP TO 70 UNITS DAILY ON A SLIDING SCALE   magnesium oxide (MAG-OX) 400 MG tablet 1 tablet as needed   omeprazole (PRILOSEC) 40 MG capsule Take 40 mg by mouth daily.   PERCOCET 10-325 MG tablet Take 1 tablet by mouth every 6 (six) hours as needed. AS NEEDED FOR PAIN   sertraline (ZOLOFT) 25 MG tablet TAKE 1 TABLET(25 MG) BY MOUTH DAILY   sildenafil (VIAGRA) 50 MG tablet 1 Tablet(s) By Mouth   Testosterone 20.25 MG/ACT (1.62%) GEL SMARTSIG:3 Pump Topical Daily   valACYclovir (VALTREX) 500 MG tablet Take 500 mg by mouth daily.   valsartan (DIOVAN) 160 MG tablet Take 1 tablet (160 mg total) by mouth daily.   [DISCONTINUED] hydrochlorothiazide (HYDRODIURIL) 25 MG tablet Take 1 tablet (25 mg total) by mouth daily. Patient needs appointment for any future refills.  Please call office at [phone] to schedule appointment. 1st attempt.   [DISCONTINUED] potassium chloride SA (KLOR-CON) 20 MEQ tablet TAKE 1 TABLET(20 MEQ) BY MOUTH DAILY   [DISCONTINUED] ramipril (ALTACE)  5 MG capsule Take 5 mg by mouth daily.     Allergies:   Patient has no known allergies.   Social History   Socioeconomic History   Marital status: Married    Spouse name: Not on file   Number of children: Not on file   Years of education: Not on file   Highest education level: Not on file  Occupational History   Not on file  Tobacco Use   Smoking status: Former   Smokeless tobacco: Never  Substance and Sexual Activity   Alcohol use: No   Drug use: No   Sexual activity: Yes  Other Topics Concern   Not on file  Social History Narrative   Not on file   Social Determinants of Health   Financial Resource Strain: Not on file  Food Insecurity: Not on file  Transportation Needs: Not on file  Physical Activity: Not on file  Stress: Not on file  Social Connections: Not on file     Family  History: The patient's family history includes CAD in his father and mother.  ROS:   Please see the history of present illness.     All other systems reviewed and are negative.  EKGs/Labs/Other Studies Reviewed:    The following studies were reviewed today:   Recent Labs: No results found for requested labs within last 8760 hours.  Recent Lipid Panel No results found for: CHOL, TRIG, HDL, CHOLHDL, VLDL, LDLCALC, LDLDIRECT  Physical Exam:    Physical Exam: Blood pressure 140/80, pulse (!) 51, height 5\' 6"  (1.676 m), weight 146 lb (66.2 kg), SpO2 99 %.  GEN:  Well nourished, well developed in no acute distress HEENT: Normal , long beard  NECK: No JVD; No carotid bruits LYMPHATICS: No lymphadenopathy CARDIAC: RRR , no murmurs, rubs, gallops RESPIRATORY:  Clear to auscultation without rales, wheezing or rhonchi  ABDOMEN: Soft, non-tender, non-distended MUSCULOSKELETAL:  No edema; No deformity  SKIN: Warm and dry NEUROLOGIC:  Alert and oriented x 3   EKG:   September 16, 2021: Sinus bradycardia at 51.  No ST or T wave changes.  ASSESSMENT:    1. Essential hypertension     PLAN:    In order of problems listed above:  HTN:   He eats a lot of homemade soup because of his celiac disease.  We will discontinue the ramipril.  Start him on valsartan 160 mg a day.  Will refill his HCTZ and kdur .   Cont current meds Cont exercise  Follow up In a year with APP or ME    Medication Adjustments/Labs and Tests Ordered: Current medicines are reviewed at length with the patient today.  Concerns regarding medicines are outlined above.  Orders Placed This Encounter  Procedures   Basic metabolic panel   EKG [id number]   Meds ordered this encounter  Medications   valsartan (DIOVAN) 160 MG tablet    Sig: Take 1 tablet (160 mg total) by mouth daily.    Dispense:  90 tablet    Refill:  3   hydrochlorothiazide (HYDRODIURIL) 25 MG tablet    Sig: Take 1 tablet (25 mg total) by mouth  daily. Patient needs appointment for any future refills.  Please call office at [phone] to schedule appointment. 1st attempt.    Dispense:  90 tablet    Refill:  3   potassium chloride SA (KLOR-CON M) 20 MEQ tablet    Sig: Take 1 tablet (20 mEq total) by mouth daily.  Dispense:  90 tablet    Refill:  3    **Patient requests 90 days supply**     Patient Instructions  Medication Instructions:  Your physician has recommended you make the following change in your medication:  STOP: Ramipri  START: Valsartan 160 mg by mouth daily  REFILLLED: HCTZ and Potassium  *If you need a refill on your cardiac medications before your next appointment, please call your pharmacy*   Lab Work: IN 3 WEEKS: BMP If you have labs (blood work) drawn today and your tests are completely normal, you will receive your results only by: [place] (if you have MyChart) OR A paper copy in the mail If you have any lab test that is abnormal or we need to change your treatment, we will call you to review the results.   Testing/Procedures: NONE   Follow-Up: At [facility], you and your health needs are our priority.  As part of our continuing mission to provide you with exceptional heart care, we have created designated Provider Care Teams.  These Care Teams include your primary Cardiologist (physician) and Advanced Practice Providers (APPs -  Physician Assistants and Nurse Practitioners) who all work together to provide you with the care you need, when you need it.  We recommend signing up for the patient portal called "MyChart".  Sign up information is provided on this After Visit Summary.  MyChart is used to connect with patients for Virtual Visits (Telemedicine).  Patients are able to view lab/test results, encounter notes, upcoming appointments, etc.  Non-urgent messages can be sent to your provider as well.   To learn more about what you can do with MyChart, go to [email or web address].     Your next appointment:   6 month(s)  The format for your next appointment:   In Person  Provider:   Robbie Lis, PA-C or Richardson Dopp, PA-C         Signed, Mertie Moores, MD  09/16/2021 12:29 PM    [place]

## 2021-09-16 ENCOUNTER — Other Ambulatory Visit: Payer: Self-pay

## 2021-09-16 ENCOUNTER — Ambulatory Visit (INDEPENDENT_AMBULATORY_CARE_PROVIDER_SITE_OTHER): Payer: Medicare Other | Admitting: Cardiovascular Disease

## 2021-09-16 ENCOUNTER — Encounter: Payer: Self-pay | Admitting: Cardiovascular Disease

## 2021-09-16 VITALS — BP 140/80 | HR 51 | Ht 66.0 in | Wt 146.0 lb

## 2021-09-16 DIAGNOSIS — I10 Essential (primary) hypertension: Secondary | ICD-10-CM | POA: Diagnosis not present

## 2021-09-16 MED ORDER — HYDROCHLOROTHIAZIDE 25 MG PO TABS: 25.0000 mg | ORAL_TABLET | Freq: Every day | ORAL | 3 refills | Status: AC

## 2021-09-16 MED ORDER — POTASSIUM CHLORIDE CRYS ER 20 MEQ PO TBCR: 20.0000 meq | EXTENDED_RELEASE_TABLET | Freq: Every day | ORAL | 3 refills | Status: DC

## 2021-09-16 MED ORDER — VALSARTAN 160 MG PO TABS: 160.0000 mg | ORAL_TABLET | Freq: Every day | ORAL | 3 refills | Status: DC

## 2021-09-16 NOTE — Patient Instructions (Signed)
Medication Instructions:  Your physician has recommended you make the following change in your medication:  STOP: Ramipri  START: Valsartan 160 mg by mouth daily  REFILLLED: HCTZ and Potassium  *If you need a refill on your cardiac medications before your next appointment, please call your pharmacy*   Lab Work: IN 3 WEEKS: BMP If you have labs (blood work) drawn today and your tests are completely normal, you will receive your results only by: MyChart Message (if you have MyChart) OR A paper copy in the mail If you have any lab test that is abnormal or we need to change your treatment, we will call you to review the results.   Testing/Procedures: NONE   Follow-Up: At [facility], you and your health needs are our priority.  As part of our continuing mission to provide you with exceptional heart care, we have created designated Provider Care Teams.  These Care Teams include your primary Cardiologist (physician) and Advanced Practice Providers (APPs -  Physician Assistants and Nurse Practitioners) who all work together to provide you with the care you need, when you need it.  We recommend signing up for the patient portal called "MyChart".  Sign up information is provided on this After Visit Summary.  MyChart is used to connect with patients for Virtual Visits (Telemedicine).  Patients are able to view lab/test results, encounter notes, upcoming appointments, etc.  Non-urgent messages can be sent to your provider as well.   To learn more about what you can do with MyChart, go to [email or web address].    Your next appointment:   6 month(s)  The format for your next appointment:   In Person  Provider:   Chelsea Aus, PA-C or Tereso Newcomer, PA-C

## 2021-10-09 ENCOUNTER — Other Ambulatory Visit: Payer: Medicare Other | Admitting: *Deleted

## 2021-10-09 ENCOUNTER — Other Ambulatory Visit: Payer: Self-pay

## 2021-10-09 DIAGNOSIS — I10 Essential (primary) hypertension: Secondary | ICD-10-CM

## 2021-10-09 LAB — BASIC METABOLIC PANEL
BUN/Creatinine Ratio: 19 (ref 10–24)
BUN: 13 mg/dL (ref 8–27)
CO2: 30 mmol/L — ABNORMAL HIGH (ref 20–29)
Calcium: 9.8 mg/dL (ref 8.6–10.2)
Chloride: 95 mmol/L — ABNORMAL LOW (ref 96–106)
Creatinine, Ser: 0.68 mg/dL — ABNORMAL LOW (ref 0.76–1.27)
Glucose: 257 mg/dL — ABNORMAL HIGH (ref 70–99)
Potassium: 4.6 mmol/L (ref 3.5–5.2)
Sodium: 135 mmol/L (ref 134–144)
eGFR: 102 mL/min/{1.73_m2} (ref >59)

## 2021-10-11 ENCOUNTER — Encounter: Payer: Self-pay | Admitting: Cardiovascular Disease

## 2021-10-29 ENCOUNTER — Other Ambulatory Visit: Payer: Self-pay | Admitting: Otolaryngology

## 2021-10-29 DIAGNOSIS — R0989 Other specified symptoms and signs involving the circulatory and respiratory systems: Secondary | ICD-10-CM

## 2021-11-03 ENCOUNTER — Ambulatory Visit
Admission: RE | Admit: 2021-11-03 | Discharge: 2021-11-03 | Disposition: A | Discharge status: Home / Self Care | Payer: Medicare Other | Source: Ambulatory Visit | Attending: Otolaryngology | Admitting: Otolaryngology

## 2021-11-03 DIAGNOSIS — R0989 Other specified symptoms and signs involving the circulatory and respiratory systems: Secondary | ICD-10-CM

## 2021-11-10 ENCOUNTER — Telehealth: Payer: Self-pay | Admitting: Cardiovascular Disease

## 2021-11-10 DIAGNOSIS — R609 Edema, unspecified: Secondary | ICD-10-CM

## 2021-11-10 DIAGNOSIS — I10 Essential (primary) hypertension: Secondary | ICD-10-CM

## 2021-11-10 MED ORDER — VALSARTAN 320 MG PO TABS: 320.0000 mg | ORAL_TABLET | Freq: Every day | ORAL | 3 refills | Status: AC

## 2021-11-10 MED ORDER — AMLODIPINE BESYLATE 5 MG PO TABS: 5.0000 mg | ORAL_TABLET | Freq: Every day | ORAL | 3 refills | Status: DC

## 2021-11-10 NOTE — Telephone Encounter (Signed)
Joellen Jersey, NP at [facility] called in today to speak with Dr Acie Fredrickson regarding concerns for pt's new onset weight gain. Per Joellen Jersey, Pt states this morning via video visit, that he has gained 5 pounds over last 10 days. Baseline weight is 139-140lb and dry weight this morning was 144.4lb. Pt denies SOB, cough, or CP and Katie notes that pt is speaking unlabored, in clear sentences. On assessment, the NP noted that pt has BLE edema in feet and ankles. L dorsal edema is +1 with poor ankle definition and R foot has trace edema. Pt states he has been wearing compression socks and expresses that the swelling was much worse prior to use. BP today was 123XX123 systolic, no pulse provided. Joellen Jersey, NP is seeking recommendation of Nahser (primary cardiologist) because she is concerned regarding need for ECHO or potential CHF developing. She does state that this could be due to Norvasc use, especially in light of decreasing HCTZ total daily dose to 25mg  (pt previously on zestoretic as well, so was taking total of 50mg  HCTZ daily). Requesting return call from our office so that she can advise pt on what is best [phone]). Will route to [employer].

## 2021-11-10 NOTE — Telephone Encounter (Signed)
Nahser, Wonda Cheng, MD  You 52 minutes ago (4:41 PM)   I have talked with Joellen Jersey and given instructions  To reduce  amlodipine to 5 mg a day  Increase Diovan to 360 mg a day  Take an extra dose of HCTZ tomorrow   Lets get an echo in a week or so  Please also get a BMP in a week or so   He needs to avoid his [place] restaurant  for now   Thanks   PN   Verified with Dr Acie Fredrickson that he did mean Diovan 320mg , not 360mg .   Called and gave all recommendations to patient. Pt understands and did readback for his med changes. BMP ordered and scheduled for 3/7 and ECHO order placed.

## 2021-11-10 NOTE — Telephone Encounter (Signed)
Kyle Velazquez had virtual visit with pt regarding 5 lbs weight gain in 10 days. He has edema in both ankles and feet, she is concerned with heart failure thinks it is related to hydrochlorothiazide. Patient is also type 1 diabetic. Patient was not having these issues when last seen by our office so she is wanting to discuss if the decrease made in the hydrochlorothiazide should be changed. Contact number is NP's call. Please advise.

## 2021-11-16 ENCOUNTER — Other Ambulatory Visit: Payer: Self-pay

## 2021-11-16 ENCOUNTER — Inpatient Hospital Stay (HOSPITAL_BASED_OUTPATIENT_CLINIC_OR_DEPARTMENT_OTHER)
Admission: EM | Admit: 2021-11-16 | Discharge: 2021-11-18 | DRG: 871 | Disposition: A | Discharge status: Home / Self Care | Payer: Medicare Other | Attending: Family Medicine | Admitting: Family Medicine

## 2021-11-16 ENCOUNTER — Emergency Department (HOSPITAL_BASED_OUTPATIENT_CLINIC_OR_DEPARTMENT_OTHER): Payer: Medicare Other

## 2021-11-16 ENCOUNTER — Encounter (HOSPITAL_BASED_OUTPATIENT_CLINIC_OR_DEPARTMENT_OTHER): Payer: Self-pay | Admitting: Emergency Medicine

## 2021-11-16 ENCOUNTER — Emergency Department (HOSPITAL_BASED_OUTPATIENT_CLINIC_OR_DEPARTMENT_OTHER): Payer: Medicare Other | Admitting: Radiology

## 2021-11-16 DIAGNOSIS — J918 Pleural effusion in other conditions classified elsewhere: Secondary | ICD-10-CM | POA: Diagnosis present

## 2021-11-16 DIAGNOSIS — E1040 Type 1 diabetes mellitus with diabetic neuropathy, unspecified: Secondary | ICD-10-CM | POA: Diagnosis present

## 2021-11-16 DIAGNOSIS — Z8249 Family history of ischemic heart disease and other diseases of the circulatory system: Secondary | ICD-10-CM

## 2021-11-16 DIAGNOSIS — G8929 Other chronic pain: Secondary | ICD-10-CM | POA: Diagnosis present

## 2021-11-16 DIAGNOSIS — M549 Dorsalgia, unspecified: Secondary | ICD-10-CM | POA: Diagnosis present

## 2021-11-16 DIAGNOSIS — E876 Hypokalemia: Secondary | ICD-10-CM

## 2021-11-16 DIAGNOSIS — E1065 Type 1 diabetes mellitus with hyperglycemia: Secondary | ICD-10-CM | POA: Diagnosis present

## 2021-11-16 DIAGNOSIS — Z20822 Contact with and (suspected) exposure to covid-19: Secondary | ICD-10-CM | POA: Diagnosis present

## 2021-11-16 DIAGNOSIS — E871 Hypo-osmolality and hyponatremia: Secondary | ICD-10-CM | POA: Diagnosis present

## 2021-11-16 DIAGNOSIS — G9341 Metabolic encephalopathy: Secondary | ICD-10-CM | POA: Diagnosis present

## 2021-11-16 DIAGNOSIS — Z79899 Other long term (current) drug therapy: Secondary | ICD-10-CM | POA: Diagnosis not present

## 2021-11-16 DIAGNOSIS — E860 Dehydration: Secondary | ICD-10-CM | POA: Diagnosis present

## 2021-11-16 DIAGNOSIS — I10 Essential (primary) hypertension: Secondary | ICD-10-CM | POA: Diagnosis present

## 2021-11-16 DIAGNOSIS — F411 Generalized anxiety disorder: Secondary | ICD-10-CM | POA: Diagnosis present

## 2021-11-16 DIAGNOSIS — J189 Pneumonia, unspecified organism: Secondary | ICD-10-CM | POA: Diagnosis present

## 2021-11-16 DIAGNOSIS — Z9641 Presence of insulin pump (external) (internal): Secondary | ICD-10-CM | POA: Diagnosis present

## 2021-11-16 DIAGNOSIS — E222 Syndrome of inappropriate secretion of antidiuretic hormone: Secondary | ICD-10-CM | POA: Diagnosis present

## 2021-11-16 DIAGNOSIS — K900 Celiac disease: Secondary | ICD-10-CM | POA: Diagnosis present

## 2021-11-16 DIAGNOSIS — Z87891 Personal history of nicotine dependence: Secondary | ICD-10-CM | POA: Diagnosis not present

## 2021-11-16 DIAGNOSIS — E878 Other disorders of electrolyte and fluid balance, not elsewhere classified: Secondary | ICD-10-CM | POA: Diagnosis present

## 2021-11-16 DIAGNOSIS — R6520 Severe sepsis without septic shock: Secondary | ICD-10-CM | POA: Diagnosis present

## 2021-11-16 DIAGNOSIS — Z794 Long term (current) use of insulin: Secondary | ICD-10-CM

## 2021-11-16 DIAGNOSIS — E785 Hyperlipidemia, unspecified: Secondary | ICD-10-CM | POA: Diagnosis present

## 2021-11-16 DIAGNOSIS — R748 Abnormal levels of other serum enzymes: Secondary | ICD-10-CM | POA: Diagnosis present

## 2021-11-16 DIAGNOSIS — A419 Sepsis, unspecified organism: Principal | ICD-10-CM | POA: Diagnosis present

## 2021-11-16 DIAGNOSIS — I160 Hypertensive urgency: Secondary | ICD-10-CM | POA: Diagnosis not present

## 2021-11-16 DIAGNOSIS — R5381 Other malaise: Secondary | ICD-10-CM | POA: Diagnosis not present

## 2021-11-16 DIAGNOSIS — E109 Type 1 diabetes mellitus without complications: Secondary | ICD-10-CM

## 2021-11-16 LAB — COMPREHENSIVE METABOLIC PANEL
ALT: 36 U/L (ref 0–44)
AST: 32 U/L (ref 15–41)
Albumin: 3.5 g/dL (ref 3.5–5.0)
Alkaline Phosphatase: 207 U/L — ABNORMAL HIGH (ref 38–126)
Anion gap: 13 (ref 5–15)
BUN: 35 mg/dL — ABNORMAL HIGH (ref 8–23)
CO2: 27 mmol/L (ref 22–32)
Calcium: 10.5 mg/dL — ABNORMAL HIGH (ref 8.9–10.3)
Chloride: 90 mmol/L — ABNORMAL LOW (ref 98–111)
Creatinine, Ser: 0.8 mg/dL (ref 0.61–1.24)
GFR, Estimated: >60 mL/min (ref >60)
Glucose, Bld: 308 mg/dL — ABNORMAL HIGH (ref 70–99)
Potassium: 3.7 mmol/L (ref 3.5–5.1)
Sodium: 130 mmol/L — ABNORMAL LOW (ref 135–145)
Total Bilirubin: 1.2 mg/dL (ref 0.3–1.2)
Total Protein: 7.6 g/dL (ref 6.5–8.1)

## 2021-11-16 LAB — CBC WITH DIFFERENTIAL/PLATELET
Abs Immature Granulocytes: 0.4 10*3/uL — ABNORMAL HIGH (ref 0.00–0.07)
Band Neutrophils: 3 %
Basophils Absolute: 0 10*3/uL (ref 0.0–0.1)
Basophils Relative: 0 %
Eosinophils Absolute: 0 10*3/uL (ref 0.0–0.5)
Eosinophils Relative: 0 %
HCT: 38.2 % — ABNORMAL LOW (ref 39.0–52.0)
Hemoglobin: 13.1 g/dL (ref 13.0–17.0)
Lymphocytes Relative: 5 %
Lymphs Abs: 2.1 10*3/uL (ref 0.7–4.0)
MCH: 30.8 pg (ref 26.0–34.0)
MCHC: 34.3 g/dL (ref 30.0–36.0)
MCV: 89.7 fL (ref 80.0–100.0)
Monocytes Absolute: 1.7 10*3/uL — ABNORMAL HIGH (ref 0.1–1.0)
Monocytes Relative: 4 %
Myelocytes: 1 %
Neutro Abs: 37.6 10*3/uL — ABNORMAL HIGH (ref 1.7–7.7)
Neutrophils Relative %: 87 %
Platelets: 274 10*3/uL (ref 150–400)
RBC: 4.26 MIL/uL (ref 4.22–5.81)
RDW: 14 % (ref 11.5–15.5)
WBC: 41.8 10*3/uL — ABNORMAL HIGH (ref 4.0–10.5)
nRBC: 0 % (ref 0.0–0.2)

## 2021-11-16 LAB — URINALYSIS, ROUTINE W REFLEX MICROSCOPIC
Bilirubin Urine: NEGATIVE
Glucose, UA: 250 mg/dL — AB
Leukocytes,Ua: NEGATIVE
Nitrite: NEGATIVE
Protein, ur: 30 mg/dL — AB
Specific Gravity, Urine: 1.016 (ref 1.005–1.030)
pH: 5.5 (ref 5.0–8.0)

## 2021-11-16 LAB — LACTIC ACID, PLASMA: Lactic Acid, Venous: 1.9 mmol/L (ref 0.5–1.9)

## 2021-11-16 LAB — PROTIME-INR
INR: 1 (ref 0.8–1.2)
Prothrombin Time: 13 seconds (ref 11.4–15.2)

## 2021-11-16 LAB — RESP PANEL BY RT-PCR (FLU A&B, COVID) ARPGX2
Influenza A by PCR: NEGATIVE
Influenza B by PCR: NEGATIVE
SARS Coronavirus 2 by RT PCR: NEGATIVE

## 2021-11-16 MED ORDER — SODIUM CHLORIDE 0.9 % IV SOLN
500.0000 mg | INTRAVENOUS | Status: DC
  Administered 2021-11-16 – 2021-11-18 (×3): 500 mg via INTRAVENOUS
  Filled 2021-11-16 (×3): qty 5

## 2021-11-16 MED ORDER — SODIUM CHLORIDE 0.9 % IV SOLN
2.0000 g | INTRAVENOUS | Status: DC
  Administered 2021-11-16 – 2021-11-18 (×3): 2 g via INTRAVENOUS
  Filled 2021-11-16 (×3): qty 20

## 2021-11-16 MED ORDER — LACTATED RINGERS IV SOLN: INTRAVENOUS | Status: DC

## 2021-11-16 MED ORDER — LACTATED RINGERS IV BOLUS (SEPSIS)
1000.0000 mL | Freq: Once | INTRAVENOUS | Status: AC
  Administered 2021-11-16: 1000 mL via INTRAVENOUS

## 2021-11-16 MED ORDER — INSULIN PUMP
SUBCUTANEOUS | Status: DC
  Filled 2021-11-16: qty 1

## 2021-11-16 MED ORDER — INSULIN ASPART 100 UNIT/ML IJ SOLN: 0.0000 [IU] | INTRAMUSCULAR | Status: DC

## 2021-11-16 MED ORDER — IOHEXOL 300 MG/ML  SOLN
100.0000 mL | Freq: Once | INTRAMUSCULAR | Status: AC | PRN
  Administered 2021-11-16: 75 mL via INTRAVENOUS

## 2021-11-16 MED ORDER — SODIUM CHLORIDE 0.9 % IV SOLN: INTRAVENOUS | Status: AC

## 2021-11-16 MED ORDER — ACETAMINOPHEN 650 MG RE SUPP: 650.0000 mg | Freq: Four times a day (QID) | RECTAL | Status: DC | PRN

## 2021-11-16 MED ORDER — ACETAMINOPHEN 650 MG RE SUPP
RECTAL | Status: AC
  Administered 2021-11-16: 650 mg
  Filled 2021-11-16: qty 1

## 2021-11-16 MED ORDER — ACETAMINOPHEN 325 MG PO TABS
650.0000 mg | ORAL_TABLET | Freq: Four times a day (QID) | ORAL | Status: DC | PRN
  Administered 2021-11-16 – 2021-11-17 (×3): 650 mg via ORAL
  Filled 2021-11-16 (×3): qty 2

## 2021-11-16 MED ORDER — ENOXAPARIN SODIUM 40 MG/0.4ML IJ SOSY
40.0000 mg | PREFILLED_SYRINGE | INTRAMUSCULAR | Status: DC
  Administered 2021-11-16 – 2021-11-17 (×2): 40 mg via SUBCUTANEOUS
  Filled 2021-11-16 (×2): qty 0.4

## 2021-11-16 NOTE — Progress Notes (Signed)
Pharmacy Note regarding patient's own insulin pump: ? ?     I interviewed patient and his wife this evening.  Patient showed me his Medtronic Minimed insulin pump which was in working order and the set was appropriately in place on his right lower abdomen.  Patient also wearing a Freestyle Libre Sensor on his left tricep.  Sensor glucose reading at the time of my interview was 135 and no bolus was administered (insulin pump basal rate infusing continuously).   ?     Patient and wife expressed desire for patient to continue using pump while in hospital and stated they were independently able to continue using the insulin pump along with having access to necessary pump supplies as needed.  Wife knowledgeable with patient's insulin pump and supports him with his glucose management.   ?     Informed patient and wife that metered glucose checks will be performed while patient hospitalized.  Patient and wife agreed to tell nursing if any insulin boluses are administered via insulin pump for documentation.  Gave patient's wife my contact number if she needs assistance. ? ?Royetta Asal, PharmD, BCPS ?Clinical Pharmacist ?[place] ?Please utilize Amion for appropriate phone number to reach the unit pharmacist ([place]) ?11/16/2021 10:10 PM ? ?

## 2021-11-16 NOTE — Assessment & Plan Note (Addendum)
Likely due to acute illness. ?-PT eval, fall precautions ?

## 2021-11-16 NOTE — Progress Notes (Signed)
Elink following code sepsis °

## 2021-11-16 NOTE — ED Provider Notes (Signed)
?  Physical Exam  ?BP (!) 157/62   Pulse 92   Temp (!) 104.3 ?F (40.2 ?C) (Rectal)   Resp 15   Ht 5\' 6"  (1.676 m)   Wt 63 kg   SpO2 96%   BMI 22.44 kg/m?  ? ? ? ?Procedures  ?Procedures ? ?ED Course / MDM  ? ?Clinical Course as of 11/16/21 1518  ?Mon Nov 16, 2021  ?1431 Patient's white count is markedly elevated.  I do not see any other prior values for him. [MB]  ?  ?Clinical Course User Index ?[MB] Hayden Rasmussen, MD  ? ?Medical Decision Making ?Amount and/or Complexity of Data Reviewed ?Labs: ordered. ?Radiology: ordered. ?ECG/medicine tests: ordered. ? ?Risk ?Prescription drug management. ?Decision regarding hospitalization. ? ? ?66 year old male presenting with sepsis and likely PNA. Febrile to 104.30F, tachycardic to 107, CXR with a LLL PNA. Needs admission following full workup. Got Rocephin and Azithromycin and 2L LR boluses.  ? ?Patient CT scan reveals an extensive left lower lobe pneumonia with small left pleural effusion.  The patient was admitted to the hospitalist service for sepsis in the setting of his pneumonia. ? ? ?  ?Regan Lemming, MD ?11/16/21 1705 ? ?

## 2021-11-16 NOTE — Plan of Care (Signed)
Plan of Care Note for accepted transfer ? ? ?Patient: Kyle Velazquez MRN: [id number]   DOA: 11/16/2021 ? ?Facility requesting transfer: [facility] ?Requesting Provider: Alyson Reedy, MD ?Reason for transfer: Right lower lobe pneumonia; Sepsis ?Facility course: LR 2 L bolus, Ceftriaxone, Azithromycin and Tylenol given. Imaging significant for extensive RLL pneumonia. Blood and urine cultures orders placed . ? ?Plan of care: ?The patient is accepted for admission to [place]  unit, at [facility]. ? ?Author: ?Cordelia Poche, MD ?11/16/2021 ? ?Check [email or web address] for on-call coverage. ? ?Nursing staff, Please call [place] number on Amion as soon as patient's arrival, so appropriate admitting provider can evaluate the pt. ? ?

## 2021-11-16 NOTE — ED Provider Notes (Signed)
[facility] EMERGENCY DEPT Provider Note   CSN: [id number] Arrival date & time: 11/16/21  1229     History  Chief Complaint  Patient presents with   Fever    Kyle Velazquez is a 66 y.o. male.  He has a history of diabetes.  Level 5 caveat secondary to altered mental status.  He is brought in by his wife after being sick for about 5 days.  Fevers and nonproductive cough.  Sleeping more.  Seems more confused over the last 2 days.  No vomiting or diarrhea.  Denies tobacco use.  Saw PCP and had a negative COVID swab told to continue to use Tylenol and ibuprofen for fever control.  The history is provided by the patient and the spouse.  Fever Max temp prior to arrival:  104 Temp source:  Rectal Severity:  Unable to specify Onset quality:  Gradual Duration:  5 days Timing:  Intermittent Progression:  Unchanged Chronicity:  New Relieved by:  Nothing Worsened by:  Nothing Ineffective treatments:  Acetaminophen and ibuprofen Associated symptoms: chest pain, cough, headaches and somnolence   Associated symptoms: no diarrhea, no dysuria, no nausea and no vomiting   Risk factors: no recent travel and no sick contacts       Home Medications Prior to Admission medications   Medication Sig Start Date End Date Taking? Authorizing Provider  ALPRAZolam Duanne Moron) 1 MG tablet TAKE 1/2 TABLET BY MOUTH FIVE TIMES DAILY 06/16/21   Cottle, Billey Co., MD  amLODipine (NORVASC) 5 MG tablet Take 1 tablet (5 mg total) by mouth daily. 11/10/21   Nahser, Wonda Cheng, MD  betamethasone dipropionate 0.05 % cream Apply 1 drop topically 2 (two) times daily. 10/30/19   [provider]  Continuous Blood Gluc Sensor (FREESTYLE LIBRE 14 DAY SENSOR) MISC by Does not apply route.    [provider]  glucose blood test strip 1 each by Other route as needed for other. Use as instructed    [provider]  hydrochlorothiazide (HYDRODIURIL) 25 MG tablet Take 1 tablet (25 mg total)  by mouth daily. Patient needs appointment for any future refills.  Please call office at [phone] to schedule appointment. 1st attempt. 09/16/21   Nahser, Wonda Cheng, MD  hydrocortisone 2.5 % cream Apply topically 2 (two) times daily as needed. Apply topically 2 (two) times daily as needed. 06/15/21   [provider]  hyoscyamine (LEVSIN SL) 0.125 MG SL tablet Place 1 tablet (0.125 mg total) under the tongue every 4 (four) hours as needed. 10/30/20   Nelida Meuse III, MD  insulin aspart (NOVOLOG) 100 UNIT/ML injection Inject into the skin 3 (three) times daily before meals. INJECT UP TO 70 UNITS DAILY ON A SLIDING SCALE    [provider]  lisinopril-hydrochlorothiazide (ZESTORETIC) 20-25 MG tablet Take 1 tablet by mouth daily. Patient not taking: Reported on 09/16/2021 05/07/21   [provider]  magnesium oxide (MAG-OX) 400 MG tablet 1 tablet as needed 08/05/21   [provider]  omeprazole (PRILOSEC) 40 MG capsule Take 40 mg by mouth daily. 12/15/19   [provider]  PERCOCET 10-325 MG tablet Take 1 tablet by mouth every 6 (six) hours as needed. AS NEEDED FOR PAIN 02/23/16   [provider]  potassium chloride SA (KLOR-CON M) 20 MEQ tablet Take 1 tablet (20 mEq total) by mouth daily. 09/16/21   Nahser, Wonda Cheng, MD  sertraline (ZOLOFT) 25 MG tablet TAKE 1 TABLET(25 MG) BY MOUTH DAILY 06/16/21  Cottle, Billey Co., MD  sildenafil (VIAGRA) 50 MG tablet 1 Tablet(s) By Mouth 08/10/21   [provider]  Testosterone 20.25 MG/ACT (1.62%) GEL SMARTSIG:3 Pump Topical Daily 12/24/19   [provider]  valACYclovir (VALTREX) 500 MG tablet Take 500 mg by mouth daily.    [provider]  valsartan (DIOVAN) 320 MG tablet Take 1 tablet (320 mg total) by mouth daily. 11/10/21   Nahser, Wonda Cheng, MD      Allergies    Patient has no known allergies.    Review of Systems   Review of Systems  Unable to perform ROS: Mental status change   Constitutional:  Positive for fever.  Respiratory:  Positive for cough.   Cardiovascular:  Positive for chest pain.  Gastrointestinal:  Negative for diarrhea, nausea and vomiting.  Genitourinary:  Negative for dysuria.  Neurological:  Positive for headaches.   Physical Exam Updated Vital Signs BP (!) 175/75 (BP Location: Right Arm)    Pulse (!) 107    Temp (!) 104.3 F (40.2 C) (Rectal)    Resp 14    Ht '5\' 6"'  (1.676 m)    Wt 63 kg    SpO2 94%    BMI 22.44 kg/m  Physical Exam Vitals and nursing note reviewed.  Constitutional:      General: He is not in acute distress.    Appearance: Normal appearance. He is well-developed.  HENT:     Head: Normocephalic and atraumatic.  Eyes:     Conjunctiva/sclera: Conjunctivae normal.  Cardiovascular:     Rate and Rhythm: Regular rhythm. Tachycardia present.     Pulses: Normal pulses.     Heart sounds: No murmur heard. Pulmonary:     Effort: Pulmonary effort is normal. No respiratory distress.     Breath sounds: Rhonchi present.  Abdominal:     Palpations: Abdomen is soft.     Tenderness: There is no abdominal tenderness. There is no guarding or rebound.  Musculoskeletal:        General: No swelling. Normal range of motion.     Cervical back: Neck supple.     Right lower leg: No edema.     Left lower leg: No edema.  Skin:    General: Skin is warm and dry.     Capillary Refill: Capillary refill takes less than 2 seconds.  Neurological:     General: No focal deficit present.     Mental Status: He is alert.     Sensory: No sensory deficit.     Motor: No weakness.     Comments: Patient difficulty get straight answers from him.  He is following all commands.    ED Results / Procedures / Treatments   Labs (all labs ordered are listed, but only abnormal results are displayed) Labs Reviewed  CBC WITH DIFFERENTIAL/PLATELET - Abnormal; Notable for the following components:      Result Value   WBC 41.8 (*)    HCT 38.2 (*)    Neutro Abs  37.6 (*)    Monocytes Absolute 1.7 (*)    Abs Immature Granulocytes 0.40 (*)    All other components within normal limits  URINALYSIS, ROUTINE W REFLEX MICROSCOPIC - Abnormal; Notable for the following components:   Glucose, UA 250 (*)    Hgb urine dipstick SMALL (*)    Ketones, ur TRACE (*)    Protein, ur 30 (*)    All other components within normal limits  COMPREHENSIVE METABOLIC PANEL - Abnormal; Notable  for the following components:   Sodium 130 (*)    Chloride 90 (*)    Glucose, Bld 308 (*)    BUN 35 (*)    Calcium 10.5 (*)    Alkaline Phosphatase 207 (*)    All other components within normal limits  RESP PANEL BY RT-PCR (FLU A&B, COVID) ARPGX2  CULTURE, BLOOD (ROUTINE X 2)  CULTURE, BLOOD (ROUTINE X 2)  URINE CULTURE  PROTIME-INR  LACTIC ACID, PLASMA    EKG EKG Interpretation  Date/Time:  Monday November 16 2021 14:36:01 EST Ventricular Rate:  97 PR Interval:  147 QRS Duration: 96 QT Interval:  325 QTC Calculation: 413 R Axis:   79 Text Interpretation: Sinus rhythm Baseline wander in lead(s) I No old tracing to compare Confirmed by Aletta Edouard [phone]) on 11/16/2021 2:39:17 PM  Radiology DG Chest 2 View  Result Date: 11/16/2021 CLINICAL DATA:  Fever and chills since Wednesday. EXAM: CHEST - 2 VIEW COMPARISON:  None. FINDINGS: The heart size and mediastinal contours are within normal limits. Normal pulmonary vascularity. Patchy left lower lobe consolidation. No pleural effusion or pneumothorax. No acute osseous abnormality. IMPRESSION: 1. Left lower lobe pneumonia. Followup PA and lateral chest X-ray is recommended in 3-4 weeks following trial of antibiotic therapy to ensure resolution. Electronically Signed   By: Titus Dubin M.D.   On: 11/16/2021 13:31   CT Head Wo Contrast  Result Date: 11/16/2021 CLINICAL DATA:  Mental status change. Unknown cause. Seizure and weakness. EXAM: CT HEAD WITHOUT CONTRAST TECHNIQUE: Contiguous axial images were obtained from the base  of the skull through the vertex without intravenous contrast. RADIATION DOSE REDUCTION: This exam was performed according to the departmental dose-optimization program which includes automated exposure control, adjustment of the mA and/or kV according to patient size and/or use of iterative reconstruction technique. COMPARISON:  CT head dated October 31, 2020 FINDINGS: Brain: No evidence of acute infarction, hemorrhage, hydrocephalus, extra-axial collection or mass lesion/mass effect. Vascular: No hyperdense vessel or unexpected calcification. Skull: Normal. Negative for fracture or focal lesion. Sinuses/Orbits: Mucosal thickening of the right maxillary sinus as well as patchy opacification of the ethmoid air cells, which of significantly improved from prior examination Other: None. IMPRESSION: 1.  No acute intracranial abnormality. 2. Mild paranasal sinus disease, which have improved since prior CT examination of October 31, 2020. Electronically Signed   By: Keane Police D.O.   On: 11/16/2021 15:38   CT Chest W Contrast  Result Date: 11/16/2021 CLINICAL DATA:  Pneumonia, complication suspected, xray done. Fever and weakness. EXAM: CT CHEST WITH CONTRAST TECHNIQUE: Multidetector CT imaging of the chest was performed during intravenous contrast administration. RADIATION DOSE REDUCTION: This exam was performed according to the departmental dose-optimization program which includes automated exposure control, adjustment of the mA and/or kV according to patient size and/or use of iterative reconstruction technique. CONTRAST:  28m OMNIPAQUE IOHEXOL 300 MG/ML  SOLN COMPARISON:  Chest radiographs 11/16/2021 FINDINGS: Cardiovascular: Normal caliber of the thoracic aorta. Normal heart size. No pericardial effusion. Mediastinum/Nodes: Borderline enlarged left hilar, subcarinal, and right paratracheal lymph nodes, likely reactive. Unremarkable esophagus and thyroid. Lungs/Pleura: Small left pleural effusion primarily  located in the superior aspect of the left major fissure. No pneumothorax. Extensive, dense consolidation in the left lower lobe. Clear right lung. Patent central airways. Upper Abdomen: No acute abnormality. Musculoskeletal: No suspicious osseous lesion. Mild thoracic spondylosis. IMPRESSION: Extensive left lower lobe pneumonia with small left pleural effusion. Electronically Signed   By: ASeymour BarsD.  On: 11/16/2021 15:40    Procedures .Critical Care Performed by: Hayden Rasmussen, MD Authorized by: Hayden Rasmussen, MD   Critical care provider statement:    Critical care time (minutes):  45   Critical care time was exclusive of:  Separately billable procedures and treating other patients   Critical care was necessary to treat or prevent imminent or life-threatening deterioration of the following conditions:  Respiratory failure and sepsis   Critical care was time spent personally by me on the following activities:  Development of treatment plan with patient or surrogate, discussions with consultants, evaluation of patient's response to treatment, examination of patient, obtaining history from patient or surrogate, ordering and performing treatments and interventions, ordering and review of laboratory studies, ordering and review of radiographic studies, pulse oximetry, re-evaluation of patient's condition and review of old charts   I assumed direction of critical care for this patient from another provider in my specialty: no      Medications Ordered in ED Medications  lactated ringers infusion ( Intravenous New Bag/Given 11/16/21 1428)  cefTRIAXone (ROCEPHIN) 2 g in sodium chloride 0.9 % 100 mL IVPB (0 g Intravenous Stopped 11/16/21 1459)  azithromycin (ZITHROMAX) 500 mg in sodium chloride 0.9 % 250 mL IVPB (0 mg Intravenous Stopped 11/16/21 1535)  acetaminophen (TYLENOL) 650 MG suppository (650 mg  Given 11/16/21 1419)  lactated ringers bolus 1,000 mL (0 mLs Intravenous Stopped 11/16/21 1455)     And  lactated ringers bolus 1,000 mL (1,000 mLs Intravenous New Bag/Given 11/16/21 1422)  iohexol (OMNIPAQUE) 300 MG/ML solution 100 mL (75 mLs Intravenous Contrast Given 11/16/21 1522)    ED Course/ Medical Decision Making/ A&P Clinical Course as of 11/16/21 1754  Mon Nov 16, 2021  1431 Patient's white count is markedly elevated.  I do not see any other prior values for him. [MB]    Clinical Course User Index [MB] Hayden Rasmussen, MD                           Medical Decision Making Amount and/or Complexity of Data Reviewed Labs: ordered. Radiology: ordered. ECG/medicine tests: ordered.  Risk Prescription drug management. Decision regarding hospitalization.  Kyle Velazquez was evaluated in Emergency Department on 11/16/2021 for the symptoms described in the history of present illness. He was evaluated in the context of the global COVID-19 pandemic, which necessitated consideration that the patient might be at risk for infection with the SARS-CoV-2 virus that causes COVID-19. Institutional protocols and algorithms that pertain to the evaluation of patients at risk for COVID-19 are in a state of rapid change based on information released by regulatory bodies including the CDC and federal and state organizations. These policies and algorithms were followed during the patient's care in the ED.  This patient complains of cough weakness fever; this involves an extensive number of treatment Options and is a complaint that carries with it a high risk of complications and morbidity. The differential includes sepsis, Sirs, pneumonia, COVID, flu, metabolic derangement  I ordered, reviewed and interpreted labs, which included CBC with markedly elevated white count, hemoglobin stable, chemistries with low sodium low bicarb elevated glucose, renal functions normal other than elevated alk phos no priors to compare with, urinalysis without clear signs of infection, lactate not elevated, COVID and flu  negative, blood culture sent I ordered medication oral Tylenol, IV fluids and IV antibiotic's and reviewed PMP when indicated. I ordered imaging studies which included chest x-ray head  CT and CT chest and I independently    visualized and interpreted imaging which showed large left-sided pneumonia Additional history obtained from patient's wife Previous records obtained and reviewed in epic no recent admissions Cardiac monitoring reviewed, patient in sinus tachycardia Social determinants considered, no significant barriers Critical Interventions: Initiation of IV fluids and IV antibiotics for sepsis  After the interventions stated above, I reevaluated the patient and found patient to be hemodynamically improved. Admission and further testing considered, he will need admission to the hospital for further management of his sepsis and pneumonia.  Patient's care signed out to oncoming provider Dr. Armandina Gemma to follow-up on results of head CT and chest CT and discussion with hospitalist.          Final Clinical Impression(s) / ED Diagnoses Final diagnoses:  Sepsis, due to unspecified organism, unspecified whether acute organ dysfunction present [facility])  Community acquired pneumonia of left lung, unspecified part of lung  Hyponatremia    Rx / DC Orders ED Discharge Orders     None         Hayden Rasmussen, MD 11/16/21 1757

## 2021-11-16 NOTE — ED Triage Notes (Signed)
Pt arrives to ED with c/o fever, weakness. This started x5 days ago. Pt reports that his fevers have been intermittent with the highest Temp was 102.5. He reports new onset weakness and fatigue, he wants to sleep all day, and is having harder time walking. Associated symptoms include chills, cough.  ?

## 2021-11-16 NOTE — Assessment & Plan Note (Addendum)
Sepsis ?-Patient presented with fever, tachycardia, leukocytosis ?-No lactic acidosis or hypotension ?-CT chest showing extensive left lower lobe pneumonia with small left pleural effusion.   ?-Sepsis physiology has resolved ?-Started on ceftriaxone and Zithromax ?-Follow blood culture results ?

## 2021-11-16 NOTE — Assessment & Plan Note (Addendum)
-  Alk phos minimally elevated to207 ?-GGT is 98; confirming source of alk phos from liver ?-No GI symptoms ?-Follow alk phos in a.m. ?

## 2021-11-16 NOTE — Assessment & Plan Note (Addendum)
-   Patient takes Xanax 0.5 up to 5 times a day ?-We will start Xanax 0.5 mg 3 times daily as needed ?-Restart sertraline ?

## 2021-11-16 NOTE — H&P (Addendum)
History and Physical    Kyle Velazquez NWG:[id number] DOB: 05/20/1956 DOA: 11/16/2021  DOS: the patient was seen and examined on 11/16/2021  PCP: Reynold Bowen, MD   Patient coming from: Home  I have personally briefly reviewed patient's old medical records in [place]  Patient is a 66 year old male with a past medical history of hypertension, hyperlipidemia, anxiety, celiac disease, chronic pain, type I diabetes presented to the ED for evaluation of fevers, cough, confusion, and generalized weakness.  In the ED, patient was febrile and tachycardic.  Labs showing WBC 57.8 with neutrophilic predominance.  Sodium 130.  Chloride 90.  Blood glucose 308.  Bicarb 27, anion gap 13.  BUN 35, creatinine 0.8.  Alk phos 207, remainder of LFTs normal.  COVID and influenza PCR negative.  Lactic acid normal.  Blood cultures drawn.  UA without signs of infection.  Urine culture pending.  CT chest showing extensive left lower lobe pneumonia with small left pleural effusion.  CT head negative for acute finding. Patient was given ceftriaxone, azithromycin, Tylenol, and 2 L fluid boluses.  History provided by patient and his wife at bedside.  About 5 days ago he started having fevers, runny nose, and cough.  He was seen by his PCP at that time who did a COVID test which was negative and he was told that his symptoms were due to an upper respiratory tract infection.  He is coughing a little.  Did not have much shortness of breath until today.  No chest pain.  He is having generalized weakness and poor appetite.  Per wife, slightly confused since yesterday.  Patient reports history of chronic abdominal pain due to celiac disease, no change from baseline.  No vomiting or diarrhea.    Review of Systems:  Review of Systems  All other systems reviewed and are negative.  Past Medical History:  Diagnosis Date   Celiac disease    Chronic back pain    Chronic shoulder pain    Diabetes 1.5, managed as type 2 ([place])     Diabetic neuropathy ([place])    Hyperlipidemia    Hypertension    Internal hemorrhoids     Past Surgical History:  Procedure Laterality Date   COLONOSCOPY  2007 2016   Dr Earlean Shawl    ESOPHAGOGASTRODUODENOSCOPY  2011   Dr Earlean Shawl    NECK SURGERY     SHOULDER SURGERY  2009     reports that he has quit smoking. He has never used smokeless tobacco. He reports that he does not drink alcohol and does not use drugs.  No Known Allergies  Family History  Problem Relation Age of Onset   CAD Mother    CAD Father     Prior to Admission medications   Medication Sig Start Date End Date Taking? Authorizing Provider  ALPRAZolam Duanne Moron) 1 MG tablet TAKE 1/2 TABLET BY MOUTH FIVE TIMES DAILY 06/16/21   Cottle, Billey Co., MD  amLODipine (NORVASC) 5 MG tablet Take 1 tablet (5 mg total) by mouth daily. 11/10/21   Nahser, Wonda Cheng, MD  betamethasone dipropionate 0.05 % cream Apply 1 drop topically 2 (two) times daily. 10/30/19   [provider]  Continuous Blood Gluc Sensor (FREESTYLE LIBRE 14 DAY SENSOR) MISC by Does not apply route.    [provider]  glucose blood test strip 1 each by Other route as needed for other. Use as instructed    [provider]  hydrochlorothiazide (HYDRODIURIL) 25 MG tablet Take 1  tablet (25 mg total) by mouth daily. Patient needs appointment for any future refills.  Please call office at [phone] to schedule appointment. 1st attempt. 09/16/21   Nahser, Wonda Cheng, MD  hydrocortisone 2.5 % cream Apply topically 2 (two) times daily as needed. Apply topically 2 (two) times daily as needed. 06/15/21   [provider]  hyoscyamine (LEVSIN SL) 0.125 MG SL tablet Place 1 tablet (0.125 mg total) under the tongue every 4 (four) hours as needed. 10/30/20   Nelida Meuse III, MD  insulin aspart (NOVOLOG) 100 UNIT/ML injection Inject into the skin 3 (three) times daily before meals. INJECT UP TO 70 UNITS DAILY ON A SLIDING SCALE    [provider]  lisinopril-hydrochlorothiazide (ZESTORETIC) 20-25 MG tablet Take 1 tablet by mouth daily. Patient not taking: Reported on 09/16/2021 05/07/21   [provider]  magnesium oxide (MAG-OX) 400 MG tablet 1 tablet as needed 08/05/21   [provider]  omeprazole (PRILOSEC) 40 MG capsule Take 40 mg by mouth daily. 12/15/19   [provider]  PERCOCET 10-325 MG tablet Take 1 tablet by mouth every 6 (six) hours as needed. AS NEEDED FOR PAIN 02/23/16   [provider]  potassium chloride SA (KLOR-CON M) 20 MEQ tablet Take 1 tablet (20 mEq total) by mouth daily. 09/16/21   Nahser, Wonda Cheng, MD  sertraline (ZOLOFT) 25 MG tablet TAKE 1 TABLET(25 MG) BY MOUTH DAILY 06/16/21   Cottle, Billey Co., MD  sildenafil (VIAGRA) 50 MG tablet 1 Tablet(s) By Mouth 08/10/21   [provider]  Testosterone 20.25 MG/ACT (1.62%) GEL SMARTSIG:3 Pump Topical Daily 12/24/19   [provider]  valACYclovir (VALTREX) 500 MG tablet Take 500 mg by mouth daily.    [provider]  valsartan (DIOVAN) 320 MG tablet Take 1 tablet (320 mg total) by mouth daily. 11/10/21   Nahser, Wonda Cheng, MD    Physical Exam: Vitals:   11/16/21 1625 11/16/21 1822 11/16/21 1900 11/16/21 1924  BP: (!) 148/60 137/61  (!) 128/59  Pulse: 86 (!) 139 69 69  Resp: '14 17  14  ' Temp: 100.1 F (37.8 C) (!) 101.2 F (38.4 C)  98.5 F (36.9 C)  TempSrc:  Oral  Oral  SpO2: 93% 94%  96%  Weight:      Height:        Physical Exam Constitutional:      General: He is not in acute distress. HENT:     Head: Normocephalic and atraumatic.  Eyes:     Extraocular Movements: Extraocular movements intact.     Conjunctiva/sclera: Conjunctivae normal.  Cardiovascular:     Rate and Rhythm: Normal rate and regular rhythm.     Pulses: Normal pulses.  Pulmonary:     Effort: Pulmonary effort is normal. No respiratory distress.     Breath sounds: No wheezing or rales.     Comments: Decreased  breath sounds at the left lung base Abdominal:     General: Bowel sounds are normal. There is no distension.     Palpations: Abdomen is soft.     Tenderness: There is no abdominal tenderness.  Musculoskeletal:        General: No swelling or tenderness.     Cervical back: Normal range of motion and neck supple.  Skin:    General: Skin is warm and dry.  Neurological:     General: No focal deficit present.     Mental Status: He is alert.  Comments: AAOx4 but slightly slow to respond to questions     Labs on Admission: I have personally reviewed following labs and imaging studies  CBC: Recent Labs  Lab 11/16/21 1300  WBC 41.8*  NEUTROABS 37.6*  HGB 13.1  HCT 38.2*  MCV 89.7  PLT 375   Basic Metabolic Panel: Recent Labs  Lab 11/16/21 1400  NA 130*  K 3.7  CL 90*  CO2 27  GLUCOSE 308*  BUN 35*  CREATININE 0.80  CALCIUM 10.5*   GFR: Estimated Creatinine Clearance: 80 mL/min (by C-G formula based on SCr of 0.8 mg/dL). Liver Function Tests: Recent Labs  Lab 11/16/21 1400  AST 32  ALT 36  ALKPHOS 207*  BILITOT 1.2  PROT 7.6  ALBUMIN 3.5   No results for input(s): LIPASE, AMYLASE in the last 168 hours. No results for input(s): AMMONIA in the last 168 hours. Coagulation Profile: Recent Labs  Lab 11/16/21 1300  INR 1.0   Cardiac Enzymes: No results for input(s): CKTOTAL, CKMB, CKMBINDEX, TROPONINI in the last 168 hours. BNP (last 3 results) No results for input(s): PROBNP in the last 8760 hours. HbA1C: No results for input(s): HGBA1C in the last 72 hours. CBG: No results for input(s): GLUCAP in the last 168 hours. Lipid Profile: No results for input(s): CHOL, HDL, LDLCALC, TRIG, CHOLHDL, LDLDIRECT in the last 72 hours. Thyroid Function Tests: No results for input(s): TSH, T4TOTAL, FREET4, T3FREE, THYROIDAB in the last 72 hours. Anemia Panel: No results for input(s): VITAMINB12, FOLATE, FERRITIN, TIBC, IRON, RETICCTPCT in the last 72 hours. Urine  analysis:    Component Value Date/Time   COLORURINE YELLOW 11/16/2021 [place] 11/16/2021 1445   LABSPEC 1.016 11/16/2021 1445   PHURINE 5.5 11/16/2021 1445   GLUCOSEU 250 (A) 11/16/2021 1445   HGBUR SMALL (A) 11/16/2021 1445   BILIRUBINUR NEGATIVE 11/16/2021 1445   KETONESUR TRACE (A) 11/16/2021 1445   PROTEINUR 30 (A) 11/16/2021 1445   NITRITE NEGATIVE 11/16/2021 1445   LEUKOCYTESUR NEGATIVE 11/16/2021 1445    Radiological Exams on Admission: I have personally reviewed images DG Chest 2 View  Result Date: 11/16/2021 CLINICAL DATA:  Fever and chills since Wednesday. EXAM: CHEST - 2 VIEW COMPARISON:  None. FINDINGS: The heart size and mediastinal contours are within normal limits. Normal pulmonary vascularity. Patchy left lower lobe consolidation. No pleural effusion or pneumothorax. No acute osseous abnormality. IMPRESSION: 1. Left lower lobe pneumonia. Followup PA and lateral chest X-ray is recommended in 3-4 weeks following trial of antibiotic therapy to ensure resolution. Electronically Signed   By: Titus Dubin M.D.   On: 11/16/2021 13:31   CT Head Wo Contrast  Result Date: 11/16/2021 CLINICAL DATA:  Mental status change. Unknown cause. Seizure and weakness. EXAM: CT HEAD WITHOUT CONTRAST TECHNIQUE: Contiguous axial images were obtained from the base of the skull through the vertex without intravenous contrast. RADIATION DOSE REDUCTION: This exam was performed according to the departmental dose-optimization program which includes automated exposure control, adjustment of the mA and/or kV according to patient size and/or use of iterative reconstruction technique. COMPARISON:  CT head dated October 31, 2020 FINDINGS: Brain: No evidence of acute infarction, hemorrhage, hydrocephalus, extra-axial collection or mass lesion/mass effect. Vascular: No hyperdense vessel or unexpected calcification. Skull: Normal. Negative for fracture or focal lesion. Sinuses/Orbits: Mucosal  thickening of the right maxillary sinus as well as patchy opacification of the ethmoid air cells, which of significantly improved from prior examination Other: None. IMPRESSION: 1.  No acute intracranial  abnormality. 2. Mild paranasal sinus disease, which have improved since prior CT examination of October 31, 2020. Electronically Signed   By: Keane Police D.O.   On: 11/16/2021 15:38   CT Chest W Contrast  Result Date: 11/16/2021 CLINICAL DATA:  Pneumonia, complication suspected, xray done. Fever and weakness. EXAM: CT CHEST WITH CONTRAST TECHNIQUE: Multidetector CT imaging of the chest was performed during intravenous contrast administration. RADIATION DOSE REDUCTION: This exam was performed according to the departmental dose-optimization program which includes automated exposure control, adjustment of the mA and/or kV according to patient size and/or use of iterative reconstruction technique. CONTRAST:  48m OMNIPAQUE IOHEXOL 300 MG/ML  SOLN COMPARISON:  Chest radiographs 11/16/2021 FINDINGS: Cardiovascular: Normal caliber of the thoracic aorta. Normal heart size. No pericardial effusion. Mediastinum/Nodes: Borderline enlarged left hilar, subcarinal, and right paratracheal lymph nodes, likely reactive. Unremarkable esophagus and thyroid. Lungs/Pleura: Small left pleural effusion primarily located in the superior aspect of the left major fissure. No pneumothorax. Extensive, dense consolidation in the left lower lobe. Clear right lung. Patent central airways. Upper Abdomen: No acute abnormality. Musculoskeletal: No suspicious osseous lesion. Mild thoracic spondylosis. IMPRESSION: Extensive left lower lobe pneumonia with small left pleural effusion. Electronically Signed   By: ALogan BoresM.D.   On: 11/16/2021 15:40    EKG: I have personally reviewed EKG: Sinus rhythm, rate increased since prior tracing.  Assessment/Plan Principal Problem:   CAP (community acquired pneumonia) Active Problems:   Acute  metabolic encephalopathy   Type 1 diabetes (HCC)   Dehydration   HTN (hypertension)   GAD (generalized anxiety disorder)   Hyponatremia   Hypochloremia   Elevated alkaline phosphatase level   Physical deconditioning    Assessment and Plan: * CAP (community acquired pneumonia) Sepsis CT chest showing extensive left lower lobe pneumonia with small left pleural effusion.  Met criteria for severe sepsis with fever, tachycardia, significant leukocytosis (WBC 474.9with neutrophilic predominance).  No lactic acidosis or hypotension.  COVID and influenza PCR negative.  Patient was placed on 2 L supplemental oxygen in the ED, unclear whether for comfort or if he was hypoxic. -Continue ceftriaxone and azithromycin.  Tylenol as needed for fevers.  Tachycardia has resolved after 2 L IV fluid boluses, continue maintenance IV fluid.  Strep pneumo and Legionella urinary antigens.  Blood cultures pending.  Continue supplemental oxygen to keep oxygen saturation above 92%, wean as tolerated.  Monitor WBC count.  Addendum 11/16/2021 at 10:16 PM: Meets criteria for sepsis  Acute metabolic encephalopathy Likely due to acute illness.  CT head negative for acute finding and no focal neurodeficit on exam.  Patient is currently AAO x4 but slightly slow to respond to questions. -Continue to monitor  Dehydration BUN elevated. -IV fluid hydration  Type 1 diabetes ([place] He has an insulin pump.  Blood glucose 308 on initial labs without signs of DKA.  Most recent blood glucose 135 on CGM.  Pharmacy spoke to the patient and his wife, they are interested in continuing using his insulin pump while he is in the hospital. -Check A1c.  Continue insulin pump.  CBG checks every 4 hours.  Elevated alkaline phosphatase level -Check GGT level  Hypochloremia Likely due to home diuretic use. -IV fluid hydration, continue to monitor.  Hold diuretic at this time.  Hyponatremia Mild, corrected sodium 133. -IV fluid  hydration, continue to monitor  GAD (generalized anxiety disorder) -Resume home meds after pharmacy med rec is done.  HTN (hypertension) Stable. -Hold home antihypertensives at this time given  concern for sepsis.  Physical deconditioning Likely due to acute illness. -PT eval, fall precautions   DVT prophylaxis: Lovenox Code Status: Full Code (discussed with the patient) Family Communication: Wife at bedside. Admission status: Inpatient, Med-Surg   Shela Leff, MD [facility] 11/16/2021, 10:17 PM

## 2021-11-16 NOTE — Assessment & Plan Note (Addendum)
-   Likely from SIADH from HCTZ ?-Check serum and urine osmolality ?-Follow serum sodium in a.m. ?

## 2021-11-16 NOTE — Assessment & Plan Note (Addendum)
Likely due to home diuretic use. ?-IV fluid hydration, continue to monitor.  Hold diuretic at this time. ?

## 2021-11-16 NOTE — Assessment & Plan Note (Signed)
BUN elevated. ?-IV fluid hydration ?

## 2021-11-16 NOTE — Subjective & Objective (Addendum)
Patient is a 66 year old male with a past medical history of hypertension, hyperlipidemia, anxiety, celiac disease, chronic pain, type I diabetes presented to the ED for evaluation of fevers, cough, confusion, and generalized weakness.  In the ED, patient was febrile and tachycardic.  Labs showing WBC 29.1 with neutrophilic predominance.  Sodium 130.  Chloride 90.  Blood glucose 308.  Bicarb 27, anion gap 13.  BUN 35, creatinine 0.8.  Alk phos 207, remainder of LFTs normal.  COVID and influenza PCR negative.  Lactic acid normal.  Blood cultures drawn.  UA without signs of infection.  Urine culture pending.  CT chest showing extensive left lower lobe pneumonia with small left pleural effusion.  CT head negative for acute finding. Patient was given ceftriaxone, azithromycin, Tylenol, and 2 L fluid boluses. ? ?History provided by patient and his wife at bedside.  About 5 days ago he started having fevers, runny nose, and cough.  He was seen by his PCP at that time who did a COVID test which was negative and he was told that his symptoms were due to an upper respiratory tract infection.  He is coughing a little.  Did not have much shortness of breath until today.  No chest pain.  He is having generalized weakness and poor appetite.  Per wife, slightly confused since yesterday.  Patient reports history of chronic abdominal pain due to celiac disease, no change from baseline.  No vomiting or diarrhea. ? ? ?

## 2021-11-16 NOTE — Assessment & Plan Note (Addendum)
Likely due to acute illness.  CT head negative for acute finding and no focal neurodeficit on exam.   ?-Resolved, patient is currently AAO x4 ?-Continue to monitor ?

## 2021-11-16 NOTE — Assessment & Plan Note (Addendum)
-  He has an insulin pump.  ?-Patient and wife wanted to continue insulin pump ?-Hemoglobin A1c 6.8 ?-CBG checks every 4 hours. ?

## 2021-11-16 NOTE — Assessment & Plan Note (Addendum)
Stable. ?-We will restart ARB, continue to hold HCTZ due to hyponatremia ?-Restart amlodipine 5 mg daily ?

## 2021-11-17 ENCOUNTER — Ambulatory Visit (HOSPITAL_COMMUNITY): Payer: Medicare Other

## 2021-11-17 ENCOUNTER — Other Ambulatory Visit: Payer: Medicare Other

## 2021-11-17 DIAGNOSIS — A419 Sepsis, unspecified organism: Principal | ICD-10-CM

## 2021-11-17 DIAGNOSIS — E860 Dehydration: Secondary | ICD-10-CM

## 2021-11-17 DIAGNOSIS — E871 Hypo-osmolality and hyponatremia: Secondary | ICD-10-CM

## 2021-11-17 DIAGNOSIS — G9341 Metabolic encephalopathy: Secondary | ICD-10-CM

## 2021-11-17 DIAGNOSIS — F411 Generalized anxiety disorder: Secondary | ICD-10-CM

## 2021-11-17 DIAGNOSIS — I10 Essential (primary) hypertension: Secondary | ICD-10-CM

## 2021-11-17 DIAGNOSIS — R748 Abnormal levels of other serum enzymes: Secondary | ICD-10-CM

## 2021-11-17 DIAGNOSIS — R5381 Other malaise: Secondary | ICD-10-CM

## 2021-11-17 LAB — BASIC METABOLIC PANEL
Anion gap: 9 (ref 5–15)
BUN: 19 mg/dL (ref 8–23)
CO2: 26 mmol/L (ref 22–32)
Calcium: 8.8 mg/dL — ABNORMAL LOW (ref 8.9–10.3)
Chloride: 95 mmol/L — ABNORMAL LOW (ref 98–111)
Creatinine, Ser: 0.66 mg/dL (ref 0.61–1.24)
GFR, Estimated: >60 mL/min (ref >60)
Glucose, Bld: 198 mg/dL — ABNORMAL HIGH (ref 70–99)
Potassium: 3.4 mmol/L — ABNORMAL LOW (ref 3.5–5.1)
Sodium: 130 mmol/L — ABNORMAL LOW (ref 135–145)

## 2021-11-17 LAB — GAMMA GT: GGT: 98 U/L — ABNORMAL HIGH (ref 7–50)

## 2021-11-17 LAB — HEMOGLOBIN A1C
Hgb A1c MFr Bld: 6.8 % — ABNORMAL HIGH (ref 4.8–5.6)
Mean Plasma Glucose: 148.46 mg/dL

## 2021-11-17 LAB — CBC
HCT: 33.1 % — ABNORMAL LOW (ref 39.0–52.0)
Hemoglobin: 11.1 g/dL — ABNORMAL LOW (ref 13.0–17.0)
MCH: 31 pg (ref 26.0–34.0)
MCHC: 33.5 g/dL (ref 30.0–36.0)
MCV: 92.5 fL (ref 80.0–100.0)
Platelets: 289 10*3/uL (ref 150–400)
RBC: 3.58 MIL/uL — ABNORMAL LOW (ref 4.22–5.81)
RDW: 13.8 % (ref 11.5–15.5)
WBC: 42.6 10*3/uL — ABNORMAL HIGH (ref 4.0–10.5)
nRBC: 0 % (ref 0.0–0.2)

## 2021-11-17 LAB — OSMOLALITY, URINE: Osmolality, Ur: 540 mOsm/kg (ref 300–900)

## 2021-11-17 LAB — HIV ANTIBODY (ROUTINE TESTING W REFLEX): HIV Screen 4th Generation wRfx: NONREACTIVE

## 2021-11-17 LAB — GLUCOSE, CAPILLARY
Glucose-Capillary: 196 mg/dL — ABNORMAL HIGH (ref 70–99)
Glucose-Capillary: 198 mg/dL — ABNORMAL HIGH (ref 70–99)
Glucose-Capillary: 168 mg/dL — ABNORMAL HIGH (ref 70–99)
Glucose-Capillary: 170 mg/dL — ABNORMAL HIGH (ref 70–99)
Glucose-Capillary: 222 mg/dL — ABNORMAL HIGH (ref 70–99)
Glucose-Capillary: 272 mg/dL — ABNORMAL HIGH (ref 70–99)

## 2021-11-17 LAB — OSMOLALITY: Osmolality: 284 mOsm/kg (ref 275–295)

## 2021-11-17 LAB — STREP PNEUMONIAE URINARY ANTIGEN: Strep Pneumo Urinary Antigen: NEGATIVE

## 2021-11-17 MED ORDER — IRBESARTAN 300 MG PO TABS: 300.0000 mg | ORAL_TABLET | Freq: Every day | ORAL | Status: DC

## 2021-11-17 MED ORDER — SERTRALINE HCL 25 MG PO TABS
25.0000 mg | ORAL_TABLET | Freq: Every day | ORAL | Status: DC
  Administered 2021-11-18: 25 mg via ORAL
  Filled 2021-11-17: qty 1

## 2021-11-17 MED ORDER — IRBESARTAN 300 MG PO TABS
300.0000 mg | ORAL_TABLET | Freq: Every day | ORAL | Status: DC
  Administered 2021-11-18: 300 mg via ORAL
  Filled 2021-11-17: qty 1

## 2021-11-17 MED ORDER — HYDROCORTISONE ACETATE 25 MG RE SUPP
25.0000 mg | Freq: Two times a day (BID) | RECTAL | Status: DC | PRN
  Filled 2021-11-17: qty 1

## 2021-11-17 MED ORDER — SERTRALINE HCL 25 MG PO TABS
25.0000 mg | ORAL_TABLET | Freq: Every day | ORAL | Status: DC
Start: 2021-11-17 — End: 2021-11-17

## 2021-11-17 MED ORDER — POTASSIUM CHLORIDE CRYS ER 20 MEQ PO TBCR: 40.0000 meq | EXTENDED_RELEASE_TABLET | Freq: Once | ORAL | Status: DC

## 2021-11-17 MED ORDER — ALPRAZOLAM 0.5 MG PO TABS
0.5000 mg | ORAL_TABLET | Freq: Three times a day (TID) | ORAL | Status: DC | PRN
  Filled 2021-11-17: qty 1

## 2021-11-17 NOTE — Evaluation (Signed)
Physical Therapy Evaluation Only ?Patient Details ?Name: Kyle Velazquez ?MRN: [id number] ?DOB: [birth year] ?Today's Date: 11/17/2021 ? ?History of Present Illness ? Kyle Velazquez is a 66 y.o. male presents with fevers, cough, confusion, and generalized weakness. CT chest showing extensive left lower lobe pneumonia with small left pleural effusion.  CT head negative for acute finding.  PMH: type 1 diabetes, HTN, anxiety, celiac disease, chronic pain ?  ?Clinical Impression ? Pt from home, ind at baseline, lives with spouse, retired but helps out at [employer] for fun, no DME at home. Pt ambulates ind, good steadiness without LOB, on RA with SpO2 94-96% and no dyspnea noted. Educated pt on time OOB, ambulating multiple times daily with family/nursing as able, and pt verbalizes understanding. No acute PT needs identified, will sign off at this time. ?   ? ?Recommendations for follow up therapy are one component of a multi-disciplinary discharge planning process, led by the attending physician.  Recommendations may be updated based on patient status, additional functional criteria and insurance authorization. ? ?Follow Up Recommendations No PT follow up ? ?  ?Assistance Recommended at Discharge None  ?Patient can return home with the following ?   ? ?  ?Equipment Recommendations None recommended by PT  ?Recommendations for Other Services ?    ?  ?Functional Status Assessment    ? ?  ?Precautions / Restrictions Precautions ?Precautions: None ?Restrictions ?Weight Bearing Restrictions: No  ? ?  ? ?Mobility ? Bed Mobility ?Overal bed mobility: Independent ?   ? ?Transfers ?Overall transfer level: Independent ?Equipment used: None ?   ? ?Ambulation/Gait ?Ambulation/Gait assistance: Independent ?Gait Distance (Feet): 400 Feet ?Assistive device: None ?Gait Pattern/deviations: WFL(Within Functional Limits) ?Gait velocity: WFL ? General Gait Details: gait WFL, step through pattern, able to completes direction changes,  speed changes, head turns, backwards steps without LOB or unsteadiness noted; no dyspnea with SpO2 94-96% on RA ? ?Stairs ?  ?  ?  ?  ?  ? ?Wheelchair Mobility ?  ? ?Modified Rankin (Stroke Patients Only) ?  ? ?  ? ?Balance Overall balance assessment: No apparent balance deficits (not formally assessed) ?    ? ? ? ?Pertinent Vitals/Pain Pain Assessment ?Pain Assessment: No/denies pain  ? ? ?Home Living Family/patient expects to be discharged to:: Private residence ?Living Arrangements: Spouse/significant other ?Available Help at Discharge: Family;Available 24 hours/day ?Type of Home: House ?Home Access: Stairs to enter ?  ?  ?Alternate Level Stairs-Number of Steps: flight ?Home Layout: Two level ?Home Equipment: None ?   ?  ?Prior Function Prior Level of Function : Independent/Modified Independent;Driving ? Mobility Comments: pt reports ind at baseline, retired, helps out at [employer] tables for fun ?ADLs Comments: pt reports ind ?  ? ? ?Hand Dominance  ?   ? ?  ?Extremity/Trunk Assessment  ? Upper Extremity Assessment ?Upper Extremity Assessment: Overall WFL for tasks assessed ?  ? ?Lower Extremity Assessment ?Lower Extremity Assessment: Overall WFL for tasks assessed ?  ? ?Cervical / Trunk Assessment ?Cervical / Trunk Assessment: Normal  ?Communication  ? Communication: No difficulties  ?Cognition Arousal/Alertness: Awake/alert ?Behavior During Therapy: [facility] for tasks assessed/performed ?Overall Cognitive Status: Within Functional Limits for tasks assessed ?   ? ?  ?General Comments   ? ?  ?Exercises    ? ?Assessment/Plan  ?  ?PT Assessment Patient does not need any further PT services  ?PT Problem List   ? ?   ?  ?PT Treatment Interventions     ? ?  PT Goals (Current goals can be found in the Care Plan section)  ?Acute Rehab PT Goals ?Patient Stated Goal: "let's take a walk" ?PT Goal Formulation: All assessment and education complete, DC therapy ? ?  ?Frequency   ?  ? ? ?Co-evaluation   ?  ?  ?   ?  ? ? ?  ?AM-PAC PT "6 Clicks" Mobility  ?Outcome Measure Help needed turning from your back to your side while in a flat bed without using bedrails?: None ?Help needed moving from lying on your back to sitting on the side of a flat bed without using bedrails?: None ?Help needed moving to and from a bed to a chair (including a wheelchair)?: None ?Help needed standing up from a chair using your arms (e.g., wheelchair or bedside chair)?: None ?Help needed to walk in hospital room?: None ?Help needed climbing 3-5 steps with a railing? : None ?6 Click Score: 24 ? ?  ?End of Session   ?Activity Tolerance: Patient tolerated treatment well ?Patient left: in chair;with call bell/phone within reach;with family/visitor present ?Nurse Communication: Mobility status ?PT Visit Diagnosis: Other abnormalities of gait and mobility (R26.89) ?  ? ?Time: [id number] ?PT Time Calculation (min) (ACUTE ONLY): 25 min ? ? ?Charges:   PT Evaluation ?$PT Eval Low Complexity: 1 Low ?PT Treatments ?$Gait Training: 8-22 mins ?  ?   ? ? ? ?Talbot Grumbling PT, DPT ?11/17/21, 10:39 AM ? ? ?

## 2021-11-17 NOTE — Progress Notes (Signed)
Inpatient Diabetes Program Recommendations ? ?AACE/ADA: New Consensus Statement on Inpatient Glycemic Control (2015) ? ?Target Ranges:  Prepandial:   less than 140 mg/dL ?     Peak postprandial:   less than 180 mg/dL (1-2 hours) ?     Critically ill patients:  140 - 180 mg/dL  ? ?Lab Results  ?Component Value Date  ? GLUCAP 170 (H) 11/17/2021  ? HGBA1C 6.8 (H) 11/17/2021  ? ? ?Review of Glycemic Control ? Latest Reference Range & Units 11/17/21 07:35 11/17/21 11:42  ?Glucose-Capillary 70 - 99 mg/dL 160 (H) 737 (H)  ?(H): Data is abnormally high ? ?Diabetes history: DM1 ?Outpatient Diabetes medications:  ?Medtronic insulin pump with Freestyle Libre 14 day ?Basal; 1.5 units/hr (24H total basal 36.6 units) ?Insulin carb ratio: 1 units for every 10 CHOs  ?Insulin sensitivity factor: 1 units brings him down 60 points ?Target-120 mg/dL ?Active insulin time: 2 hrs ? ?Current orders for Inpatient glycemic control: Insulin pump therapy ? ?Spoke with patient and spouse at bedside.  Reviewed current A1C of 6.8 %.  He is current with Dr. Evlyn Kanner for endocrinology.  He sees him every 3 months.  Admitted with PNA.  He states he is going to upgrade his pump in the near future (to a hybrid system).  He wears a [employer] 14 day which does not have the capability of alarming for hypo and hyperglycemia.  He states he does experience hypoglycemia a few times a week.  He states he starts to feel low around 70 mg/dL but this is beginning to drop more to < 70 mg/dL recently.  He has been as low as 40 mg/dL.  He would definitely benefit from upgrading his CGM.  He has back up insulin at home and when he is > 300 mg/dL he will inject with backup pens rather than the pump.  He states he feels the pens work quicker.  He stops his pump when he is low and corrects with juice. He turns his pump back on once his BS is normal.   ? ?Encouraged him to move forward with Dr. Evlyn Kanner and upgrade his pump and CGM.  Also asked him to speak with Dr.  Evlyn Kanner regarding 2 hr active insulin time.  This may be contributing to hypos.   ? ?Will continue to follow while inpatient. ? ?Thank you, ?Dulce Sellar, MSN, RN ?Diabetes Coordinator ?Inpatient Diabetes Program ?[phone] (team pager from 8a-5p) ?  ? ? ? ? ?

## 2021-11-17 NOTE — Progress Notes (Signed)
[facility] ? ?PROGRESS NOTE ? ?Clifton Safley ESP:[id number] DOB: 09-05-1956 DOA: 11/16/2021 ?PCP: Reynold Bowen, MD ? ?Brief hospital course ? ?66 year old male with a past medical history of hypertension, hyperlipidemia, anxiety, celiac disease, chronic pain, type I diabetes presented to the ED for evaluation of fevers, cough, confusion, and generalized weakness.  In the ED, patient was febrile and tachycardic.  Labs showing WBC 63.3 with neutrophilic predominance.  Sodium 130.  Chloride 90.  Blood glucose 308.  Bicarb 27, anion gap 13.  BUN 35, creatinine 0.8.  Alk phos 207, remainder of LFTs normal.  COVID and influenza PCR negative.  Lactic acid normal.  Blood cultures drawn.  UA without signs of infection.  Urine culture pending.  CT chest showing extensive left lower lobe pneumonia with small left pleural effusion.  CT head negative for acute finding. Patient was given ceftriaxone, azithromycin, Tylenol, and 2 L fluid boluses. ?   ? ? ?Subjective  ? ?Patient seen and examined, denies shortness of breath. ? ?  ? ?Assessment and Plan: ? ?* CAP (community acquired pneumonia) ?Sepsis ?-Patient presented with fever, tachycardia, leukocytosis ?-No lactic acidosis or hypotension ?-CT chest showing extensive left lower lobe pneumonia with small left pleural effusion.   ?-Sepsis physiology has resolved ?-Started on ceftriaxone and Zithromax ?-Follow blood culture results ? ?Acute metabolic encephalopathy ?Likely due to acute illness.  CT head negative for acute finding and no focal neurodeficit on exam.   ?-Resolved, patient is currently AAO x4 ?-Continue to monitor ? ?Dehydration ?BUN elevated. ?-IV fluid hydration ? ?Type 1 diabetes ([place]) ?-He has an insulin pump.  ?-Patient and wife wanted to continue insulin pump ?-Hemoglobin A1c 6.8 ?-CBG checks every 4 hours. ? ?Elevated alkaline phosphatase level ?-Alk phos minimally elevated to207 ?-GGT is 98; confirming source of alk phos from liver ?-No GI  symptoms ?-Follow alk phos in a.m. ? ?Hypochloremia ?Likely due to home diuretic use. ?-IV fluid hydration, continue to monitor.  Hold diuretic at this time. ? ?Hyponatremia ?- Likely from SIADH from HCTZ ?-Check serum and urine osmolality ?-Follow serum sodium in a.m. ? ?GAD (generalized anxiety disorder) ?- Patient takes Xanax 0.5 up to 5 times a day ?-We will start Xanax 0.5 mg 3 times daily as needed ?-Restart sertraline ? ?HTN (hypertension) ?Stable. ?-We will restart ARB, continue to hold HCTZ due to hyponatremia ?-Restart amlodipine 5 mg daily ? ?Physical deconditioning ?Likely due to acute illness. ?-PT eval, fall precautions ? ? ? ? ?  ? ?Medications ? ?  ? enoxaparin (LOVENOX) injection  40 mg Subcutaneous Q24H  ? insulin pump   Subcutaneous Q4H  ? irbesartan  300 mg Oral Daily  ? potassium chloride  40 mEq Oral Once  ? sertraline  25 mg Oral Daily  ? ? ? Data Reviewed:  ? ?CBG: ? ?Recent Labs  ?Lab 11/17/21 ?0735 11/17/21 ?1142  ?GLUCAP 168* 170*  ? ? ?SpO2: 95 % ?O2 Flow Rate (L/min): 2 L/min  ? ? ?Vitals:  ? 11/16/21 1924 11/16/21 2342 11/17/21 0359 11/17/21 1150  ?BP: (!) 128/59 123/60 (!) 152/65 (!) 145/62  ?Pulse: 69 60 77 68  ?Resp: '14 14 14 15  ' ?Temp: 98.5 ?F (36.9 ?C) 98.4 ?F (36.9 ?C) 98.9 ?F (37.2 ?C) 97.9 ?F (36.6 ?C)  ?TempSrc: Oral Oral Oral Oral  ?SpO2: 96% 97% 95% 95%  ?Weight:      ?Height:      ? ? ? ? ?Data Reviewed: ? ?Basic Metabolic Panel: ?Recent Labs  ?Lab 11/16/21 ?1400  11/17/21 ?0606  ?NA 130* 130*  ?K 3.7 3.4*  ?CL 90* 95*  ?CO2 27 26  ?GLUCOSE 308* 198*  ?BUN 35* 19  ?CREATININE 0.80 0.66  ?CALCIUM 10.5* 8.8*  ? ? ?CBC: ?Recent Labs  ?Lab 11/16/21 ?1300 11/17/21 ?0606  ?WBC 41.8* 42.6*  ?NEUTROABS 37.6*  --   ?HGB 13.1 11.1*  ?HCT 38.2* 33.1*  ?MCV 89.7 92.5  ?PLT 274 289  ? ? ?LFT ?Recent Labs  ?Lab 11/16/21 ?1400  ?AST 32  ?ALT 36  ?ALKPHOS 207*  ?BILITOT 1.2  ?PROT 7.6  ?ALBUMIN 3.5  ? ?  ?Micro : Blood cultures x2 negative to date, urine culture  pending ? ? ? ?Antibiotics: ?Anti-infectives (From admission, onward)  ? ? Start     Dose/Rate Route Frequency Ordered Stop  ? 11/16/21 1430  cefTRIAXone (ROCEPHIN) 2 g in sodium chloride 0.9 % 100 mL IVPB       ? 2 g ?200 mL/hr over 30 Minutes Intravenous Every 24 hours 11/16/21 1419 11/21/21 1429  ? 11/16/21 1430  azithromycin (ZITHROMAX) 500 mg in sodium chloride 0.9 % 250 mL IVPB       ? 500 mg ?250 mL/hr over 60 Minutes Intravenous Every 24 hours 11/16/21 1419 11/21/21 1429  ? ?  ? ? ? ? ? ?DVT prophylaxis: Lovenox ? ?Code Status: Full code ? ?Family Communication: Discussed with wife at bedside ? ?CONSULTS: ? ? ? ?Objective  ? ? ?Physical Examination: ? ? ?General-appears in no acute distress ?Heart-S1-S2, regular, no murmur auscultated ?Lungs-clear to auscultation bilaterally, no wheezing or crackles auscultated ?Abdomen-soft, nontender, no organomegaly ?Extremities-no edema in the lower extremities ?Neuro-alert, oriented x3, no focal deficit noted ? ?Status is: Inpatient: Community-acquired pneumonia ? ? ?  ? ?Oswald Hillock ?  ?[facility] ?If 7PM-7AM, please contact night-coverage at [email or web address], ?Office  [phone] ? ? ?11/17/2021, 3:37 PM  LOS: 1 day  ? ? ? ? ? ? ? ? ? ? ?  ?

## 2021-11-17 NOTE — Progress Notes (Signed)
?  Transition of Care (TOC) Screening Note ? ? ?Patient Details  ?Name: Kyle Velazquez ?Date of Birth: 1955/12/06 ? ? ?Transition of Care (TOC) CM/SW Contact:    ?Daulton Harbaugh, Meriam Sprague, RN ?Phone Number: ?11/17/2021, 1:56 PM ? ? ? ?Transition of Care Department [facility]) has reviewed patient and no TOC needs have been identified at this time. We will continue to monitor patient advancement through interdisciplinary progression rounds. If new patient transition needs arise, please place a TOC consult. ?  ?

## 2021-11-17 NOTE — Hospital Course (Addendum)
66 year old male with a past medical history of hypertension, hyperlipidemia, anxiety, celiac disease, chronic pain, type I diabetes presented to the ED for evaluation of fevers, cough, confusion, and generalized weakness.   ? ?In the ED, patient was febrile and tachycardic.  Labs showing WBC 25.6 with neutrophilic predominance.  Sodium 130.  Chloride 90.  Blood glucose 308.  Bicarb 27, anion gap 13.  BUN 35, creatinine 0.8.  Alk phos 207, remainder of LFTs normal.  COVID and influenza PCR negative.  Lactic acid normal.  Blood cultures drawn.  UA without signs of infection.  Urine culture pending.  CT chest showing extensive left lower lobe pneumonia with small left pleural effusion.  CT head negative for acute finding. Patient was given ceftriaxone, azithromycin, Tylenol, and 2 L fluid boluses. ?  ?

## 2021-11-17 NOTE — Progress Notes (Signed)
Blood sugar at bed time 196, midnight 145, early morning 198 per [facility] CNA. ?

## 2021-11-18 ENCOUNTER — Inpatient Hospital Stay (HOSPITAL_COMMUNITY): Payer: Medicare Other

## 2021-11-18 DIAGNOSIS — E876 Hypokalemia: Secondary | ICD-10-CM

## 2021-11-18 DIAGNOSIS — I160 Hypertensive urgency: Secondary | ICD-10-CM

## 2021-11-18 LAB — CBC
HCT: 30.5 % — ABNORMAL LOW (ref 39.0–52.0)
Hemoglobin: 10.6 g/dL — ABNORMAL LOW (ref 13.0–17.0)
MCH: 31.5 pg (ref 26.0–34.0)
MCHC: 34.8 g/dL (ref 30.0–36.0)
MCV: 90.8 fL (ref 80.0–100.0)
Platelets: 298 10*3/uL (ref 150–400)
RBC: 3.36 MIL/uL — ABNORMAL LOW (ref 4.22–5.81)
RDW: 14 % (ref 11.5–15.5)
WBC: 26.3 10*3/uL — ABNORMAL HIGH (ref 4.0–10.5)
nRBC: 0 % (ref 0.0–0.2)

## 2021-11-18 LAB — COMPREHENSIVE METABOLIC PANEL
ALT: 31 U/L (ref 0–44)
AST: 34 U/L (ref 15–41)
Albumin: 2.1 g/dL — ABNORMAL LOW (ref 3.5–5.0)
Alkaline Phosphatase: 173 U/L — ABNORMAL HIGH (ref 38–126)
Anion gap: 9 (ref 5–15)
BUN: 13 mg/dL (ref 8–23)
CO2: 28 mmol/L (ref 22–32)
Calcium: 9 mg/dL (ref 8.9–10.3)
Chloride: 94 mmol/L — ABNORMAL LOW (ref 98–111)
Creatinine, Ser: 0.59 mg/dL — ABNORMAL LOW (ref 0.61–1.24)
GFR, Estimated: >60 mL/min (ref >60)
Glucose, Bld: 198 mg/dL — ABNORMAL HIGH (ref 70–99)
Potassium: 3.4 mmol/L — ABNORMAL LOW (ref 3.5–5.1)
Sodium: 131 mmol/L — ABNORMAL LOW (ref 135–145)
Total Bilirubin: 0.7 mg/dL (ref 0.3–1.2)
Total Protein: 6.1 g/dL — ABNORMAL LOW (ref 6.5–8.1)

## 2021-11-18 LAB — GLUCOSE, CAPILLARY
Glucose-Capillary: 210 mg/dL — ABNORMAL HIGH (ref 70–99)
Glucose-Capillary: 221 mg/dL — ABNORMAL HIGH (ref 70–99)
Glucose-Capillary: 284 mg/dL — ABNORMAL HIGH (ref 70–99)

## 2021-11-18 LAB — URINE CULTURE: Culture: 10000 — AB

## 2021-11-18 LAB — LEGIONELLA PNEUMOPHILA SEROGP 1 UR AG: L. pneumophila Serogp 1 Ur Ag: NEGATIVE

## 2021-11-18 MED ORDER — CEFDINIR 300 MG PO CAPS: 300.0000 mg | ORAL_CAPSULE | Freq: Two times a day (BID) | ORAL | 0 refills | Status: DC

## 2021-11-18 MED ORDER — AZITHROMYCIN 250 MG PO TABS: 250.0000 mg | ORAL_TABLET | Freq: Every day | ORAL | 0 refills | Status: DC

## 2021-11-18 MED ORDER — VALACYCLOVIR HCL 500 MG PO TABS
500.0000 mg | ORAL_TABLET | Freq: Every day | ORAL | Status: DC
  Administered 2021-11-18: 500 mg via ORAL
  Filled 2021-11-18: qty 1

## 2021-11-18 MED ORDER — POTASSIUM CHLORIDE CRYS ER 20 MEQ PO TBCR
40.0000 meq | EXTENDED_RELEASE_TABLET | Freq: Once | ORAL | Status: AC
  Administered 2021-11-18: 40 meq via ORAL
  Filled 2021-11-18: qty 2

## 2021-11-18 MED ORDER — AMLODIPINE BESYLATE 5 MG PO TABS
2.5000 mg | ORAL_TABLET | Freq: Every day | ORAL | Status: DC
  Administered 2021-11-18: 2.5 mg via ORAL
  Filled 2021-11-18: qty 1

## 2021-11-18 NOTE — Progress Notes (Signed)
During morning med pass patient states that his home glucometer is different from the hospital glucometer reading. Pt. Educated that CBG on hospital machine is similar to glucose on blood work. Educated to have glucometer checked, pt reports he has already checked with his endocrinologist whose monitor is also different from his home monitor. Pt reports being controlled with his home monitor and no other problems with blood glucose at home. Pt reports he will continue to treat based on home glucometer.  ?

## 2021-11-18 NOTE — Progress Notes (Addendum)
SATURATION QUALIFICATIONS: (This note is used to comply with regulatory documentation for home oxygen) ? ?Patient Saturations on Room Air at Rest = 100% ? ?Patient Saturations on Room Air while Ambulating = 97% ? ?Please briefly explain why patient needs home oxygen: pt does not qualify for home oxygen.  ? ?Pt maintained consistent HR while ambulating, between 70-75 bpm.  ?

## 2021-11-18 NOTE — Progress Notes (Signed)
ABI's have been completed. ?Preliminary results can be found in CV Proc through chart review.  ? ?11/18/21 11:00 AM ?Olen Cordial RVT   ?

## 2021-11-18 NOTE — Progress Notes (Signed)
Discharge education completed with pt and pt's wife at bedside. No questions or concerns at this time. All discharge medications reviewed as well. Pt discharged home with all belongings.  ?

## 2021-11-18 NOTE — Discharge Summary (Signed)
Physician Discharge Summary   Patient: Kyle Velazquez MRN: [id number] DOB: 24-Dec-1955  Admit date:     11/16/2021  Discharge date: 11/18/21  Discharge Physician: Alberteen Sam   PCP: Adrian Prince, MD   Recommendations at discharge:  Follow up with Dr. Evlyn Kanner in 1 week Dr. Evlyn Kanner: Please obtain CBC in follow up to confirm WBC back to normal in 1-2 weeks Dr. Evlyn Kanner: Please repeat CXR in 3-4 weeks to document resolution\      Discharge Diagnoses: Principal Problem:   Severe sepsis due to community acquired pneumonia Active Problems:   Acute metabolic encephalopathy   Controlled type 1 diabetes mellitus with hyperglycemia (HCC)   HTN (hypertension)   GAD (generalized anxiety disorder)   Hyponatremia   Hypochloremia   Hypokalemia       Hospital Course: Kyle Velazquez is a 66 y.o. M with T1DM, well controlled, chronic pain and celiac disease who presented with cough, fever, and confusion.    In the ED, patient was febrile and tachycardic, WBC 41K, glucose elevated, COVID negative, CXR showed LLL pneumonia.           Assessment and Plan: Severe Sepsis due to left lower lobe pneumonia Admitted with infection, confusion.  Treated with fluids, antibiotics.  Patient now mentating at baseline, taking orals.  Temp < 100 F, heart rate < 100bpm, RR < 24, SpO2 at baseline.   Stable for discharge.      Acute metabolic encephalopathy At baseline, patient has no cognitive impairment.  At presentation, he cannot remember what was going on, was noted to have decreased mentation by admitting MD.  This was likely encephalopathy from infection, resolved.    Controlled type 1 diabetes mellitus with hyperglycemia (HCC)  Elevated alkaline phosphatase level Due to sepsis, mild.    Hyponatremia  GAD (generalized anxiety disorder)  HTN (hypertension)  Hypokalemia           Pain control - [place] Controlled Substance Reporting System database was reviewed.     Disposition: Home  DISCHARGE MEDICATION: Allergies as of 11/18/2021   No Known Allergies      Medication List     TAKE these medications    ALPRAZolam 1 MG tablet Commonly known as: XANAX TAKE 1/2 TABLET BY MOUTH FIVE TIMES DAILY What changed:  how much to take how to take this when to take this   amLODipine 5 MG tablet Commonly known as: NORVASC Take 1 tablet (5 mg total) by mouth daily. What changed: how much to take   azithromycin 250 MG tablet Commonly known as: Zithromax Z-Pak Take 1 tablet (250 mg total) by mouth daily. Take 2 tablets (500 mg) on  Day 1,  followed by 1 tablet (250 mg) once daily on Days 2 through 5.   betamethasone dipropionate 0.05 % cream Apply 1 drop topically 2 (two) times daily as needed (skin inflammation).   cefdinir 300 MG capsule Commonly known as: OMNICEF Take 1 capsule (300 mg total) by mouth 2 (two) times daily.   fluticasone 50 MCG/ACT nasal spray Commonly known as: FLONASE Place 2 sprays into both nostrils at bedtime as needed for allergies or rhinitis.   FreeStyle Libre 14 Day Sensor Misc by Does not apply route.   glucose blood test strip 1 each by Other route as needed for other. Use as instructed   hydrochlorothiazide 25 MG tablet Commonly known as: HYDRODIURIL Take 1 tablet (25 mg total) by mouth daily. Patient needs appointment for any future refills.  Please call  office at [phone] to schedule appointment. 1st attempt.   hydrocortisone 2.5 % cream Apply 1 application. topically 2 (two) times daily as needed (skin irritation). Apply topically 2 (two) times daily as needed.   hydrocortisone 2.5 % rectal cream Commonly known as: ANUSOL-HC Apply 1 application. topically 2 (two) times daily as needed for anal itching.   hyoscyamine 0.125 MG SL tablet Commonly known as: LEVSIN SL Place 1 tablet (0.125 mg total) under the tongue every 4 (four) hours as needed. What changed: reasons to take this   insulin  aspart 100 UNIT/ML injection Commonly known as: novoLOG Inject into the skin 3 (three) times daily before meals. INJECT UP TO 70 UNITS DAILY ON A SLIDING SCALE   NovoLOG 100 UNIT/ML injection Generic drug: insulin aspart Inject 0-70 Units into the skin daily.   ketoconazole 2 % cream Commonly known as: NIZORAL Apply 1 application. topically in the morning and at bedtime.   omeprazole 40 MG capsule Commonly known as: PRILOSEC Take 40 mg by mouth daily.   Percocet 10-325 MG tablet Generic drug: oxyCODONE-acetaminophen Take 1 tablet by mouth every 6 (six) hours as needed for pain.   potassium chloride SA 20 MEQ tablet Commonly known as: KLOR-CON M Take 1 tablet (20 mEq total) by mouth daily. What changed: when to take this   sertraline 25 MG tablet Commonly known as: ZOLOFT TAKE 1 TABLET(25 MG) BY MOUTH DAILY What changed:  how much to take how to take this when to take this additional instructions   sildenafil 50 MG tablet Commonly known as: VIAGRA Take 50 mg by mouth as needed for erectile dysfunction.   Testosterone 20.25 MG/ACT (1.62%) Gel Apply 3 Pump topically daily.   valACYclovir 500 MG tablet Commonly known as: VALTREX Take 500 mg by mouth daily.   valsartan 320 MG tablet Commonly known as: Diovan Take 1 tablet (320 mg total) by mouth daily.        Follow-up Information     Adrian Prince, MD. Schedule an appointment as soon as possible for a visit in 1 week(s).   Specialty: Endocrinology Contact information: [address] [place] [address] [phone]                Discharge Instructions     Discharge instructions   Complete by: As directed    From Dr. Maryfrances Bunnell: You were admitted for pneumonia sepsis. You were treated with Rocephin and Azithromycin  You should complete therapy with 3 more days azithromycin and cefdinir  Take cefdinir 300 mg twice daily Take azithromycin 250 mg once daily  Start both tomorrow, take for 3  more days  Go see Dr. Evlyn Kanner in 1 week Have him check your blood counts and repeat a Chest x-ray in 3-4 weeks  Resume your other home medicines Unfortunately, the combination of Percocet and Xanax also increases your risk of pneumonia.  If you have worsening fever, chest pain, trouble breathing that won't go away or confusion again, come back to the ER   Increase activity slowly   Complete by: As directed        Discharge Exam: Filed Weights   11/16/21 1247  Weight: 63 kg   General: Pt is alert, awake, not in acute distress Cardiovascular: RRR, nl S1-S2, no murmurs appreciated.   No LE edema.   Respiratory: Normal respiratory rate and rhythm.  CTAB without rales or wheezes. Abdominal: Abdomen soft and non-tender.  No distension or HSM.   Neuro/Psych: Strength symmetric in upper and  lower extremities.  Judgment and insight appear normal.   Condition at discharge: Good  The results of significant diagnostics from this hospitalization (including imaging, microbiology, ancillary and laboratory) are listed below for reference.   Imaging Studies: DG Chest 2 View  Result Date: 11/16/2021 CLINICAL DATA:  Fever and chills since Wednesday. EXAM: CHEST - 2 VIEW COMPARISON:  None. FINDINGS: The heart size and mediastinal contours are within normal limits. Normal pulmonary vascularity. Patchy left lower lobe consolidation. No pleural effusion or pneumothorax. No acute osseous abnormality. IMPRESSION: 1. Left lower lobe pneumonia. Followup PA and lateral chest X-ray is recommended in 3-4 weeks following trial of antibiotic therapy to ensure resolution. Electronically Signed   By: Obie DredgeWilliam T Derry M.D.   On: 11/16/2021 13:31   CT Head Wo Contrast  Result Date: 11/16/2021 CLINICAL DATA:  Mental status change. Unknown cause. Seizure and weakness. EXAM: CT HEAD WITHOUT CONTRAST TECHNIQUE: Contiguous axial images were obtained from the base of the skull through the vertex without intravenous contrast.  RADIATION DOSE REDUCTION: This exam was performed according to the departmental dose-optimization program which includes automated exposure control, adjustment of the mA and/or kV according to patient size and/or use of iterative reconstruction technique. COMPARISON:  CT head dated October 31, 2020 FINDINGS: Brain: No evidence of acute infarction, hemorrhage, hydrocephalus, extra-axial collection or mass lesion/mass effect. Vascular: No hyperdense vessel or unexpected calcification. Skull: Normal. Negative for fracture or focal lesion. Sinuses/Orbits: Mucosal thickening of the right maxillary sinus as well as patchy opacification of the ethmoid air cells, which of significantly improved from prior examination Other: None. IMPRESSION: 1.  No acute intracranial abnormality. 2. Mild paranasal sinus disease, which have improved since prior CT examination of October 31, 2020. Electronically Signed   By: Larose HiresImran  Ahmed D.O.   On: 11/16/2021 15:38   CT Chest W Contrast  Result Date: 11/16/2021 CLINICAL DATA:  Pneumonia, complication suspected, xray done. Fever and weakness. EXAM: CT CHEST WITH CONTRAST TECHNIQUE: Multidetector CT imaging of the chest was performed during intravenous contrast administration. RADIATION DOSE REDUCTION: This exam was performed according to the departmental dose-optimization program which includes automated exposure control, adjustment of the mA and/or kV according to patient size and/or use of iterative reconstruction technique. CONTRAST:  75mL OMNIPAQUE IOHEXOL 300 MG/ML  SOLN COMPARISON:  Chest radiographs 11/16/2021 FINDINGS: Cardiovascular: Normal caliber of the thoracic aorta. Normal heart size. No pericardial effusion. Mediastinum/Nodes: Borderline enlarged left hilar, subcarinal, and right paratracheal lymph nodes, likely reactive. Unremarkable esophagus and thyroid. Lungs/Pleura: Small left pleural effusion primarily located in the superior aspect of the left major fissure. No  pneumothorax. Extensive, dense consolidation in the left lower lobe. Clear right lung. Patent central airways. Upper Abdomen: No acute abnormality. Musculoskeletal: No suspicious osseous lesion. Mild thoracic spondylosis. IMPRESSION: Extensive left lower lobe pneumonia with small left pleural effusion. Electronically Signed   By: Sebastian AcheAllen  Grady M.D.   On: 11/16/2021 15:40   VAS US ABI WITH/WO TBI  Result Date: 11/18/2021  LOWER EXTREMITY DOPPLER STUDY Patient Name:  Verdell CarmineJERRY Zayas  Date of Exam:   11/18/2021 Medical Rec #: [id number]        Accession #:    [id number] Date of Birth: 09/24/1955         Patient Gender: M Patient Age:   [age] years [facility] Procedure:      VAS US ABI WITH/WO TBI Referring Phys: Cristal DeerHRISTOPHER Nghia Mcentee --------------------------------------------------------------------------------  Indications: High BPM score. High Risk Factors: Hypertension, Diabetes.  Comparison Study:  No prior studies. Performing Technologist: Olen Cordial RVT  Examination Guidelines: A complete evaluation includes at minimum, Doppler waveform signals and systolic blood pressure reading at the level of bilateral brachial, anterior tibial, and posterior tibial arteries, when vessel segments are accessible. Bilateral testing is considered an integral part of a complete examination. Photoelectric Plethysmograph (PPG) waveforms and toe systolic pressure readings are included as required and additional duplex testing as needed. Limited examinations for reoccurring indications may be performed as noted.  ABI Findings: +---------+------------------+-----+---------+--------+  Right     Rt Pressure (mmHg) Index Waveform  Comment   +---------+------------------+-----+---------+--------+  Brachial  156                      triphasic           +---------+------------------+-----+---------+--------+  PTA       179                1.15  triphasic           +---------+------------------+-----+---------+--------+   DP        171                1.10  triphasic           +---------+------------------+-----+---------+--------+  Great Toe 112                0.72                      +---------+------------------+-----+---------+--------+ +---------+------------------+-----+---------+-------+  Left      Lt Pressure (mmHg) Index Waveform  Comment  +---------+------------------+-----+---------+-------+  Brachial  144                      triphasic          +---------+------------------+-----+---------+-------+  PTA       179                1.15  triphasic          +---------+------------------+-----+---------+-------+  DP        167                1.07  triphasic          +---------+------------------+-----+---------+-------+  Great Toe 94                 0.60                     +---------+------------------+-----+---------+-------+ +-------+-----------+-----------+------------+------------+  ABI/TBI Today's ABI Today's TBI Previous ABI Previous TBI  +-------+-----------+-----------+------------+------------+  Right   1.15        0.72                                   +-------+-----------+-----------+------------+------------+  Left    1.15        0.6                                    +-------+-----------+-----------+------------+------------+  Summary: Right: Resting right ankle-brachial index is within normal range. No evidence of significant right lower extremity arterial disease. The right toe-brachial index is normal. Left: Resting left ankle-brachial index is within normal range. No evidence of significant left lower extremity arterial disease. The left toe-brachial index is abnormal.  *See table(s) above for measurements and observations.  Electronically  signed by Sherald Hess MD on 11/18/2021 at 1:54:04 PM.    Final    DG ESOPHAGUS W DOUBLE CM (HD)  Result Date: 11/03/2021 CLINICAL DATA:  Sensation of food becoming stuck in the upper esophagus, globus sensation, approximately 10 years status post ACDF EXAM:  ESOPHAGUS/BARIUM SWALLOW/TABLET STUDY TECHNIQUE: Combined double and single contrast examination was performed using effervescent crystals, high-density barium, and thin liquid barium. This exam was performed by Sheliah Plane, PA-C, and was supervised and interpreted by Loralie Champagne, MD. FLUOROSCOPY TIME:  Radiation Exposure Index (as provided by the fluoroscopic device): 22.539mG y COMPARISON:  2019 esophagram. FINDINGS: Swallowing: Appears normal. No vestibular penetration or aspiration seen. Pharynx: Unremarkable. Esophagus: Normal appearance. Esophageal motility: Loss of progression in primary stripping wave leading to incomplete esophageal emptying. Hiatal Hernia: Possible very small sliding type hiatal hernia present. Gastroesophageal reflux: Elicitied to the level of the carina Ingested 13mm barium tablet: Passed normally Other: ACDF at C3-4 partially evaluated without evidence for loosening or fracture of hardware. IMPRESSION: 1. Nonspecific esophageal motility disorder. 2. No esophageal mass or stricture. 3. No definite hiatal hernia. 4. Gastroesophageal reflux. Electronically Signed   By: Rudie Meyer M.D.   On: 11/03/2021 12:55    Microbiology: Results for orders placed or performed during the hospital encounter of 11/16/21  Resp Panel by RT-PCR (Flu A&B, Covid) Nasopharyngeal Swab     Status: None   Collection Time: 11/16/21 12:45 PM   Specimen: Nasopharyngeal Swab; Nasopharyngeal(NP) swabs in vial transport medium  Result Value Ref Range Status   SARS Coronavirus 2 by RT PCR NEGATIVE NEGATIVE Final    Comment: (NOTE) SARS-CoV-2 target nucleic acids are NOT DETECTED.  The SARS-CoV-2 RNA is generally detectable in upper respiratory specimens during the acute phase of infection. The lowest concentration of SARS-CoV-2 viral copies this assay can detect is 138 copies/mL. A negative result does not preclude SARS-Cov-2 infection and should not be used as the sole basis for treatment  or other patient management decisions. A negative result may occur with  improper specimen collection/handling, submission of specimen other than nasopharyngeal swab, presence of viral mutation(s) within the areas targeted by this assay, and inadequate number of viral copies(<138 copies/mL). A negative result must be combined with clinical observations, patient history, and epidemiological information. The expected result is Negative.  Fact Sheet for Patients:  BloggerCourse.com  Fact Sheet for Healthcare Providers:  SeriousBroker.it  This test is no t yet approved or cleared by the [place] FDA and  has been authorized for detection and/or diagnosis of SARS-CoV-2 by FDA under an Emergency Use Authorization (EUA). This EUA will remain  in effect (meaning this test can be used) for the duration of the COVID-19 declaration under Section 564(b)(1) of the Act, 21 U.S.C.section 360bbb-3(b)(1), unless the authorization is terminated  or revoked sooner.       Influenza A by PCR NEGATIVE NEGATIVE Final   Influenza B by PCR NEGATIVE NEGATIVE Final    Comment: (NOTE) The Xpert Xpress SARS-CoV-2/FLU/RSV plus assay is intended as an aid in the diagnosis of influenza from Nasopharyngeal swab specimens and should not be used as a sole basis for treatment. Nasal washings and aspirates are unacceptable for Xpert Xpress SARS-CoV-2/FLU/RSV testing.  Fact Sheet for Patients: BloggerCourse.com  Fact Sheet for Healthcare Providers: SeriousBroker.it  This test is not yet approved or cleared by the [place] FDA and has been authorized for detection and/or diagnosis of SARS-CoV-2 by FDA under an Emergency Use Authorization (EUA). This EUA will remain  in effect (meaning this test can be used) for the duration of the COVID-19 declaration under Section 564(b)(1) of the Act, 21 U.S.C. section  360bbb-3(b)(1), unless the authorization is terminated or revoked.  Performed at [facility], [address], [place], [place] [address]   Culture, blood (Routine x 2)     Status: None (Preliminary result)   Collection Time: 11/16/21  1:00 PM   Specimen: Left Antecubital; Blood  Result Value Ref Range Status   Specimen Description   Final    LEFT ANTECUBITAL Performed at [facility], [address], [place], [place] [address]    Special Requests   Final    BOTTLES DRAWN AEROBIC AND ANAEROBIC Blood Culture adequate volume Performed at [facility], [address] [place], [place] [address]    Culture   Final    NO GROWTH 2 DAYS Performed at [facility], [address], [place], [place] [address]    Report Status PENDING  Incomplete  Culture, blood (Routine x 2)     Status: None (Preliminary result)   Collection Time: 11/16/21  2:20 PM   Specimen: Right Antecubital; Blood  Result Value Ref Range Status   Specimen Description   Final    RIGHT ANTECUBITAL Performed at [facility], [address] [place], [place] [address]    Special Requests   Final    BOTTLES DRAWN AEROBIC AND ANAEROBIC Blood Culture adequate volume Performed at [facility], [address], [place], [place] [address]    Culture   Final    NO GROWTH 2 DAYS Performed at [facility], [address], [place], [place] [address]    Report Status PENDING  Incomplete  Urine Culture     Status: Abnormal   Collection Time: 11/16/21  2:45 PM   Specimen: In/Out Cath Urine  Result Value Ref Range Status   Specimen Description   Final    IN/OUT CATH URINE Performed at [facility], [address] [place], [place] [address]    Special Requests   Final    NONE Performed at [facility], [address], [place], [place] [address]    Culture (A)  Final     10,000 COLONIES/mL VIRIDANS STREPTOCOCCUS Standardized susceptibility testing for this organism is not available. Performed at [facility], [address], [place], [place] [address]    Report Status 11/18/2021 FINAL  Final    Labs: CBC: Recent Labs  Lab 11/16/21 1300 11/17/21 0606 11/18/21 0558  WBC 41.8* 42.6* 26.3*  NEUTROABS 37.6*  --   --   HGB 13.1 11.1* 10.6*  HCT 38.2* 33.1* 30.5*  MCV 89.7 92.5 90.8  PLT 274 289 298   Basic Metabolic Panel: Recent Labs  Lab 11/16/21 1400 11/17/21 0606 11/18/21 0558  NA 130* 130* 131*  K 3.7 3.4* 3.4*  CL 90* 95* 94*  CO2 GLUCOSE 308* 198* 198*  BUN 35* 19 13  CREATININE 0.80 0.66 0.59*  CALCIUM 10.5* 8.8* 9.0   Liver Function Tests: Recent Labs  Lab 11/16/21 1400 11/18/21 0558  AST 32 34  ALT 36 31  ALKPHOS 207* 173*  BILITOT 1.2 0.7  PROT 7.6 6.1*  ALBUMIN 3.5 2.1*   CBG: Recent Labs  Lab 11/17/21 1650 11/17/21 2118 11/18/21 0008 11/18/21 0509 11/18/21 1151  GLUCAP 222* 272* 284* 210* 221*    Discharge time spent: 40 minutes.  Signed: Alberteen Sam, MD  [facility] 11/18/2021

## 2021-11-18 NOTE — Assessment & Plan Note (Signed)
Resolved with supplementation and starting spironolactone. 

## 2021-11-19 ENCOUNTER — Encounter: Payer: Self-pay | Admitting: Internal Medicine

## 2021-11-19 ENCOUNTER — Telehealth: Payer: Self-pay | Admitting: Internal Medicine

## 2021-11-19 ENCOUNTER — Encounter (HOSPITAL_COMMUNITY): Payer: Self-pay | Admitting: Emergency Medicine

## 2021-11-19 ENCOUNTER — Inpatient Hospital Stay (HOSPITAL_COMMUNITY)
Admission: EM | Admit: 2021-11-19 | Discharge: 2021-11-24 | DRG: 867 | Disposition: A | Discharge status: Home / Self Care | Payer: Medicare Other | Attending: Internal Medicine | Admitting: Internal Medicine

## 2021-11-19 ENCOUNTER — Other Ambulatory Visit: Payer: Self-pay

## 2021-11-19 DIAGNOSIS — Z87891 Personal history of nicotine dependence: Secondary | ICD-10-CM

## 2021-11-19 DIAGNOSIS — I10 Essential (primary) hypertension: Secondary | ICD-10-CM | POA: Diagnosis present

## 2021-11-19 DIAGNOSIS — E871 Hypo-osmolality and hyponatremia: Secondary | ICD-10-CM | POA: Diagnosis present

## 2021-11-19 DIAGNOSIS — R7989 Other specified abnormal findings of blood chemistry: Secondary | ICD-10-CM

## 2021-11-19 DIAGNOSIS — Z9641 Presence of insulin pump (external) (internal): Secondary | ICD-10-CM | POA: Diagnosis present

## 2021-11-19 DIAGNOSIS — F411 Generalized anxiety disorder: Secondary | ICD-10-CM | POA: Diagnosis present

## 2021-11-19 DIAGNOSIS — Z20822 Contact with and (suspected) exposure to covid-19: Secondary | ICD-10-CM | POA: Diagnosis present

## 2021-11-19 DIAGNOSIS — B3789 Other sites of candidiasis: Secondary | ICD-10-CM | POA: Diagnosis present

## 2021-11-19 DIAGNOSIS — B49 Unspecified mycosis: Secondary | ICD-10-CM | POA: Diagnosis not present

## 2021-11-19 DIAGNOSIS — E785 Hyperlipidemia, unspecified: Secondary | ICD-10-CM | POA: Diagnosis present

## 2021-11-19 DIAGNOSIS — K900 Celiac disease: Secondary | ICD-10-CM | POA: Diagnosis present

## 2021-11-19 DIAGNOSIS — E875 Hyperkalemia: Secondary | ICD-10-CM

## 2021-11-19 DIAGNOSIS — E1165 Type 2 diabetes mellitus with hyperglycemia: Secondary | ICD-10-CM

## 2021-11-19 DIAGNOSIS — Z79899 Other long term (current) drug therapy: Secondary | ICD-10-CM

## 2021-11-19 DIAGNOSIS — G8929 Other chronic pain: Secondary | ICD-10-CM | POA: Diagnosis present

## 2021-11-19 DIAGNOSIS — E113291 Type 2 diabetes mellitus with mild nonproliferative diabetic retinopathy without macular edema, right eye: Secondary | ICD-10-CM | POA: Diagnosis present

## 2021-11-19 DIAGNOSIS — D638 Anemia in other chronic diseases classified elsewhere: Secondary | ICD-10-CM

## 2021-11-19 DIAGNOSIS — D75839 Thrombocytosis, unspecified: Secondary | ICD-10-CM

## 2021-11-19 DIAGNOSIS — E1140 Type 2 diabetes mellitus with diabetic neuropathy, unspecified: Secondary | ICD-10-CM | POA: Diagnosis present

## 2021-11-19 DIAGNOSIS — J189 Pneumonia, unspecified organism: Secondary | ICD-10-CM | POA: Diagnosis present

## 2021-11-19 DIAGNOSIS — D72829 Elevated white blood cell count, unspecified: Secondary | ICD-10-CM

## 2021-11-19 DIAGNOSIS — B377 Candidal sepsis: Principal | ICD-10-CM

## 2021-11-19 DIAGNOSIS — E1149 Type 2 diabetes mellitus with other diabetic neurological complication: Secondary | ICD-10-CM

## 2021-11-19 DIAGNOSIS — K219 Gastro-esophageal reflux disease without esophagitis: Secondary | ICD-10-CM | POA: Diagnosis present

## 2021-11-19 DIAGNOSIS — Z794 Long term (current) use of insulin: Secondary | ICD-10-CM

## 2021-11-19 DIAGNOSIS — Z8249 Family history of ischemic heart disease and other diseases of the circulatory system: Secondary | ICD-10-CM

## 2021-11-19 LAB — RESP PANEL BY RT-PCR (FLU A&B, COVID) ARPGX2
Influenza A by PCR: NEGATIVE
Influenza B by PCR: NEGATIVE
SARS Coronavirus 2 by RT PCR: NEGATIVE

## 2021-11-19 LAB — URINALYSIS, ROUTINE W REFLEX MICROSCOPIC
Bilirubin Urine: NEGATIVE
Glucose, UA: NEGATIVE mg/dL
Hgb urine dipstick: NEGATIVE
Ketones, ur: NEGATIVE mg/dL
Leukocytes,Ua: NEGATIVE
Nitrite: NEGATIVE
Protein, ur: NEGATIVE mg/dL
Specific Gravity, Urine: 1.013 (ref 1.005–1.030)
pH: 7 (ref 5.0–8.0)

## 2021-11-19 LAB — BLOOD CULTURE ID PANEL (REFLEXED) - BCID2
A.calcoaceticus-baumannii: NOT DETECTED
Bacteroides fragilis: NOT DETECTED
Candida albicans: NOT DETECTED
Candida auris: NOT DETECTED
Candida glabrata: NOT DETECTED
Candida krusei: NOT DETECTED
Candida parapsilosis: DETECTED — AB
Candida tropicalis: NOT DETECTED
Cryptococcus neoformans/gattii: NOT DETECTED
Enterobacter cloacae complex: NOT DETECTED
Enterobacterales: NOT DETECTED
Enterococcus Faecium: NOT DETECTED
Enterococcus faecalis: NOT DETECTED
Escherichia coli: NOT DETECTED
Haemophilus influenzae: NOT DETECTED
Klebsiella aerogenes: NOT DETECTED
Klebsiella oxytoca: NOT DETECTED
Klebsiella pneumoniae: NOT DETECTED
Listeria monocytogenes: NOT DETECTED
Neisseria meningitidis: NOT DETECTED
Proteus species: NOT DETECTED
Pseudomonas aeruginosa: NOT DETECTED
Salmonella species: NOT DETECTED
Serratia marcescens: NOT DETECTED
Staphylococcus aureus (BCID): NOT DETECTED
Staphylococcus epidermidis: NOT DETECTED
Staphylococcus lugdunensis: NOT DETECTED
Staphylococcus species: NOT DETECTED
Stenotrophomonas maltophilia: NOT DETECTED
Streptococcus agalactiae: NOT DETECTED
Streptococcus pneumoniae: NOT DETECTED
Streptococcus pyogenes: NOT DETECTED
Streptococcus species: NOT DETECTED

## 2021-11-19 LAB — BASIC METABOLIC PANEL
Anion gap: 7 (ref 5–15)
BUN: 12 mg/dL (ref 8–23)
CO2: 31 mmol/L (ref 22–32)
Calcium: 9.4 mg/dL (ref 8.9–10.3)
Chloride: 96 mmol/L — ABNORMAL LOW (ref 98–111)
Creatinine, Ser: 0.54 mg/dL — ABNORMAL LOW (ref 0.61–1.24)
GFR, Estimated: >60 mL/min (ref >60)
Glucose, Bld: 176 mg/dL — ABNORMAL HIGH (ref 70–99)
Potassium: 4 mmol/L (ref 3.5–5.1)
Sodium: 134 mmol/L — ABNORMAL LOW (ref 135–145)

## 2021-11-19 LAB — GLUCOSE, CAPILLARY
Glucose-Capillary: 150 mg/dL — ABNORMAL HIGH (ref 70–99)
Glucose-Capillary: 138 mg/dL — ABNORMAL HIGH (ref 70–99)

## 2021-11-19 LAB — CBC WITH DIFFERENTIAL/PLATELET
Abs Immature Granulocytes: 0 10*3/uL (ref 0.00–0.07)
Band Neutrophils: 2 %
Basophils Absolute: 0 10*3/uL (ref 0.0–0.1)
Basophils Relative: 0 %
Eosinophils Absolute: 0.3 10*3/uL (ref 0.0–0.5)
Eosinophils Relative: 1 %
HCT: 35.4 % — ABNORMAL LOW (ref 39.0–52.0)
Hemoglobin: 11.9 g/dL — ABNORMAL LOW (ref 13.0–17.0)
Lymphocytes Relative: 15 %
Lymphs Abs: 3.8 10*3/uL (ref 0.7–4.0)
MCH: 31 pg (ref 26.0–34.0)
MCHC: 33.6 g/dL (ref 30.0–36.0)
MCV: 92.2 fL (ref 80.0–100.0)
Monocytes Absolute: 0.5 10*3/uL (ref 0.1–1.0)
Monocytes Relative: 2 %
Neutro Abs: 20.9 10*3/uL — ABNORMAL HIGH (ref 1.7–7.7)
Neutrophils Relative %: 80 %
Platelets: 437 10*3/uL — ABNORMAL HIGH (ref 150–400)
RBC: 3.84 MIL/uL — ABNORMAL LOW (ref 4.22–5.81)
RDW: 13.9 % (ref 11.5–15.5)
WBC: 25.5 10*3/uL — ABNORMAL HIGH (ref 4.0–10.5)
nRBC: 0 % (ref 0.0–0.2)

## 2021-11-19 MED ORDER — PANTOPRAZOLE SODIUM 40 MG PO TBEC
40.0000 mg | DELAYED_RELEASE_TABLET | Freq: Every day | ORAL | Status: DC
  Administered 2021-11-20 – 2021-11-24 (×5): 40 mg via ORAL
  Filled 2021-11-19 (×5): qty 1

## 2021-11-19 MED ORDER — IRBESARTAN 300 MG PO TABS
300.0000 mg | ORAL_TABLET | Freq: Every day | ORAL | Status: DC
Start: 2021-11-19 — End: 2021-11-23
  Administered 2021-11-20 – 2021-11-22 (×3): 300 mg via ORAL
  Filled 2021-11-19 (×3): qty 1

## 2021-11-19 MED ORDER — OXYCODONE-ACETAMINOPHEN 10-325 MG PO TABS: 1.0000 | ORAL_TABLET | Freq: Four times a day (QID) | ORAL | Status: DC | PRN

## 2021-11-19 MED ORDER — OXYCODONE HCL 5 MG PO TABS: 5.0000 mg | ORAL_TABLET | Freq: Four times a day (QID) | ORAL | Status: DC | PRN

## 2021-11-19 MED ORDER — SODIUM CHLORIDE 0.9 % IV SOLN
100.0000 mg | INTRAVENOUS | Status: DC
  Administered 2021-11-20 – 2021-11-23 (×4): 100 mg via INTRAVENOUS
  Filled 2021-11-19 (×4): qty 100

## 2021-11-19 MED ORDER — AMLODIPINE BESYLATE 5 MG PO TABS
5.0000 mg | ORAL_TABLET | Freq: Every day | ORAL | Status: DC
Start: 2021-11-20 — End: 2021-11-19

## 2021-11-19 MED ORDER — OXYCODONE-ACETAMINOPHEN 5-325 MG PO TABS: 1.0000 | ORAL_TABLET | Freq: Four times a day (QID) | ORAL | Status: DC | PRN

## 2021-11-19 MED ORDER — AZITHROMYCIN 250 MG PO TABS
250.0000 mg | ORAL_TABLET | Freq: Every day | ORAL | Status: DC
  Administered 2021-11-20 – 2021-11-21 (×2): 250 mg via ORAL
  Filled 2021-11-19 (×2): qty 1

## 2021-11-19 MED ORDER — ALPRAZOLAM 0.5 MG PO TABS
0.5000 mg | ORAL_TABLET | Freq: Every day | ORAL | Status: DC
  Administered 2021-11-19 – 2021-11-24 (×8): 0.5 mg via ORAL
  Filled 2021-11-19 (×12): qty 1

## 2021-11-19 MED ORDER — SERTRALINE HCL 25 MG PO TABS
25.0000 mg | ORAL_TABLET | Freq: Every day | ORAL | Status: DC
  Administered 2021-11-20 – 2021-11-24 (×5): 25 mg via ORAL
  Filled 2021-11-19 (×5): qty 1

## 2021-11-19 MED ORDER — ACETAMINOPHEN 650 MG RE SUPP: 650.0000 mg | Freq: Four times a day (QID) | RECTAL | Status: DC | PRN

## 2021-11-19 MED ORDER — SODIUM CHLORIDE 0.9 % IV SOLN
200.0000 mg | Freq: Once | INTRAVENOUS | Status: AC
  Administered 2021-11-19: 200 mg via INTRAVENOUS
  Filled 2021-11-19: qty 200

## 2021-11-19 MED ORDER — INSULIN ASPART 100 UNIT/ML IJ SOLN: 0.0000 [IU] | Freq: Three times a day (TID) | INTRAMUSCULAR | Status: DC

## 2021-11-19 MED ORDER — POTASSIUM CHLORIDE CRYS ER 20 MEQ PO TBCR
20.0000 meq | EXTENDED_RELEASE_TABLET | Freq: Every evening | ORAL | Status: DC
  Administered 2021-11-19 – 2021-11-20 (×2): 20 meq via ORAL
  Filled 2021-11-19 (×2): qty 1

## 2021-11-19 MED ORDER — ACETAMINOPHEN 325 MG PO TABS: 650.0000 mg | ORAL_TABLET | Freq: Four times a day (QID) | ORAL | Status: DC | PRN

## 2021-11-19 MED ORDER — PROCHLORPERAZINE EDISYLATE 10 MG/2ML IJ SOLN: 5.0000 mg | INTRAMUSCULAR | Status: DC | PRN

## 2021-11-19 MED ORDER — HYOSCYAMINE SULFATE 0.125 MG SL SUBL
0.1250 mg | SUBLINGUAL_TABLET | SUBLINGUAL | Status: DC | PRN
  Filled 2021-11-19: qty 1

## 2021-11-19 MED ORDER — VALACYCLOVIR HCL 500 MG PO TABS
500.0000 mg | ORAL_TABLET | Freq: Every day | ORAL | Status: DC
  Administered 2021-11-20 – 2021-11-24 (×5): 500 mg via ORAL
  Filled 2021-11-19 (×5): qty 1

## 2021-11-19 MED ORDER — HYDROCHLOROTHIAZIDE 25 MG PO TABS
25.0000 mg | ORAL_TABLET | Freq: Every day | ORAL | Status: DC
  Administered 2021-11-20 – 2021-11-24 (×5): 25 mg via ORAL
  Filled 2021-11-19 (×5): qty 1

## 2021-11-19 MED ORDER — CEFDINIR 300 MG PO CAPS
300.0000 mg | ORAL_CAPSULE | Freq: Two times a day (BID) | ORAL | Status: AC
  Administered 2021-11-19 – 2021-11-21 (×5): 300 mg via ORAL
  Filled 2021-11-19 (×5): qty 1

## 2021-11-19 NOTE — Progress Notes (Signed)
Notified by lab that Bloodc Cx+ Candida Parapsilosis  would obtain blood Cx and start eraxis once pt arrives.  ?

## 2021-11-19 NOTE — Telephone Encounter (Signed)
Called pt back and spoke to pt and wife to come back to the ED as blood Cx + yeast from 3/6. They are planning to come in.  ?

## 2021-11-19 NOTE — ED Provider Triage Note (Signed)
Emergency Medicine Provider Triage Evaluation Note ? ?Brisco Boleyn , a 66 y.o. male  was evaluated in triage.  Pt complains of recent admission for pneumonia, improvement of symptoms, and discharge.  He was called for further evaluation due to positive blood cultures.  On evaluation blood cultures positive for Candida species.  My suspicion is that this is likely contaminant as patient overall feels great, and has no medical complaints at this time.  Spoke with Dr. Fredrik Rigger and we will consult infectious disease and await their recommendations for disposition. ? ?Review of Systems  ?Positive: - ?Negative: - ? ?Physical Exam  ?BP (!) 175/73 (BP Location: Right Arm)   Pulse 73   Temp 98.3 ?F (36.8 ?C) (Oral)   Resp 18   SpO2 94%  ?Gen:   Awake, no distress   ?Resp:  Normal effort  ?MSK:   Moves extremities without difficulty  ?Other:   ? ?Medical Decision Making  ?Medically screening exam initiated at 2:03 PM.  Appropriate orders placed.  Willia Seman was informed that the remainder of the evaluation will be completed by another provider, this initial triage assessment does not replace that evaluation, and the importance of remaining in the ED until their evaluation is complete. ? ?Workup initiated ?  ?Anselmo Pickler, PA-C ?11/19/21 1404 ? ?

## 2021-11-19 NOTE — H&P (Signed)
History and Physical    Patient: Kyle Velazquez [id number] DOB: [birth year] DOA: 11/19/2021 DOS: the patient was seen and examined on 11/19/2021 PCP: Reynold Bowen, MD  Patient coming from: Home  Chief Complaint: Abnormal result.  HPI: Kyle Velazquez is a 66 y.o. male with medical history significant of celiac disease, chronic back pain, chronic shoulder pain, diabetes 1.5, managed as type II, diabetic neuropathy, hyperlipidemia, hypertension, internal hemorrhoids who was sent by infectious diseases to be treated for Candida Parapsilosis fungemia after blood cultures resulted positive earlier today.  The patient stated that he is feeling much better from his pneumonia.  His appetite is better.  He slept well last night.  He still has some mild left-sided pleurisy.  He denied fever, chills, night sweats, malaise, rhinorrhea, sore throat, wheezing or hemoptysis.  No chest pain, palpitations, diaphoresis, PND, orthopnea, but has pitting edema of the lower extremities.  He stated that his lower extremity edema was better after amlodipine was decreased to 2.5 mg daily and valsartan increased to 320 mg p.o. daily.  Denied abdominal pain, nausea, emesis, diarrhea, constipation, melena or hematochezia.  No dysuria, flank pain, frequency or hematuria.  No polyuria polydipsia, polyphagia or blurred vision.   ED course: Initial vital signs were temperature 98.3 F, pulse 73, respiration 18, BP 175/73 mmHg and O2 sat 94% on room air.  The patient was started on anidulafungin per pharmacy dosing.  Lab work: He is urinalysis was normal. CBC showed a white count of 25.5 with 80% neutrophils, hemoglobin 11.9 g/dL platelets 437.  BMP showed a glucose of 176 mg/dL and a creatinine of 0.54 mg/dL.  The rest of the BMP measurements are unremarkable when sodium and chloride are corrected to glucose level.   Review of Systems: As mentioned in the history of present illness. All other systems reviewed and are  negative. Past Medical History:  Diagnosis Date   Celiac disease    Chronic back pain    Chronic shoulder pain    Diabetes 1.5, managed as type 2 ([place])    Diabetic neuropathy ([place])    Hyperlipidemia    Hypertension    Internal hemorrhoids    Past Surgical History:  Procedure Laterality Date   COLONOSCOPY  2007 2016   Dr Earlean Shawl    ESOPHAGOGASTRODUODENOSCOPY  2011   Dr Earlean Shawl    NECK SURGERY     SHOULDER SURGERY  2009   Social History:  reports that he has quit smoking. He has never used smokeless tobacco. He reports that he does not drink alcohol and does not use drugs.  No Known Allergies  Family History  Problem Relation Age of Onset   CAD Mother    CAD Father     Prior to Admission medications   Medication Sig Start Date End Date Taking? Authorizing Provider  ALPRAZolam (XANAX) 1 MG tablet TAKE 1/2 TABLET BY MOUTH FIVE TIMES DAILY Patient taking differently: Take 0.5 mg by mouth See admin instructions. TAKE 1/2 TABLET BY MOUTH FIVE TIMES DAILY 06/16/21   Cottle, Billey Co., MD  amLODipine (NORVASC) 5 MG tablet Take 1 tablet (5 mg total) by mouth daily. Patient taking differently: Take 2.5 mg by mouth daily. 11/10/21   Nahser, Wonda Cheng, MD  azithromycin (ZITHROMAX Z-PAK) 250 MG tablet Take 1 tablet (250 mg total) by mouth daily. Take 2 tablets (500 mg) on  Day 1,  followed by 1 tablet (250 mg) once daily on Days 2 through 5. 11/18/21   Danford, Suann Larry, MD  betamethasone dipropionate 0.05 % cream Apply 1 drop topically 2 (two) times daily as needed (skin inflammation). 10/30/19   [provider]  cefdinir (OMNICEF) 300 MG capsule Take 1 capsule (300 mg total) by mouth 2 (two) times daily. 11/18/21   Danford, Suann Larry, MD  Continuous Blood Gluc Sensor (FREESTYLE LIBRE 14 DAY SENSOR) MISC by Does not apply route.    [provider]  fluticasone (FLONASE) 50 MCG/ACT nasal spray Place 2 sprays into both nostrils at bedtime as needed for allergies or  rhinitis. 10/28/21   [provider]  glucose blood test strip 1 each by Other route as needed for other. Use as instructed    [provider]  hydrochlorothiazide (HYDRODIURIL) 25 MG tablet Take 1 tablet (25 mg total) by mouth daily. Patient needs appointment for any future refills.  Please call office at [phone] to schedule appointment. 1st attempt. 09/16/21   Nahser, Wonda Cheng, MD  hydrocortisone (ANUSOL-HC) 2.5 % rectal cream Apply 1 application. topically 2 (two) times daily as needed for anal itching. 10/17/21   [provider]  hydrocortisone 2.5 % cream Apply 1 application. topically 2 (two) times daily as needed (skin irritation). Apply topically 2 (two) times daily as needed. 06/15/21   [provider]  hyoscyamine (LEVSIN SL) 0.125 MG SL tablet Place 1 tablet (0.125 mg total) under the tongue every 4 (four) hours as needed. Patient taking differently: Place 0.125 mg under the tongue every 4 (four) hours as needed for cramping. 10/30/20   Nelida Meuse III, MD  insulin aspart (NOVOLOG) 100 UNIT/ML injection Inject into the skin 3 (three) times daily before meals. INJECT UP TO 70 UNITS DAILY ON A SLIDING SCALE    [provider]  ketoconazole (NIZORAL) 2 % cream Apply 1 application. topically in the morning and at bedtime. 10/06/21   [provider]  NOVOLOG 100 UNIT/ML injection Inject 0-70 Units into the skin daily. 11/06/21   [provider]  omeprazole (PRILOSEC) 40 MG capsule Take 40 mg by mouth daily. 12/15/19   [provider]  PERCOCET 10-325 MG tablet Take 1 tablet by mouth every 6 (six) hours as needed for pain. 02/23/16   [provider]  potassium chloride SA (KLOR-CON M) 20 MEQ tablet Take 1 tablet (20 mEq total) by mouth daily. Patient taking differently: Take 20 mEq by mouth every evening. 09/16/21   Nahser, Wonda Cheng, MD  sertraline (ZOLOFT) 25 MG tablet TAKE 1 TABLET(25 MG) BY MOUTH DAILY Patient taking  differently: Take 25 mg by mouth daily. 06/16/21   Cottle, Billey Co., MD  sildenafil (VIAGRA) 50 MG tablet Take 50 mg by mouth as needed for erectile dysfunction. 08/10/21   [provider]  Testosterone 20.25 MG/ACT (1.62%) GEL Apply 3 Pump topically daily. 12/24/19   [provider]  valACYclovir (VALTREX) 500 MG tablet Take 500 mg by mouth daily.    [provider]  valsartan (DIOVAN) 320 MG tablet Take 1 tablet (320 mg total) by mouth daily. 11/10/21   Nahser, Wonda Cheng, MD    Physical Exam: Vitals:   11/19/21 1548 11/19/21 1630 11/19/21 1733 11/19/21 1759  BP: (!) 163/66 112/86 135/65 135/65  Pulse: 78 66 66 66  Resp: 15 15 16 16   Temp:  98.2 F (36.8 C) 98.5 F (36.9 C) 98.5 F (36.9 C)  TempSrc:  Oral Oral Oral  SpO2: 99% 96% 100%   Weight:    63 kg  Height:  5\' 6"  (1.676 m)   Physical Exam Constitutional:      Appearance: Normal appearance. He is normal weight.  HENT:     Head: Normocephalic.     Mouth/Throat:     Mouth: Mucous membranes are moist.  Eyes:     Pupils: Pupils are equal, round, and reactive to light.  Neck:     Vascular: No JVD.  Cardiovascular:     Rate and Rhythm: Normal rate and regular rhythm.     Heart sounds: S1 normal and S2 normal.  Pulmonary:     Effort: Pulmonary effort is normal.     Breath sounds: Normal breath sounds.  Abdominal:     General: There is no distension.     Palpations: Abdomen is soft.     Tenderness: There is no abdominal tenderness.  Musculoskeletal:     Cervical back: Neck supple.     Right lower leg: 1+ Edema present.     Left lower leg: 1+ Edema present.  Skin:    General: Skin is warm and dry.  Neurological:     General: No focal deficit present.     Mental Status: He is alert and oriented to person, place, and time.  Psychiatric:        Mood and Affect: Mood normal.        Behavior: Behavior normal.   Data Reviewed:  There are no new results to review at this time.  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 Observation/MedSurg. Continue anidulafungin per pharmacy dosing. Follow-up blood culture and sensitivity. Dr. Candiss Norse input greatly appreciated. Follow CBC and chemistry in AM.  Active Problems:   CAP (community acquired pneumonia) Improved. Continue cefdinir 300 mg p.o. daily. Continue azithromycin 500 mg p.o. daily. Bronchodilators and supplemental oxygen as needed.    Controlled type 1 diabetes mellitus with hyperglycemia (HCC) Carbohydrate modified diet. CBG monitoring with RI SS.    HTN (hypertension) Discontinue amlodipine due to edema. Continue HCTZ 25 mg p.o. daily. Continue valsartan 320 mg daily or formulary equivalent. Add a non-CCB antihypertensive agent as needed.    GAD (generalized anxiety disorder) Continue sertraline 25 mg p.o. daily. Continue alprazolam as needed.    Diabetic neuropathy ([place]) Also has chronic pain from multiple sites. Continue Percocet 06/15/2024 every 6 hours as needed.    Hyperlipidemia Currently not on medical therapy. Follow-up with primary care provider.    Advance Care Planning:   Code Status: Full Code   Consults:   Family Communication:   Severity of Illness: The appropriate patient status for this patient is OBSERVATION. Observation status is judged to be reasonable and necessary in order to provide the required intensity of service to ensure the patient's safety. The patient's presenting symptoms, physical exam findings, and initial radiographic and laboratory data in the context of their medical condition is felt to place them at decreased risk for further clinical deterioration. Furthermore, it is anticipated that the patient will be medically stable for discharge from the hospital within 2 midnights of admission.   Author: Reubin Milan, MD 11/19/2021 6:44 PM  For on call review [email or web address].   This document was prepared using Therapist, occupational and may contain some unintended transcription errors.

## 2021-11-19 NOTE — ED Triage Notes (Signed)
Patient got a call that his blood cultures were positive, denies current complaints.  ?

## 2021-11-19 NOTE — Progress Notes (Signed)
Pharmacy Antibiotic Note ? ?Kyle Velazquez is a 66 y.o. male admitted on 11/19/2021 with  Candida parapsilosis candidemia  .  Pharmacy has been consulted for Eraxis dosing. Blood cultures were from 3/6 when patient previously admitted and patient was discharged on 3/8. Patient's blood cultures then became positive and patient was contacted today to come back to hospital for IV antifungal administration ? ?Plan: ?After repeat blood cultures have been drawn, start Eraxis 200mg  IV x 1 then 100mg  IV q24 thereafter ? ?  ? ?Temp (24hrs), Avg:98.3 ?F (36.8 ?C), Min:98.3 ?F (36.8 ?C), Max:98.3 ?F (36.8 ?C) ? ?Recent Labs  ?Lab 11/16/21 ?1300 11/16/21 ?1400 11/17/21 ?0606 11/18/21 ?[id number]  ?WBC 41.8*  --  42.6* 26.3*  ?CREATININE  --  0.80 0.66 0.59*  ?LATICACIDVEN 1.9  --   --   --   ?  ?Estimated Creatinine Clearance: 80 mL/min (A) (by C-G formula based on SCr of 0.59 mg/dL (L)).   ? ?No Known Allergies ? ? ? ?Thank you for allowing pharmacy to be a part of this patient?s care. ? ?Kara Mead ?11/19/2021 3:09 PM ? ?

## 2021-11-19 NOTE — ED Provider Notes (Signed)
[facility] DEPT Provider Note   CSN: [id number] Arrival date & time: 11/19/21  1340     History  No chief complaint on file.   Kyle Velazquez is a 66 y.o. male with a recent past medical history of admission to the hospital for sepsis, left lower lobar pneumonia, severe fever.  Patient reports that he received antibiotics, was feeling overall better, was discharged last night.  Patient was called back today due to positive blood cultures for Candida parapsilosis.  Patient denies significant ongoing difficulty breathing, fever, malaise.  At this time he denies any dysuria, chest pain, shortness of breath, reports he does still feel some overall weakness fevers since discharge.  He has been taking his discharge antibiotics including cefdinir, azithromycin as prescribed.  HPI     Home Medications Prior to Admission medications   Medication Sig Start Date End Date Taking? Authorizing Provider  ALPRAZolam (XANAX) 1 MG tablet TAKE 1/2 TABLET BY MOUTH FIVE TIMES DAILY Patient taking differently: Take 0.5 mg by mouth See admin instructions. TAKE 1/2 TABLET BY MOUTH FIVE TIMES DAILY 06/16/21   Cottle, Billey Co., MD  amLODipine (NORVASC) 5 MG tablet Take 1 tablet (5 mg total) by mouth daily. Patient taking differently: Take 2.5 mg by mouth daily. 11/10/21   Nahser, Wonda Cheng, MD  azithromycin (ZITHROMAX Z-PAK) 250 MG tablet Take 1 tablet (250 mg total) by mouth daily. Take 2 tablets (500 mg) on  Day 1,  followed by 1 tablet (250 mg) once daily on Days 2 through 5. 11/18/21   Danford, Suann Larry, MD  betamethasone dipropionate 0.05 % cream Apply 1 drop topically 2 (two) times daily as needed (skin inflammation). 10/30/19   [provider]  cefdinir (OMNICEF) 300 MG capsule Take 1 capsule (300 mg total) by mouth 2 (two) times daily. 11/18/21   Danford, Suann Larry, MD  Continuous Blood Gluc Sensor (FREESTYLE LIBRE 14 DAY SENSOR) MISC by Does not apply route.     [provider]  fluticasone (FLONASE) 50 MCG/ACT nasal spray Place 2 sprays into both nostrils at bedtime as needed for allergies or rhinitis. 10/28/21   [provider]  glucose blood test strip 1 each by Other route as needed for other. Use as instructed    [provider]  hydrochlorothiazide (HYDRODIURIL) 25 MG tablet Take 1 tablet (25 mg total) by mouth daily. Patient needs appointment for any future refills.  Please call office at [phone] to schedule appointment. 1st attempt. 09/16/21   Nahser, Wonda Cheng, MD  hydrocortisone (ANUSOL-HC) 2.5 % rectal cream Apply 1 application. topically 2 (two) times daily as needed for anal itching. 10/17/21   [provider]  hydrocortisone 2.5 % cream Apply 1 application. topically 2 (two) times daily as needed (skin irritation). Apply topically 2 (two) times daily as needed. 06/15/21   [provider]  hyoscyamine (LEVSIN SL) 0.125 MG SL tablet Place 1 tablet (0.125 mg total) under the tongue every 4 (four) hours as needed. Patient taking differently: Place 0.125 mg under the tongue every 4 (four) hours as needed for cramping. 10/30/20   Nelida Meuse III, MD  insulin aspart (NOVOLOG) 100 UNIT/ML injection Inject into the skin 3 (three) times daily before meals. INJECT UP TO 70 UNITS DAILY ON A SLIDING SCALE    [provider]  ketoconazole (NIZORAL) 2 % cream Apply 1 application. topically in the morning and at bedtime. 10/06/21   [provider]  NOVOLOG 100 UNIT/ML injection  Inject 0-70 Units into the skin daily. 11/06/21   [provider]  omeprazole (PRILOSEC) 40 MG capsule Take 40 mg by mouth daily. 12/15/19   [provider]  PERCOCET 10-325 MG tablet Take 1 tablet by mouth every 6 (six) hours as needed for pain. 02/23/16   [provider]  potassium chloride SA (KLOR-CON M) 20 MEQ tablet Take 1 tablet (20 mEq total) by mouth daily. Patient taking differently: Take 20 mEq  by mouth every evening. 09/16/21   Nahser, Wonda Cheng, MD  sertraline (ZOLOFT) 25 MG tablet TAKE 1 TABLET(25 MG) BY MOUTH DAILY Patient taking differently: Take 25 mg by mouth daily. 06/16/21   Cottle, Billey Co., MD  sildenafil (VIAGRA) 50 MG tablet Take 50 mg by mouth as needed for erectile dysfunction. 08/10/21   [provider]  Testosterone 20.25 MG/ACT (1.62%) GEL Apply 3 Pump topically daily. 12/24/19   [provider]  valACYclovir (VALTREX) 500 MG tablet Take 500 mg by mouth daily.    [provider]  valsartan (DIOVAN) 320 MG tablet Take 1 tablet (320 mg total) by mouth daily. 11/10/21   Nahser, Wonda Cheng, MD      Allergies    Patient has no known allergies.    Review of Systems   Review of Systems  Constitutional:  Positive for fatigue.  All other systems reviewed and are negative.  Physical Exam Updated Vital Signs BP (!) 163/66    Pulse 78    Temp 98.3 F (36.8 C) (Oral)    Resp 15    SpO2 99%  Physical Exam Vitals and nursing note reviewed.  Constitutional:      General: He is not in acute distress.    Appearance: Normal appearance. He is not ill-appearing.  HENT:     Head: Normocephalic and atraumatic.  Eyes:     General:        Right eye: No discharge.        Left eye: No discharge.  Cardiovascular:     Rate and Rhythm: Normal rate and regular rhythm.     Heart sounds: No murmur heard.   No friction rub. No gallop.  Pulmonary:     Effort: Pulmonary effort is normal.     Breath sounds: Normal breath sounds.     Comments: Patient does not have any significant accessory breath sounds, possible small consolidation noted in the left lower lung on auscultation.  No significant rhonchi, wheezes, stridor, rales. Abdominal:     General: Bowel sounds are normal.     Palpations: Abdomen is soft.  Skin:    General: Skin is warm and dry.     Capillary Refill: Capillary refill takes less than 2 seconds.  Neurological:     Mental Status: He is alert  and oriented to person, place, and time.  Psychiatric:        Mood and Affect: Mood normal.        Behavior: Behavior normal.    ED Results / Procedures / Treatments   Labs (all labs ordered are listed, but only abnormal results are displayed) Labs Reviewed  CBC WITH DIFFERENTIAL/PLATELET - Abnormal; Notable for the following components:      Result Value   WBC 25.5 (*)    RBC 3.84 (*)    Hemoglobin 11.9 (*)    HCT 35.4 (*)    Platelets 437 (*)    Neutro Abs 20.9 (*)    All other components within normal limits  BASIC  METABOLIC PANEL - Abnormal; Notable for the following components:   Sodium 134 (*)    Chloride 96 (*)    Glucose, Bld 176 (*)    Creatinine, Ser 0.54 (*)    All other components within normal limits  CULTURE, BLOOD (ROUTINE X 2)  CULTURE, BLOOD (ROUTINE X 2)  RESP PANEL BY RT-PCR (FLU A&B, COVID) ARPGX2  URINALYSIS, ROUTINE W REFLEX MICROSCOPIC    EKG None  Radiology VAS [place] ABI WITH/WO TBI  Result Date: 11/18/2021  LOWER EXTREMITY DOPPLER STUDY Patient Name:  CLEARNCE CAMISA  Date of Exam:   11/18/2021 Medical Rec #: [id number]        Accession #:    [id number] Date of Birth: [birth year]         Patient Gender: M Patient Age:   45 years Exam [facility]  [facility] Procedure:      VAS [place] ABI WITH/WO TBI Referring Phys: Harrell Gave DANFORD --------------------------------------------------------------------------------  Indications: High BPM score. High Risk Factors: Hypertension, Diabetes.  Comparison Study: No prior studies. Performing Technologist: Carlos Levering RVT  Examination Guidelines: A complete evaluation includes at minimum, Doppler waveform signals and systolic blood pressure reading at the level of bilateral brachial, anterior tibial, and posterior tibial arteries, when vessel segments are accessible. Bilateral testing is considered an integral part of a complete examination. Photoelectric Plethysmograph (PPG) waveforms and toe systolic pressure  readings are included as required and additional duplex testing as needed. Limited examinations for reoccurring indications may be performed as noted.  ABI Findings: +---------+------------------+-----+---------+--------+  Right     Rt Pressure (mmHg) Index Waveform  Comment   +---------+------------------+-----+---------+--------+  Brachial  156                      triphasic           +---------+------------------+-----+---------+--------+  PTA       179                1.15  triphasic           +---------+------------------+-----+---------+--------+  DP        171                1.10  triphasic           +---------+------------------+-----+---------+--------+  Great Toe 112                0.72                      +---------+------------------+-----+---------+--------+ +---------+------------------+-----+---------+-------+  Left      Lt Pressure (mmHg) Index Waveform  Comment  +---------+------------------+-----+---------+-------+  Brachial  144                      triphasic          +---------+------------------+-----+---------+-------+  PTA       179                1.15  triphasic          +---------+------------------+-----+---------+-------+  DP        167                1.07  triphasic          +---------+------------------+-----+---------+-------+  Great Toe 94                 0.60                     +---------+------------------+-----+---------+-------+ +-------+-----------+-----------+------------+------------+  ABI/TBI Today's ABI Today's TBI Previous ABI Previous TBI  +-------+-----------+-----------+------------+------------+  Right   1.15        0.72                                   +-------+-----------+-----------+------------+------------+  Left    1.15        0.6                                    +-------+-----------+-----------+------------+------------+  Summary: Right: Resting right ankle-brachial index is within normal range. No evidence of significant right lower extremity arterial disease.  The right toe-brachial index is normal. Left: Resting left ankle-brachial index is within normal range. No evidence of significant left lower extremity arterial disease. The left toe-brachial index is abnormal.  *See table(s) above for measurements and observations.  Electronically signed by Monica Martinez MD on 11/18/2021 at 1:54:04 PM.    Final     Procedures Procedures    Medications Ordered in ED Medications  anidulafungin (ERAXIS) 200 mg in sodium chloride 0.9 % 200 mL IVPB (has no administration in time range)    Followed by  anidulafungin (ERAXIS) 100 mg in sodium chloride 0.9 % 100 mL IVPB (has no administration in time range)    ED Course/ Medical Decision Making/ A&P Clinical Course as of 11/19/21 1640  Thu Nov 19, 2021  1420 Eraxis antimicrobial for yeast was suggested by Dr. Candiss Norse in addition to repeat blood cultures, basic bloodwork, UA. Reported pharm consult for Eraxis dosing. [CP]  1616 We will consult for admission at this time.  Patient understands and agrees to this plan he continues to feel overall improved from his recent hospitalization but understands the need to come in for additional treatment for fungemia. [CP]  [id number] Spoke with Dr. Olevia Bowens who agrees to admission at this time. [CP]    Clinical Course User Index [CP] Anselmo Pickler, PA-C                           Medical Decision Making Amount and/or Complexity of Data Reviewed Labs: ordered.   I discussed this case with my attending physician who cosigned this note including patient's presenting symptoms, physical exam, and planned diagnostics and interventions. Attending physician stated agreement with plan or made changes to plan which were implemented.   Attending physician assessed patient at bedside.  Spoke with Dr. Candiss Norse with infectious disease who does believe that this positive blood culture represents a true fungemia, and recommends Eraxis for antifungal treatment.  Request admission at this  time for management of IV antifungals.  Spoke with pharmacy who will help with dosing.  Patient still with a fairly profound leukocytosis, white blood cells 25.5, with neutrophil predominance, also with elevated platelets of 437, thought to be an acute phase reactant.  His BMP is overall unremarkable with mild hyponatremia sodium 134, slightly elevated glucose at 136.  Urinalysis is unremarkable.  We do not repeat imaging at this time as patient had recent known left lower lobar pneumonia, expect that he still has some infiltrate on plain film radiograph.  After consideration of patient's recent admission, and discussion with consultants do believe that patient meets criteria for admission at this time.  At this time we will consult for admission.  Spoke with Dr. Olevia Bowens who  agrees to admission at this time.  Patient informed of plan.  He continues to remain with stable vital signs other than some hypertension systolic of [id number], uncomfortable on my evaluation.  He has repeat blood cultures and RVP pending at this time. Final Clinical Impression(s) / ED Diagnoses Final diagnoses:  Candidemia [facility])    Rx / DC Orders ED Discharge Orders     None         Dorien Chihuahua 11/19/21 1640    Teressa Lower, MD 11/19/21 1712

## 2021-11-20 ENCOUNTER — Observation Stay (HOSPITAL_COMMUNITY): Payer: Medicare Other

## 2021-11-20 DIAGNOSIS — B49 Unspecified mycosis: Secondary | ICD-10-CM | POA: Diagnosis present

## 2021-11-20 DIAGNOSIS — E1149 Type 2 diabetes mellitus with other diabetic neurological complication: Secondary | ICD-10-CM

## 2021-11-20 DIAGNOSIS — J189 Pneumonia, unspecified organism: Secondary | ICD-10-CM | POA: Diagnosis present

## 2021-11-20 DIAGNOSIS — E871 Hypo-osmolality and hyponatremia: Secondary | ICD-10-CM | POA: Diagnosis present

## 2021-11-20 DIAGNOSIS — E113291 Type 2 diabetes mellitus with mild nonproliferative diabetic retinopathy without macular edema, right eye: Secondary | ICD-10-CM | POA: Diagnosis present

## 2021-11-20 DIAGNOSIS — E119 Type 2 diabetes mellitus without complications: Secondary | ICD-10-CM | POA: Diagnosis not present

## 2021-11-20 DIAGNOSIS — D638 Anemia in other chronic diseases classified elsewhere: Secondary | ICD-10-CM | POA: Diagnosis present

## 2021-11-20 DIAGNOSIS — E1140 Type 2 diabetes mellitus with diabetic neuropathy, unspecified: Secondary | ICD-10-CM | POA: Diagnosis present

## 2021-11-20 DIAGNOSIS — I38 Endocarditis, valve unspecified: Secondary | ICD-10-CM

## 2021-11-20 DIAGNOSIS — D75839 Thrombocytosis, unspecified: Secondary | ICD-10-CM | POA: Diagnosis present

## 2021-11-20 DIAGNOSIS — F411 Generalized anxiety disorder: Secondary | ICD-10-CM | POA: Diagnosis present

## 2021-11-20 DIAGNOSIS — D72829 Elevated white blood cell count, unspecified: Secondary | ICD-10-CM

## 2021-11-20 DIAGNOSIS — R7989 Other specified abnormal findings of blood chemistry: Secondary | ICD-10-CM | POA: Diagnosis not present

## 2021-11-20 DIAGNOSIS — K900 Celiac disease: Secondary | ICD-10-CM | POA: Diagnosis present

## 2021-11-20 DIAGNOSIS — Z794 Long term (current) use of insulin: Secondary | ICD-10-CM | POA: Diagnosis not present

## 2021-11-20 DIAGNOSIS — D649 Anemia, unspecified: Secondary | ICD-10-CM | POA: Diagnosis not present

## 2021-11-20 DIAGNOSIS — Z9641 Presence of insulin pump (external) (internal): Secondary | ICD-10-CM | POA: Diagnosis present

## 2021-11-20 DIAGNOSIS — E785 Hyperlipidemia, unspecified: Secondary | ICD-10-CM | POA: Diagnosis present

## 2021-11-20 DIAGNOSIS — I10 Essential (primary) hypertension: Secondary | ICD-10-CM | POA: Diagnosis present

## 2021-11-20 DIAGNOSIS — Z20822 Contact with and (suspected) exposure to covid-19: Secondary | ICD-10-CM | POA: Diagnosis present

## 2021-11-20 DIAGNOSIS — E1165 Type 2 diabetes mellitus with hyperglycemia: Secondary | ICD-10-CM

## 2021-11-20 DIAGNOSIS — B3789 Other sites of candidiasis: Secondary | ICD-10-CM | POA: Diagnosis present

## 2021-11-20 DIAGNOSIS — R7881 Bacteremia: Secondary | ICD-10-CM | POA: Diagnosis not present

## 2021-11-20 DIAGNOSIS — G8929 Other chronic pain: Secondary | ICD-10-CM | POA: Diagnosis present

## 2021-11-20 DIAGNOSIS — K219 Gastro-esophageal reflux disease without esophagitis: Secondary | ICD-10-CM | POA: Diagnosis present

## 2021-11-20 DIAGNOSIS — Z8249 Family history of ischemic heart disease and other diseases of the circulatory system: Secondary | ICD-10-CM | POA: Diagnosis not present

## 2021-11-20 DIAGNOSIS — Z79899 Other long term (current) drug therapy: Secondary | ICD-10-CM | POA: Diagnosis not present

## 2021-11-20 DIAGNOSIS — E875 Hyperkalemia: Secondary | ICD-10-CM | POA: Diagnosis present

## 2021-11-20 DIAGNOSIS — Z87891 Personal history of nicotine dependence: Secondary | ICD-10-CM | POA: Diagnosis not present

## 2021-11-20 LAB — GLUCOSE, CAPILLARY
Glucose-Capillary: 150 mg/dL — ABNORMAL HIGH (ref 70–99)
Glucose-Capillary: 223 mg/dL — ABNORMAL HIGH (ref 70–99)
Glucose-Capillary: 195 mg/dL — ABNORMAL HIGH (ref 70–99)
Glucose-Capillary: 118 mg/dL — ABNORMAL HIGH (ref 70–99)

## 2021-11-20 LAB — ECHOCARDIOGRAM COMPLETE
AR max vel: 4.35 cm2
AV Area VTI: 4.19 cm2
AV Area mean vel: 3.9 cm2
AV Mean grad: 8 mmHg
AV Peak grad: 14.3 mmHg
Ao pk vel: 1.89 m/s
Area-P 1/2: 3.6 cm2
Calc EF: 74.3 %
MV M vel: 5.1 m/s
MV Peak grad: 104 mmHg
MV VTI: 4.47 cm2
S' Lateral: 3 cm
Single Plane A2C EF: 75.2 %
Single Plane A4C EF: 71.9 %

## 2021-11-20 LAB — CBC
HCT: 36 % — ABNORMAL LOW (ref 39.0–52.0)
Hemoglobin: 11.8 g/dL — ABNORMAL LOW (ref 13.0–17.0)
MCH: 30.9 pg (ref 26.0–34.0)
MCHC: 32.8 g/dL (ref 30.0–36.0)
MCV: 94.2 fL (ref 80.0–100.0)
Platelets: 398 10*3/uL (ref 150–400)
RBC: 3.82 MIL/uL — ABNORMAL LOW (ref 4.22–5.81)
RDW: 14.1 % (ref 11.5–15.5)
WBC: 20.1 10*3/uL — ABNORMAL HIGH (ref 4.0–10.5)
nRBC: 0 % (ref 0.0–0.2)

## 2021-11-20 LAB — COMPREHENSIVE METABOLIC PANEL
ALT: 59 U/L — ABNORMAL HIGH (ref 0–44)
AST: 65 U/L — ABNORMAL HIGH (ref 15–41)
Albumin: 2.5 g/dL — ABNORMAL LOW (ref 3.5–5.0)
Alkaline Phosphatase: 210 U/L — ABNORMAL HIGH (ref 38–126)
Anion gap: 9 (ref 5–15)
BUN: 9 mg/dL (ref 8–23)
CO2: 28 mmol/L (ref 22–32)
Calcium: 9.3 mg/dL (ref 8.9–10.3)
Chloride: 97 mmol/L — ABNORMAL LOW (ref 98–111)
Creatinine, Ser: 0.61 mg/dL (ref 0.61–1.24)
GFR, Estimated: >60 mL/min (ref >60)
Glucose, Bld: 165 mg/dL — ABNORMAL HIGH (ref 70–99)
Potassium: 4.7 mmol/L (ref 3.5–5.1)
Sodium: 134 mmol/L — ABNORMAL LOW (ref 135–145)
Total Bilirubin: 0.6 mg/dL (ref 0.3–1.2)
Total Protein: 7.1 g/dL (ref 6.5–8.1)

## 2021-11-20 MED ORDER — LOPERAMIDE HCL 2 MG PO CAPS
2.0000 mg | ORAL_CAPSULE | Freq: Once | ORAL | Status: AC
  Administered 2021-11-20: 2 mg via ORAL
  Filled 2021-11-20: qty 1

## 2021-11-20 MED ORDER — LOPERAMIDE HCL 2 MG PO CAPS
2.0000 mg | ORAL_CAPSULE | Freq: Three times a day (TID) | ORAL | Status: DC | PRN
  Administered 2021-11-20 – 2021-11-23 (×4): 2 mg via ORAL
  Filled 2021-11-20 (×4): qty 1

## 2021-11-20 NOTE — Assessment & Plan Note (Signed)
-   Continue sertraline.  Continue Xanax as needed.  Outpatient follow-up with PCP. ?

## 2021-11-20 NOTE — Assessment & Plan Note (Signed)
-   Monitor blood pressure.  Continue hydrochlorothiazide and irbesartan. ?

## 2021-11-20 NOTE — Assessment & Plan Note (Signed)
-   Not on any medications as an outpatient.  Outpatient follow-up with PCP. ?

## 2021-11-20 NOTE — Assessment & Plan Note (Signed)
-   Patient was recently hospitalized and discharged for pneumonia on oral antibiotics.  Continue and complete course of antibiotic therapy ?

## 2021-11-20 NOTE — Consult Note (Addendum)
?   ? ? ? ? ?[facility]   ? ?Date of Admission:  11/19/2021   Total days of inpatient antibiotics 1 ? ?      ?Reason for Consult: Fungemia    ?Principal Problem: ?  Fungemia ?Active Problems: ?  Diabetic neuropathy ([place]) ?  Hyperlipidemia ?  HTN (hypertension) ?  Controlled type 1 diabetes mellitus with hyperglycemia ([place]) ?  GAD (generalized anxiety disorder) ?  CAP (community acquired pneumonia) ? ? ?Assessment: ?51 YM with DM, discharged day prior to admission for  pneumonia on cefdinir and azithromycin. Called back to the ED as blood Cx grew candida parapsilosis. ? ?#Candida parapsilosis 2/2 unclear etiology  ?#DMI(A1C 6.8) on 11/17/21 ?#Recent admission for community acquired pneumonia ?-CT on 3/6 showed dense consolidation of LLL  ?-Followed by ENT excessive mucous secretion ?-Hx of  possible rectal yeast infection (topical/suppository) presents as pruritis. His followed by Endocrinology, Dr  Roque Cash ?-Etiology of fungemia is unclear. I think sinusitis could be possible as it is an ongoing issue. CT head showed mild paranasal sinus disease albeit improved from February, 2022 imaging.  Secondly, rectal yeast infection is less likely to lead to fungemia as pt did not ante any worsening symptoms. Urine Cx form 11/16/21 +strep viridans and he has not urinary symptoms.  ?Recommendations:  ?-Start Eraxis ?-Complete course of  cefdinir and azithromycin for CAP(EOT 3/11) ?-TTE ?-Repeat blood Cx ?-Follow repeat blood Cx on admission(of note Cx from 3/9 are prior to starting anti-fungals) ?- Ophthalmology consult ?-Consider ENT consult as CT head had showed sinusitis, they may be able to obtain Cx in case it an invasive fungal infection ? ?Microbiology:   ?Antibiotics: ?Eraxis ?course of azithromycin and cefdinir ?Cultures: ?Blood ?3/6 candida parapsilosis ?3/10 pending ? ? ?HPI: Shanna Rugar is a 66 y.o. male hypertension, hyperlipidemia, anxiety, celiac disease, current headache, type 1  diabetes admitted due to blood cultures being positive for Candida parapsilosis.  Patient was discharged day prior to admission for hospitalization for left lower lobe pneumonia.  During admission had presented with WBC 41 K, with URI symptoms of fever runny nose and cough.  Treated with ceftriaxone and azithromycin and transition to cefdinir and azithromycin on discharge.  Called back today to positive blood cultures.  On arrival to the ED, vitals stable, WBC 20 K.  He was started on Eraxis and ID engaged. ? ? ?Review of Systems: ?Review of Systems  ?All other systems reviewed and are negative. ? ?Past Medical History:  ?Diagnosis Date  ? Celiac disease   ? Chronic back pain   ? Chronic shoulder pain   ? Diabetes 1.5, managed as type 2 ([place])   ? Diabetic neuropathy ([place])   ? Hyperlipidemia   ? Hypertension   ? Internal hemorrhoids   ? ? ?Social History  ? ?Tobacco Use  ? Smoking status: Former  ? Smokeless tobacco: Never  ?Substance Use Topics  ? Alcohol use: No  ? Drug use: No  ? ? ?Family History  ?Problem Relation Age of Onset  ? CAD Mother   ? CAD Father   ? ?Scheduled Meds: ? ALPRAZolam  0.5 mg Oral 5 X Daily  ? azithromycin  250 mg Oral Daily  ? cefdinir  300 mg Oral BID  ? hydrochlorothiazide  25 mg Oral Daily  ? insulin aspart  0-15 Units Subcutaneous TID WC  ? irbesartan  300 mg Oral Daily  ? pantoprazole  40 mg Oral Daily  ? potassium chloride  SA  20 mEq Oral QPM  ? sertraline  25 mg Oral Daily  ? valACYclovir  500 mg Oral Daily  ? ?Continuous Infusions: ? anidulafungin    ? ?PRN Meds:.acetaminophen **OR** acetaminophen, hyoscyamine, oxyCODONE-acetaminophen **AND** oxyCODONE, prochlorperazine ?No Known Allergies ? ?OBJECTIVE: ?Blood pressure (!) 161/77, pulse 61, temperature (!) 97.5 ?F (36.4 ?C), temperature source Oral, resp. rate 18, height 5\' 6"  (1.676 m), weight 63 kg, SpO2 96 %. ? ?Physical Exam ?Constitutional:   ?   General: He is not in acute distress. ?   Appearance: He is normal weight. He is  not toxic-appearing.  ?HENT:  ?   Head: Normocephalic and atraumatic.  ?   Right Ear: External ear normal.  ?   Left Ear: External ear normal.  ?   Nose: No congestion or rhinorrhea.  ?   Mouth/Throat:  ?   Mouth: Mucous membranes are moist.  ?   Pharynx: Oropharynx is clear.  ?Eyes:  ?   Extraocular Movements: Extraocular movements intact.  ?   Conjunctiva/sclera: Conjunctivae normal.  ?   Pupils: Pupils are equal, round, and reactive to light.  ?Cardiovascular:  ?   Rate and Rhythm: Normal rate and regular rhythm.  ?   Heart sounds: No murmur heard. ?  No friction rub. No gallop.  ?Pulmonary:  ?   Effort: Pulmonary effort is normal.  ?   Breath sounds: Normal breath sounds.  ?Abdominal:  ?   General: Abdomen is flat. Bowel sounds are normal.  ?   Palpations: Abdomen is soft.  ?Musculoskeletal:     ?   General: No swelling. Normal range of motion.  ?   Cervical back: Normal range of motion and neck supple.  ?Skin: ?   General: Skin is warm and dry.  ?Neurological:  ?   General: No focal deficit present.  ?   Mental Status: He is oriented to person, place, and time.  ?Psychiatric:     ?   Mood and Affect: Mood normal.  ? ? ?Lab Results ?Lab Results  ?Component Value Date  ? WBC 20.1 (H) 11/20/2021  ? HGB 11.8 (L) 11/20/2021  ? HCT 36.0 (L) 11/20/2021  ? MCV 94.2 11/20/2021  ? PLT 398 11/20/2021  ?  ?Lab Results  ?Component Value Date  ? CREATININE 0.61 11/20/2021  ? BUN 9 11/20/2021  ? NA 134 (L) 11/20/2021  ? K 4.7 11/20/2021  ? CL 97 (L) 11/20/2021  ? CO2 28 11/20/2021  ?  ?Lab Results  ?Component Value Date  ? ALT 59 (H) 11/20/2021  ? AST 65 (H) 11/20/2021  ? GGT 98 (H) 11/17/2021  ? ALKPHOS 210 (H) 11/20/2021  ? BILITOT 0.6 11/20/2021  ?  ? ? ? ?Laurice Record, MD ?[facility] ?[facility] ?11/20/2021, 7:05 AM  ?

## 2021-11-20 NOTE — Assessment & Plan Note (Signed)
-   Continue current regimen.  Outpatient follow-up. ?

## 2021-11-20 NOTE — Progress Notes (Signed)
?  Progress Note ? ? ?Patient: Kyle Velazquez [id number] DOB: [birth year] DOA: 11/19/2021     0 ?DOS: the patient was seen and examined on 11/20/2021 ?  ?Brief hospital course: ?66 y.o. male with medical history significant of celiac disease, chronic back pain, chronic shoulder pain, diabetes mellitus type 2 with neuropathy, hyperlipidemia, hypertension, recent admission from 11/16/2021-11/18/2021 for pneumonia treated with IV antibiotics and discharged home on oral antibiotics was admitted on 11/19/2021 after his blood cultures from prior admission grew yeast and ID recommended hospitalization. ? ?Assessment and Plan: ?* Fungemia ?- Questionable cause.  Blood cultures from recent prior hospitalization grew yeast/Candida parapsilosis: Follow sensitivities. ?-Continue anidulafungin ?-Follow repeat blood cultures.  Follow ID recommendations. ?-Currently afebrile and hemodynamically stable ? ?CAP (community acquired pneumonia) ?- Patient was recently hospitalized and discharged for pneumonia on oral antibiotics.  Continue and complete course of antibiotic therapy ? ?HTN (hypertension) ?- Monitor blood pressure.  Continue hydrochlorothiazide and irbesartan. ? ?Type 2 diabetes mellitus with hyperglycemia ([place]) ?- Carb modified diet.  Continue CBGs with SSI. ? ?Diabetic neuropathy ([place]) ?- Continue current regimen.  Outpatient follow-up. ? ?Hyperlipidemia ?- Not on any medications as an outpatient.  Outpatient follow-up with PCP. ? ?GAD (generalized anxiety disorder) ?- Continue sertraline.  Continue Xanax as needed.  Outpatient follow-up with PCP. ? ? ? ? ?  ? ?Subjective:  ?Patient seen and examined at bedside.  Denies worsening shortness of breath, nausea, vomiting, fever or chest pain. ? ?Physical Exam: ?Vitals:  ? 11/19/21 1759 11/19/21 2030 11/20/21 0035 11/20/21 0430  ?BP: 135/65 (!) 164/75 (!) 157/74 (!) 161/77  ?Pulse: 66 62 (!) 59 61  ?Resp: 16 17 18 18   ?Temp: 98.5 ?F (36.9 ?C) 98.6 ?F (37 ?C) 98.4 ?F (36.9 ?C) (!) 97.5  ?F (36.4 ?C)  ?TempSrc: Oral Oral Oral Oral  ?SpO2:  95% 95% 96%  ?Weight: 63 kg     ?Height: 5\' 6"  (1.676 m)     ? ?General: No acute distress, currently on room air ?ENT/neck: No elevated JVD.  No obvious masses  ?respiratory: Bilateral decreased breath sounds at bases with some scattered crackles ?CVS: S1-S2 heard, rate controlled ?Abdominal: Soft, nontender, nondistended, no organomegaly, bowel sounds heard ?Extremities: No cyanosis, clubbing, edema ?CNS: Alert, awake and oriented.  No focal neurologic deficit.  Moving extremities. ?Lymph: No cervical lymphadenopathy ?Skin: No rashes, lesions, ulcers ?Psych: Normal mood, affect and judgment ?Musculoskeletal: No obvious joint deformity/tenderness/swelling ? ?Data Reviewed: ?I have reviewed patient's investigations during this hospitalization and recent hospitalization myself.  Today, sodium 134, BUN 9, creatinine 0.61, alkaline phosphatase 210, AST 65, ALT 59, WBC 20.1, hemoglobin 11.8.  Repeat blood cultures from 11/19/2021 are negative so far. ? ?Family Communication: None at bedside ? ?Disposition: ?Status is: Observation ?The patient will require care spanning > 2 midnights and should be moved to inpatient because: Of need for IV antifungals ? Planned Discharge Destination: Home ? ? ? ?Author: ?Aline August, MD ?11/20/2021 10:22 AM ? ?For on call review [email or web address].  ?

## 2021-11-20 NOTE — Assessment & Plan Note (Addendum)
-   Questionable cause.  Blood cultures from recent prior hospitalization grew yeast/Candida parapsilosis: Follow sensitivities. ?-Continue anidulafungin ?-Follow repeat blood cultures.  ID following.  Echo showed EF of 70 to 75% with grade 2 diastolic dysfunction and vegetation could not be excluded and recommended TEE.  Will involve cardiology to arrange TEE. ?-Currently afebrile and hemodynamically stable ?

## 2021-11-20 NOTE — Plan of Care (Signed)
  Problem: Education: Goal: Knowledge of General Education information will improve Description Including pain rating scale, medication(s)/side effects and non-pharmacologic comfort measures Outcome: Progressing   Problem: Health Behavior/Discharge Planning: Goal: Ability to manage health-related needs will improve Outcome: Progressing   

## 2021-11-20 NOTE — Hospital Course (Addendum)
66 y.o. male with medical history significant of celiac disease, chronic back pain, chronic shoulder pain, diabetes mellitus type 2 with neuropathy, hyperlipidemia, hypertension, recent admission from 11/16/2021-11/18/2021 for pneumonia treated with IV antibiotics and discharged home on oral antibiotics was admitted on 11/19/2021 after his blood cultures from prior admission grew yeast and ID recommended hospitalization.  He was started on IV antifungal. ?

## 2021-11-20 NOTE — Assessment & Plan Note (Signed)
-   Carb modified diet.  Continue CBGs with SSI. ?

## 2021-11-21 ENCOUNTER — Encounter (HOSPITAL_COMMUNITY): Payer: Self-pay | Admitting: Internal Medicine

## 2021-11-21 ENCOUNTER — Inpatient Hospital Stay (HOSPITAL_COMMUNITY): Payer: Medicare Other

## 2021-11-21 DIAGNOSIS — R7989 Other specified abnormal findings of blood chemistry: Secondary | ICD-10-CM

## 2021-11-21 DIAGNOSIS — B49 Unspecified mycosis: Secondary | ICD-10-CM | POA: Diagnosis not present

## 2021-11-21 DIAGNOSIS — D72829 Elevated white blood cell count, unspecified: Secondary | ICD-10-CM

## 2021-11-21 LAB — COMPREHENSIVE METABOLIC PANEL
ALT: 54 U/L — ABNORMAL HIGH (ref 0–44)
AST: 41 U/L (ref 15–41)
Albumin: 2.4 g/dL — ABNORMAL LOW (ref 3.5–5.0)
Alkaline Phosphatase: 192 U/L — ABNORMAL HIGH (ref 38–126)
Anion gap: 7 (ref 5–15)
BUN: 12 mg/dL (ref 8–23)
CO2: 30 mmol/L (ref 22–32)
Calcium: 9.3 mg/dL (ref 8.9–10.3)
Chloride: 94 mmol/L — ABNORMAL LOW (ref 98–111)
Creatinine, Ser: 0.62 mg/dL (ref 0.61–1.24)
GFR, Estimated: >60 mL/min (ref >60)
Glucose, Bld: 296 mg/dL — ABNORMAL HIGH (ref 70–99)
Potassium: 5.1 mmol/L (ref 3.5–5.1)
Sodium: 131 mmol/L — ABNORMAL LOW (ref 135–145)
Total Bilirubin: 0.4 mg/dL (ref 0.3–1.2)
Total Protein: 7.1 g/dL (ref 6.5–8.1)

## 2021-11-21 LAB — CBC WITH DIFFERENTIAL/PLATELET
Abs Immature Granulocytes: 1.11 10*3/uL — ABNORMAL HIGH (ref 0.00–0.07)
Basophils Absolute: 0.1 10*3/uL (ref 0.0–0.1)
Basophils Relative: 1 %
Eosinophils Absolute: 0.4 10*3/uL (ref 0.0–0.5)
Eosinophils Relative: 2 %
HCT: 35.6 % — ABNORMAL LOW (ref 39.0–52.0)
Hemoglobin: 11.6 g/dL — ABNORMAL LOW (ref 13.0–17.0)
Immature Granulocytes: 6 %
Lymphocytes Relative: 9 %
Lymphs Abs: 1.6 10*3/uL (ref 0.7–4.0)
MCH: 30.8 pg (ref 26.0–34.0)
MCHC: 32.6 g/dL (ref 30.0–36.0)
MCV: 94.4 fL (ref 80.0–100.0)
Monocytes Absolute: 1 10*3/uL (ref 0.1–1.0)
Monocytes Relative: 6 %
Neutro Abs: 13.6 10*3/uL — ABNORMAL HIGH (ref 1.7–7.7)
Neutrophils Relative %: 76 %
Platelets: 425 10*3/uL — ABNORMAL HIGH (ref 150–400)
RBC: 3.77 MIL/uL — ABNORMAL LOW (ref 4.22–5.81)
RDW: 14.1 % (ref 11.5–15.5)
WBC: 17.8 10*3/uL — ABNORMAL HIGH (ref 4.0–10.5)
nRBC: 0 % (ref 0.0–0.2)

## 2021-11-21 LAB — GLUCOSE, CAPILLARY
Glucose-Capillary: 240 mg/dL — ABNORMAL HIGH (ref 70–99)
Glucose-Capillary: 197 mg/dL — ABNORMAL HIGH (ref 70–99)
Glucose-Capillary: 133 mg/dL — ABNORMAL HIGH (ref 70–99)
Glucose-Capillary: 102 mg/dL — ABNORMAL HIGH (ref 70–99)

## 2021-11-21 LAB — CULTURE, BLOOD (ROUTINE X 2)
Culture: NO GROWTH
Special Requests: ADEQUATE

## 2021-11-21 LAB — MAGNESIUM: Magnesium: 1.7 mg/dL (ref 1.7–2.4)

## 2021-11-21 MED ORDER — IOHEXOL 300 MG/ML  SOLN
75.0000 mL | Freq: Once | INTRAMUSCULAR | Status: AC | PRN
  Administered 2021-11-21: 75 mL via INTRAVENOUS

## 2021-11-21 MED ORDER — INSULIN PUMP: Freq: Three times a day (TID) | SUBCUTANEOUS | Status: DC

## 2021-11-21 MED ORDER — INSULIN PUMP
Freq: Three times a day (TID) | SUBCUTANEOUS | Status: DC
Start: 2021-11-21 — End: 2021-11-24
  Filled 2021-11-21: qty 1

## 2021-11-21 NOTE — Progress Notes (Addendum)
[facility]   Reason for visit: Follow up on Candidemia  Interval History: repeat blood cultures 3/9 and 3/10 ngtd; 3/6 blood cultures with C parapsilosis in 1 set, both bottles.  TTE with possible vegetation on TV    Physical Exam: Constitutional:  Vitals:   11/21/21 0532 11/21/21 1347  BP: (!) 157/81 (!) 177/68  Pulse: 66 (!) 56  Resp: 17 18  Temp: 97.7 F (36.5 C) 97.9 F (36.6 C)  SpO2: 95% 98%   patient appears in NAD Respiratory: Normal respiratory effort; CTA B Cardiovascular: RRR  Review of Systems: Constitutional: negative for fevers and chills  Lab Results  Component Value Date   WBC 17.8 (H) 11/21/2021   HGB 11.6 (L) 11/21/2021   HCT 35.6 (L) 11/21/2021   MCV 94.4 11/21/2021   PLT 425 (H) 11/21/2021    Lab Results  Component Value Date   CREATININE 0.62 11/21/2021   BUN 12 11/21/2021   NA 131 (L) 11/21/2021   K 5.1 11/21/2021   CL 94 (L) 11/21/2021   CO2 30 11/21/2021    Lab Results  Component Value Date   ALT 54 (H) 11/21/2021   AST 41 11/21/2021   GGT 98 (H) 11/17/2021   ALKPHOS 192 (H) 11/21/2021     Microbiology: Recent Results (from the past 240 hour(s))  Resp Panel by RT-PCR (Flu A&B, Covid) Nasopharyngeal Swab     Status: None   Collection Time: 11/16/21 12:45 PM   Specimen: Nasopharyngeal Swab; Nasopharyngeal(NP) swabs in vial transport medium  Result Value Ref Range Status   SARS Coronavirus 2 by RT PCR NEGATIVE NEGATIVE Final    Comment: (NOTE) SARS-CoV-2 target nucleic acids are NOT DETECTED.  The SARS-CoV-2 RNA is generally detectable in upper respiratory specimens during the acute phase of infection. The lowest concentration of SARS-CoV-2 viral copies this assay can detect is 138 copies/mL. A negative result does not preclude SARS-Cov-2 infection and should not be used as the sole basis for treatment or other patient management decisions. A negative result may occur with  improper specimen  collection/handling, submission of specimen other than nasopharyngeal swab, presence of viral mutation(s) within the areas targeted by this assay, and inadequate number of viral copies(<138 copies/mL). A negative result must be combined with clinical observations, patient history, and epidemiological information. The expected result is Negative.  Fact Sheet for Patients:  BloggerCourse.com  Fact Sheet for Healthcare Providers:  SeriousBroker.it  This test is no t yet approved or cleared by the [place] FDA and  has been authorized for detection and/or diagnosis of SARS-CoV-2 by FDA under an Emergency Use Authorization (EUA). This EUA will remain  in effect (meaning this test can be used) for the duration of the COVID-19 declaration under Section 564(b)(1) of the Act, 21 U.S.C.section 360bbb-3(b)(1), unless the authorization is terminated  or revoked sooner.       Influenza A by PCR NEGATIVE NEGATIVE Final   Influenza B by PCR NEGATIVE NEGATIVE Final    Comment: (NOTE) The Xpert Xpress SARS-CoV-2/FLU/RSV plus assay is intended as an aid in the diagnosis of influenza from Nasopharyngeal swab specimens and should not be used as a sole basis for treatment. Nasal washings and aspirates are unacceptable for Xpert Xpress SARS-CoV-2/FLU/RSV testing.  Fact Sheet for Patients: BloggerCourse.com  Fact Sheet for Healthcare Providers: SeriousBroker.it  This test is not yet approved or cleared by the [place] FDA and has been authorized for detection and/or diagnosis of SARS-CoV-2 by FDA  under an Emergency Use Authorization (EUA). This EUA will remain in effect (meaning this test can be used) for the duration of the COVID-19 declaration under Section 564(b)(1) of the Act, 21 U.S.C. section 360bbb-3(b)(1), unless the authorization is terminated or revoked.  Performed at [facility], [address], [place], [place] [address]   Culture, blood (Routine x 2)     Status: None   Collection Time: 11/16/21  1:00 PM   Specimen: Left Antecubital; Blood  Result Value Ref Range Status   Specimen Description   Final    LEFT ANTECUBITAL Performed at [facility], [address], [place], [place] [address]    Special Requests   Final    BOTTLES DRAWN AEROBIC AND ANAEROBIC Blood Culture adequate volume Performed at [facility], [address], [place], [place] [address]    Culture   Final    NO GROWTH 5 DAYS Performed at [facility], [place] [address] [place], [place] [address]    Report Status 11/21/2021 FINAL  Final  Culture, blood (Routine x 2)     Status: Abnormal (Preliminary result)   Collection Time: 11/16/21  2:20 PM   Specimen: Right Antecubital; Blood  Result Value Ref Range Status   Specimen Description   Final    RIGHT ANTECUBITAL Performed at [facility], [address], [place], [place] [address]    Special Requests   Final    BOTTLES DRAWN AEROBIC AND ANAEROBIC Blood Culture adequate volume Performed at [facility], [place], [place] [address]    Culture  Setup Time (A)  Final    YEAST AEROBIC BOTTLE ONLY CRITICAL RESULT CALLED TO, READ BACK BY AND VERIFIED WITH: MSG EPIC MD [facility] AT [id number] ON 11/19/2021 B Y T.SAAD.    Culture (A)  Final    CANDIDA PARAPSILOSIS Sent to [facility] for further susceptibility testing. Performed at [facility], [place] [address] [place], [place] [address]    Report Status PENDING  Incomplete  Blood Culture ID Panel (Reflexed)     Status: Abnormal   Collection Time: 11/16/21  2:20 PM  Result Value Ref Range Status   Enterococcus faecalis NOT DETECTED NOT DETECTED Final   Enterococcus Faecium NOT DETECTED NOT DETECTED Final   Listeria monocytogenes NOT DETECTED NOT DETECTED Final    Staphylococcus species NOT DETECTED NOT DETECTED Final   Staphylococcus aureus (BCID) NOT DETECTED NOT DETECTED Final   Staphylococcus epidermidis NOT DETECTED NOT DETECTED Final   Staphylococcus lugdunensis NOT DETECTED NOT DETECTED Final   Streptococcus species NOT DETECTED NOT DETECTED Final   Streptococcus agalactiae NOT DETECTED NOT DETECTED Final   Streptococcus pneumoniae NOT DETECTED NOT DETECTED Final   Streptococcus pyogenes NOT DETECTED NOT DETECTED Final   A.calcoaceticus-baumannii NOT DETECTED NOT DETECTED Final   Bacteroides fragilis NOT DETECTED NOT DETECTED Final   Enterobacterales NOT DETECTED NOT DETECTED Final   Enterobacter cloacae complex NOT DETECTED NOT DETECTED Final   Escherichia coli NOT DETECTED NOT DETECTED Final   Klebsiella aerogenes NOT DETECTED NOT DETECTED Final   Klebsiella oxytoca NOT DETECTED NOT DETECTED Final   Klebsiella pneumoniae NOT DETECTED NOT DETECTED Final   Proteus species NOT DETECTED NOT DETECTED Final   Salmonella species NOT DETECTED NOT DETECTED Final   Serratia marcescens NOT DETECTED NOT DETECTED Final   Haemophilus influenzae NOT DETECTED NOT DETECTED Final   Neisseria meningitidis NOT DETECTED NOT DETECTED Final   Pseudomonas aeruginosa NOT DETECTED NOT DETECTED  Final   Stenotrophomonas maltophilia NOT DETECTED NOT DETECTED Final   Candida albicans NOT DETECTED NOT DETECTED Final   Candida auris NOT DETECTED NOT DETECTED Final   Candida glabrata NOT DETECTED NOT DETECTED Final   Candida krusei NOT DETECTED NOT DETECTED Final   Candida parapsilosis DETECTED (A) NOT DETECTED Final    Comment: CRITICAL RESULT CALLED TO, READ BACK BY AND VERIFIED WITH: MSG EPIC MD [facility] AT [id number] ON 11/19/2021 B Y T.SAAD.    Candida tropicalis NOT DETECTED NOT DETECTED Final   Cryptococcus neoformans/gattii NOT DETECTED NOT DETECTED Final    Comment: Performed at [facility], [place] [address] [place], [place] [address]  Urine Culture      Status: Abnormal   Collection Time: 11/16/21  2:45 PM   Specimen: In/Out Cath Urine  Result Value Ref Range Status   Specimen Description   Final    IN/OUT CATH URINE Performed at [facility], [address] [place], [place] [address]    Special Requests   Final    NONE Performed at [facility], [place], [place] [address]    Culture (A)  Final    10,000 COLONIES/mL VIRIDANS STREPTOCOCCUS Standardized susceptibility testing for this organism is not available. Performed at [facility], [place] [address], [place], [place] [address]    Report Status 11/18/2021 FINAL  Final  Blood culture (routine x 2)     Status: None (Preliminary result)   Collection Time: 11/19/21  3:17 PM   Specimen: BLOOD  Result Value Ref Range Status   Specimen Description   Final    BLOOD LEFT ANTECUBITAL Performed at [facility] [address] [place], [place] [address]    Special Requests   Final    BOTTLES DRAWN AEROBIC AND ANAEROBIC Blood Culture adequate volume Performed at [facility] [address], [place], [place] [address]    Culture   Final    NO GROWTH 2 DAYS Performed at [facility] [address], [place], [place] [address]    Report Status PENDING  Incomplete  Blood culture (routine x 2)     Status: None (Preliminary result)   Collection Time: 11/19/21  3:23 PM   Specimen: BLOOD  Result Value Ref Range Status   Specimen Description   Final    BLOOD RIGHT ANTECUBITAL Performed at [facility] [address] [place], [place] [address]    Special Requests   Final    BOTTLES DRAWN AEROBIC AND ANAEROBIC Blood Culture adequate volume Performed at [facility] [address] [place], [place] [address]    Culture   Final    NO GROWTH 2 DAYS Performed at [facility] [address], [place], [place] [address]    Report Status  PENDING  Incomplete  Resp Panel by RT-PCR (Flu A&B, Covid) Nasopharyngeal Swab     Status: None   Collection Time: 11/19/21  3:37 PM   Specimen: Nasopharyngeal Swab; Nasopharyngeal(NP) swabs in vial transport medium  Result Value Ref Range Status   SARS Coronavirus 2 by RT PCR NEGATIVE NEGATIVE Final    Comment: (NOTE) SARS-CoV-2 target nucleic acids are NOT DETECTED.  The SARS-CoV-2 RNA is generally detectable in upper respiratory specimens during the acute phase of infection. The lowest concentration of SARS-CoV-2 viral copies this assay can detect is 138 copies/mL. A negative result does not preclude SARS-Cov-2 infection and should not be  used as the sole basis for treatment or other patient management decisions. A negative result may occur with  improper specimen collection/handling, submission of specimen other than nasopharyngeal swab, presence of viral mutation(s) within the areas targeted by this assay, and inadequate number of viral copies(<138 copies/mL). A negative result must be combined with clinical observations, patient history, and epidemiological information. The expected result is Negative.  Fact Sheet for Patients:  EntrepreneurPulse.com.au  Fact Sheet for Healthcare Providers:  IncredibleEmployment.be  This test is no t yet approved or cleared by the [place] FDA and  has been authorized for detection and/or diagnosis of SARS-CoV-2 by FDA under an Emergency Use Authorization (EUA). This EUA will remain  in effect (meaning this test can be used) for the duration of the COVID-19 declaration under Section 564(b)(1) of the Act, 21 U.S.C.section 360bbb-3(b)(1), unless the authorization is terminated  or revoked sooner.       Influenza A by PCR NEGATIVE NEGATIVE Final   Influenza B by PCR NEGATIVE NEGATIVE Final    Comment: (NOTE) The Xpert Xpress SARS-CoV-2/FLU/RSV plus assay is intended as an aid in the diagnosis of  influenza from Nasopharyngeal swab specimens and should not be used as a sole basis for treatment. Nasal washings and aspirates are unacceptable for Xpert Xpress SARS-CoV-2/FLU/RSV testing.  Fact Sheet for Patients: EntrepreneurPulse.com.au  Fact Sheet for Healthcare Providers: IncredibleEmployment.be  This test is not yet approved or cleared by the [place] FDA and has been authorized for detection and/or diagnosis of SARS-CoV-2 by FDA under an Emergency Use Authorization (EUA). This EUA will remain in effect (meaning this test can be used) for the duration of the COVID-19 declaration under Section 564(b)(1) of the Act, 21 U.S.C. section 360bbb-3(b)(1), unless the authorization is terminated or revoked.  Performed at [facility], [place] [address] [place], [place] [address]     Impression/Plan:  1. C parapsilosis in blood cultures - repeat cultures ngtd.  On anidulafungin and some loose stools but otherwise tolerating well.   Will continue TTE noted and I agree, likely will need TEE with concerning findings on TV. I discussed this with the patient and wife and they are in agreement to proceed I appreciate ENT and ophthalmology assistance with this patient  2.  Leukocytosis - trending down from a peak of 42.   Will continue to monitor  3.  Sinusitis - mild and no CNS extension or other concerns.  Follow up with ENT as an outpatient.   4.  Picc line ok on Monday if repeat blood cultures remain negativ.e

## 2021-11-21 NOTE — Progress Notes (Signed)
?  Progress Note ? ? ?Patient: Kyle Velazquez [id number] DOB: [birth year] DOA: 11/19/2021     1 ?DOS: the patient was seen and examined on 11/21/2021 ?  ?Brief hospital course: ?66 y.o. male with medical history significant of celiac disease, chronic back pain, chronic shoulder pain, diabetes mellitus type 2 with neuropathy, hyperlipidemia, hypertension, recent admission from 11/16/2021-11/18/2021 for pneumonia treated with IV antibiotics and discharged home on oral antibiotics was admitted on 11/19/2021 after his blood cultures from prior admission grew yeast and ID recommended hospitalization. ? ?Assessment and Plan: ?* Fungemia ?- Questionable cause.  Blood cultures from recent prior hospitalization grew yeast/Candida parapsilosis: Follow sensitivities. ?-Continue anidulafungin ?-Follow repeat blood cultures.  ID following.  Echo showed EF of 70 to 75% with grade 2 diastolic dysfunction and vegetation could not be excluded and recommended TEE.  Will involve cardiology to arrange TEE. ?-Currently afebrile and hemodynamically stable ? ?CAP (community acquired pneumonia) ?- Patient was recently hospitalized and discharged for pneumonia on oral antibiotics.  Continue and complete course of antibiotic therapy ? ?HTN (hypertension) ?- Monitor blood pressure.  Continue hydrochlorothiazide and irbesartan. ? ?Type 2 diabetes mellitus with hyperglycemia ([place]) ?- Carb modified diet.  Continue CBGs with SSI. ? ?Diabetic neuropathy ([place]) ?- Continue current regimen.  Outpatient follow-up. ? ?Hyperlipidemia ?- Not on any medications as an outpatient.  Outpatient follow-up with PCP. ? ?GAD (generalized anxiety disorder) ?- Continue sertraline.  Continue Xanax as needed.  Outpatient follow-up with PCP. ? ? ? ? ?  ? ?Subjective:  ?Seen and examined at bedside.  No fever, vomiting, worsening shortness of breath reported. ? ?Physical Exam: ?Vitals:  ? 11/20/21 0430 11/20/21 1309 11/20/21 2122 11/21/21 0532  ?BP: (!) 161/77 (!) 166/67 (!)  160/61 (!) 157/81  ?Pulse: 61 (!) 59 (!) 55 66  ?Resp: 18 16 17 17   ?Temp: (!) 97.5 ?F (36.4 ?C) 98 ?F (36.7 ?C) 98 ?F (36.7 ?C) 97.7 ?F (36.5 ?C)  ?TempSrc: Oral Oral Oral Oral  ?SpO2: 96% 97% 97% 95%  ?Weight:      ?Height:      ? ?General: On room air.  No distress ?ENT/neck: No thyromegaly.  JVD is not elevated  ?respiratory: Decreased breath sounds at bases bilaterally with some crackles; no wheezing CVS: Intermittently bradycardic; S1-S2 heard  ?abdominal: Soft, nontender, slightly distended; no organomegaly, normal bowel sounds are heard ?Extremities: Trace lower extremity edema; no cyanosis  ?CNS: Awake and alert.  No focal neurologic deficit.  Moves extremities ?Lymph: No obvious lymphadenopathy ?Skin: No obvious ecchymosis/lesions  ?psych: Affect, judgment and mood are normal  ?musculoskeletal: No obvious joint swelling/deformity ? ?Data Reviewed: ?I have reviewed patient's investigations during this hospitalization and recent hospitalization myself.  Today, labs are not available yet ? ?Family Communication: None at bedside ?  ?Disposition: ?Status is: Observation ?The patient will require care spanning > 2 midnights and should be moved to inpatient because: Of need for IV antifungals ? Planned Discharge Destination: Home ? ? ? ? ? ?Author: ?Aline August, MD ?11/21/2021 7:51 AM ? ?For on call review [email or web address].  ?

## 2021-11-21 NOTE — Consult Note (Signed)
Reason for Consult: Fungemia, sinusitis Referring Physician: Aline August, MD  Kyle Velazquez is an 66 y.o. male.  HPI: Patient of Dr. Wilburn Cornelia in my practice, was last seen several weeks ago for reflux issues.  He had problems with nasal allergies for many years.  He was diagnosed with fungemia after being treated for pneumonia couple weeks ago.  There is concern about possible sinus disease as a source of the fungemia.  He does not have any symptoms of pain or pressure and just has his normal nasal congestion and drainage that he has had for many years.  Past Medical History:  Diagnosis Date   Celiac disease    Chronic back pain    Chronic shoulder pain    Diabetes 1.5, managed as type 2 ([place])    Diabetic neuropathy ([place])    Hyperlipidemia    Hypertension    Internal hemorrhoids     Past Surgical History:  Procedure Laterality Date   COLONOSCOPY  2007 2016   Dr Earlean Shawl    ESOPHAGOGASTRODUODENOSCOPY  2011   Dr Earlean Shawl    NECK SURGERY     SHOULDER SURGERY  2009    Family History  Problem Relation Age of Onset   CAD Mother    CAD Father     Social History:  reports that he has quit smoking. He has never used smokeless tobacco. He reports that he does not drink alcohol and does not use drugs.  Allergies: No Known Allergies  Medications: Reviewed  Results for orders placed or performed during the hospital encounter of 11/19/21 (from the past 48 hour(s))  Urinalysis, Routine w reflex microscopic Urine, Clean Catch     Status: None   Collection Time: 11/19/21  2:22 PM  Result Value Ref Range   Color, Urine YELLOW YELLOW   APPearance CLEAR CLEAR   Specific Gravity, Urine 1.013 1.005 - 1.030   pH 7.0 5.0 - 8.0   Glucose, UA NEGATIVE NEGATIVE mg/dL   Hgb urine dipstick NEGATIVE NEGATIVE   Bilirubin Urine NEGATIVE NEGATIVE   Ketones, ur NEGATIVE NEGATIVE mg/dL   Protein, ur NEGATIVE NEGATIVE mg/dL   Nitrite NEGATIVE NEGATIVE   Leukocytes,Ua NEGATIVE NEGATIVE     Comment: Performed at [facility], [place] [address], [place], [place] [address]  Blood culture (routine x 2)     Status: None (Preliminary result)   Collection Time: 11/19/21  3:17 PM   Specimen: BLOOD  Result Value Ref Range   Specimen Description      BLOOD LEFT ANTECUBITAL Performed at [facility], [place] [address] [place], [place] [address]    Special Requests      BOTTLES DRAWN AEROBIC AND ANAEROBIC Blood Culture adequate volume Performed at [place] [facility] [address] [place], [place] [address]    Culture      NO GROWTH 2 DAYS Performed at [facility], [place] [address], [place], [place] [address]    Report Status PENDING   Blood culture (routine x 2)     Status: None (Preliminary result)   Collection Time: 11/19/21  3:23 PM   Specimen: BLOOD  Result Value Ref Range   Specimen Description      BLOOD RIGHT ANTECUBITAL Performed at [facility], [place] [address], [place], [place] [address]    Special Requests      BOTTLES DRAWN AEROBIC AND ANAEROBIC Blood Culture adequate volume Performed at [place] [address] [place], [place]  [address]    Culture      NO GROWTH 2 DAYS Performed at [place] [address], [place], [place] [address]    Report Status PENDING   CBC with Differential     Status: Abnormal   Collection Time: 11/19/21  3:23 PM  Result Value Ref Range   WBC 25.5 (H) 4.0 - 10.5 K/uL   RBC 3.84 (L) 4.22 - 5.81 MIL/uL   Hemoglobin 11.9 (L) 13.0 - 17.0 g/dL   HCT 35.4 (L) 39.0 - 52.0 %   MCV 92.2 80.0 - 100.0 fL   MCH 31.0 26.0 - 34.0 pg   MCHC 33.6 30.0 - 36.0 g/dL   RDW 13.9 11.5 - 15.5 %   Platelets 437 (H) 150 - 400 K/uL   nRBC 0.0 0.0 - 0.2 %   Neutrophils Relative % 80 %   Neutro Abs 20.9 (H) 1.7 - 7.7 K/uL   Band Neutrophils 2 %   Lymphocytes Relative 15 %   Lymphs Abs 3.8 0.7 - 4.0 K/uL   Monocytes Relative 2 %   Monocytes  Absolute 0.5 0.1 - 1.0 K/uL   Eosinophils Relative 1 %   Eosinophils Absolute 0.3 0.0 - 0.5 K/uL   Basophils Relative 0 %   Basophils Absolute 0.0 0.0 - 0.1 K/uL   WBC Morphology MILD LEFT SHIFT (1-5% METAS, OCC MYELO, OCC BANDS)     Comment: TOXIC GRANULATION   Abs Immature Granulocytes 0.00 0.00 - 0.07 K/uL   Giant PLTs PRESENT     Comment: Performed at [facility], [place] [address] [place], [place] [id number]  Basic metabolic panel     Status: Abnormal   Collection Time: 11/19/21  3:23 PM  Result Value Ref Range   Sodium 134 (L) 135 - 145 mmol/L   Potassium 4.0 3.5 - 5.1 mmol/L   Chloride 96 (L) 98 - 111 mmol/L   CO2 31 22 - 32 mmol/L   Glucose, Bld 176 (H) 70 - 99 mg/dL    Comment: Glucose reference range applies only to samples taken after fasting for at least 8 hours.   BUN 12 8 - 23 mg/dL   Creatinine, Ser 0.54 (L) 0.61 - 1.24 mg/dL   Calcium 9.4 8.9 - 10.3 mg/dL   GFR, Estimated >60 >60 mL/min    Comment: (NOTE) Calculated using the CKD-EPI Creatinine Equation (2021)    Anion gap 7 5 - 15    Comment: Performed at [facility], [place] [address] [place], [place] [address]  Resp Panel by RT-PCR (Flu A&B, Covid) Nasopharyngeal Swab     Status: None   Collection Time: 11/19/21  3:37 PM   Specimen: Nasopharyngeal Swab; Nasopharyngeal(NP) swabs in vial transport medium  Result Value Ref Range   SARS Coronavirus 2 by RT PCR NEGATIVE NEGATIVE    Comment: (NOTE) SARS-CoV-2 target nucleic acids are NOT DETECTED.  The SARS-CoV-2 RNA is generally detectable in upper respiratory specimens during the acute phase of infection. The lowest concentration of SARS-CoV-2 viral copies this assay can detect is 138 copies/mL. A negative result does not preclude SARS-Cov-2 infection and should not be used as the sole basis for treatment or other patient management decisions. A negative result may occur with  improper specimen collection/handling, submission  of specimen other than nasopharyngeal swab, presence of viral mutation(s) within the areas targeted by this assay, and inadequate number of viral copies(<138 copies/mL). A negative result must be combined with clinical observations, patient history,  and epidemiological information. The expected result is Negative.  Fact Sheet for Patients:  EntrepreneurPulse.com.au  Fact Sheet for Healthcare Providers:  IncredibleEmployment.be  This test is no t yet approved or cleared by the [place] FDA and  has been authorized for detection and/or diagnosis of SARS-CoV-2 by FDA under an Emergency Use Authorization (EUA). This EUA will remain  in effect (meaning this test can be used) for the duration of the COVID-19 declaration under Section 564(b)(1) of the Act, 21 U.S.C.section 360bbb-3(b)(1), unless the authorization is terminated  or revoked sooner.       Influenza A by PCR NEGATIVE NEGATIVE   Influenza B by PCR NEGATIVE NEGATIVE    Comment: (NOTE) The Xpert Xpress SARS-CoV-2/FLU/RSV plus assay is intended as an aid in the diagnosis of influenza from Nasopharyngeal swab specimens and should not be used as a sole basis for treatment. Nasal washings and aspirates are unacceptable for Xpert Xpress SARS-CoV-2/FLU/RSV testing.  Fact Sheet for Patients: EntrepreneurPulse.com.au  Fact Sheet for Healthcare Providers: IncredibleEmployment.be  This test is not yet approved or cleared by the [place] FDA and has been authorized for detection and/or diagnosis of SARS-CoV-2 by FDA under an Emergency Use Authorization (EUA). This EUA will remain in effect (meaning this test can be used) for the duration of the COVID-19 declaration under Section 564(b)(1) of the Act, 21 U.S.C. section 360bbb-3(b)(1), unless the authorization is terminated or revoked.  Performed at [facility], [place] [address], [place], [place] [address]   Glucose, capillary     Status: Abnormal   Collection Time: 11/19/21  5:55 PM  Result Value Ref Range   Glucose-Capillary 150 (H) 70 - 99 mg/dL    Comment: Glucose reference range applies only to samples taken after fasting for at least 8 hours.  Glucose, capillary     Status: Abnormal   Collection Time: 11/19/21  9:48 PM  Result Value Ref Range   Glucose-Capillary 138 (H) 70 - 99 mg/dL    Comment: Glucose reference range applies only to samples taken after fasting for at least 8 hours.  CBC     Status: Abnormal   Collection Time: 11/20/21  5:19 AM  Result Value Ref Range   WBC 20.1 (H) 4.0 - 10.5 K/uL   RBC 3.82 (L) 4.22 - 5.81 MIL/uL   Hemoglobin 11.8 (L) 13.0 - 17.0 g/dL   HCT 36.0 (L) 39.0 - 52.0 %   MCV 94.2 80.0 - 100.0 fL   MCH 30.9 26.0 - 34.0 pg   MCHC 32.8 30.0 - 36.0 g/dL   RDW 14.1 11.5 - 15.5 %   Platelets 398 150 - 400 K/uL   nRBC 0.0 0.0 - 0.2 %    Comment: Performed at [facility], [place] [address], [place], [place] [address]  Comprehensive metabolic panel     Status: Abnormal   Collection Time: 11/20/21  5:19 AM  Result Value Ref Range   Sodium 134 (L) 135 - 145 mmol/L   Potassium 4.7 3.5 - 5.1 mmol/L   Chloride 97 (L) 98 - 111 mmol/L   CO2 28 22 - 32 mmol/L   Glucose, Bld 165 (H) 70 - 99 mg/dL    Comment: Glucose reference range applies only to samples taken after fasting for at least 8 hours.   BUN 9 8 - 23 mg/dL   Creatinine, Ser 0.61 0.61 - 1.24 mg/dL   Calcium 9.3 8.9 - 10.3 mg/dL   Total Protein 7.1 6.5 - 8.1  g/dL   Albumin 2.5 (L) 3.5 - 5.0 g/dL   AST 65 (H) 15 - 41 U/L   ALT 59 (H) 0 - 44 U/L   Alkaline Phosphatase 210 (H) 38 - 126 U/L   Total Bilirubin 0.6 0.3 - 1.2 mg/dL   GFR, Estimated >60 >60 mL/min    Comment: (NOTE) Calculated using the CKD-EPI Creatinine Equation (2021)    Anion gap 9 5 - 15    Comment: Performed at [facility], [place] [address], [place], [place]  [address]  Glucose, capillary     Status: Abnormal   Collection Time: 11/20/21 12:14 PM  Result Value Ref Range   Glucose-Capillary 150 (H) 70 - 99 mg/dL    Comment: Glucose reference range applies only to samples taken after fasting for at least 8 hours.  Glucose, capillary     Status: Abnormal   Collection Time: 11/20/21  5:54 PM  Result Value Ref Range   Glucose-Capillary 223 (H) 70 - 99 mg/dL    Comment: Glucose reference range applies only to samples taken after fasting for at least 8 hours.  Glucose, capillary     Status: Abnormal   Collection Time: 11/20/21  9:21 PM  Result Value Ref Range   Glucose-Capillary 195 (H) 70 - 99 mg/dL    Comment: Glucose reference range applies only to samples taken after fasting for at least 8 hours.   Comment 1 Notify RN    Comment 2 Document in Chart   Glucose, capillary     Status: Abnormal   Collection Time: 11/20/21 11:08 PM  Result Value Ref Range   Glucose-Capillary 118 (H) 70 - 99 mg/dL    Comment: Glucose reference range applies only to samples taken after fasting for at least 8 hours.  CBC with Differential/Platelet     Status: Abnormal   Collection Time: 11/21/21  7:14 AM  Result Value Ref Range   WBC 17.8 (H) 4.0 - 10.5 K/uL   RBC 3.77 (L) 4.22 - 5.81 MIL/uL   Hemoglobin 11.6 (L) 13.0 - 17.0 g/dL   HCT 35.6 (L) 39.0 - 52.0 %   MCV 94.4 80.0 - 100.0 fL   MCH 30.8 26.0 - 34.0 pg   MCHC 32.6 30.0 - 36.0 g/dL   RDW 14.1 11.5 - 15.5 %   Platelets 425 (H) 150 - 400 K/uL   nRBC 0.0 0.0 - 0.2 %   Neutrophils Relative % 76 %   Neutro Abs 13.6 (H) 1.7 - 7.7 K/uL   Lymphocytes Relative 9 %   Lymphs Abs 1.6 0.7 - 4.0 K/uL   Monocytes Relative 6 %   Monocytes Absolute 1.0 0.1 - 1.0 K/uL   Eosinophils Relative 2 %   Eosinophils Absolute 0.4 0.0 - 0.5 K/uL   Basophils Relative 1 %   Basophils Absolute 0.1 0.0 - 0.1 K/uL   WBC Morphology MILD LEFT SHIFT (1-5% METAS, OCC MYELO, OCC BANDS)    Immature Granulocytes 6 %   Abs Immature  Granulocytes 1.11 (H) 0.00 - 0.07 K/uL   Giant PLTs PRESENT     Comment: Performed at [facility], [place] [address], [place], [place] [address]  Comprehensive metabolic panel     Status: Abnormal   Collection Time: 11/21/21  7:14 AM  Result Value Ref Range   Sodium 131 (L) 135 - 145 mmol/L   Potassium 5.1 3.5 - 5.1 mmol/L   Chloride 94 (L) 98 - 111 mmol/L   CO2 30 22 -  32 mmol/L   Glucose, Bld 296 (H) 70 - 99 mg/dL    Comment: Glucose reference range applies only to samples taken after fasting for at least 8 hours.   BUN 12 8 - 23 mg/dL   Creatinine, Ser 0.48 0.61 - 1.24 mg/dL   Calcium 9.3 8.9 - 88.9 mg/dL   Total Protein 7.1 6.5 - 8.1 g/dL   Albumin 2.4 (L) 3.5 - 5.0 g/dL   AST 41 15 - 41 U/L   ALT 54 (H) 0 - 44 U/L   Alkaline Phosphatase 192 (H) 38 - 126 U/L   Total Bilirubin 0.4 0.3 - 1.2 mg/dL   GFR, Estimated >16 >94 mL/min    Comment: (NOTE) Calculated using the CKD-EPI Creatinine Equation (2021)    Anion gap 7 5 - 15    Comment: Performed at [facility], [address], [place], [place] [address]  Magnesium     Status: None   Collection Time: 11/21/21  7:14 AM  Result Value Ref Range   Magnesium 1.7 1.7 - 2.4 mg/dL    Comment: Performed at [facility], [address], [place], [place] [address]  Glucose, capillary     Status: Abnormal   Collection Time: 11/21/21  8:01 AM  Result Value Ref Range   Glucose-Capillary 240 (H) 70 - 99 mg/dL    Comment: Glucose reference range applies only to samples taken after fasting for at least 8 hours.    CT MAXILLOFACIAL W CONTRAST  Result Date: 11/21/2021 CLINICAL DATA:  Maxillary/facial abscess. Fungi Mia with indeterminate cause. EXAM: CT MAXILLOFACIAL WITH CONTRAST TECHNIQUE: Multidetector CT imaging of the maxillofacial structures was performed with intravenous contrast. Multiplanar CT image reconstructions were also generated. RADIATION DOSE REDUCTION: This exam was  performed according to the departmental dose-optimization program which includes automated exposure control, adjustment of the mA and/or kV according to patient size and/or use of iterative reconstruction technique. CONTRAST:  38mL OMNIPAQUE IOHEXOL 300 MG/ML  SOLN COMPARISON:  Five days ago by head CT FINDINGS: Osseous: No fracture or destructive process. Orbits: No evidence of inflammation or mass. Sinuses: Partial, low-density opacification of the lower maxillary sinuses, combination of polypoid densities and secretions. Milder ethmoid opacification. No bony sinus outflow obstruction. The most posterior left dental implant has its base potentially reaching the sinus, but mucosal thickening is not centered at this level, likely incidental. Rightward nasal septal deviation. Soft tissues: Symmetric palatine tonsil thickening with tonsilliths. Limited intracranial: Negative IMPRESSION: Sinusitis but no invasive findings or specific fungal characteristics. Electronically Signed   By: Tiburcio Pea M.D.   On: 11/21/2021 11:14   ECHOCARDIOGRAM COMPLETE  Result Date: 11/20/2021    ECHOCARDIOGRAM REPORT   Patient Name:   GEDALYA DECHRISTOPHER Date of Exam: 11/20/2021 Medical Rec #:  [id number]       Height:       66.0 in Accession #:    [id number]      Weight:       139.0 lb Date of Birth:  [birth year]        BSA:          1.713 m Patient Age:    65 years        BP:           161/77 mmHg Patient Gender: M               HR:           71 bpm. Exam Location:  Inpatient Procedure:  2D Echo, Cardiac Doppler, Color Doppler and Strain Analysis Indications:    Endocarditis  History:        Patient has no prior history of Echocardiogram examinations.                 Risk Factors:Dyslipidemia, Hypertension and Diabetes.  Sonographer:    [place] Referring Phys: [id number] [facility] [place]  1. Left ventricular ejection fraction, by estimation, is 70 to 75%. The left ventricle has hyperdynamic function. The left ventricle  has no regional wall motion abnormalities. Left ventricular diastolic parameters are consistent with Grade II diastolic dysfunction (pseudonormalization). The average left ventricular global longitudinal strain is -23.0 %. The global longitudinal strain is normal.  2. Right ventricular systolic function is normal. The right ventricular size is moderately enlarged. There is mildly elevated pulmonary artery systolic pressure.  3. Left atrial size was moderately dilated.  4. Right atrial size was moderately dilated.  5. The mitral valve is normal in structure. Mild mitral valve regurgitation. No evidence of mitral stenosis.  6. There is a small (< 1 cm) independently mobilty echodensity adjacent to the insertion point of the leaflet best seen starting image series 25. Seen in image set 50 as a homogeneous echodensity on the base of the posterior leaflet.. The tricuspid valve is abnormal.  7. The aortic valve is abnormal There is a small echodensity on the ventricular side of the aortic valve that is small and may represent calcium. Aortic valve regurgitation is not visualized. Aortic valve sclerosis is present, with no evidence of aortic  valve stenosis. Comparison(s): No prior Echocardiogram. Conclusion(s)/Recommendation(s): Consider TEE given the clinical question of infective endocarditis. FINDINGS  Left Ventricle: Left ventricular ejection fraction, by estimation, is 70 to 75%. The left ventricle has hyperdynamic function. The left ventricle has no regional wall motion abnormalities. The average left ventricular global longitudinal strain is -23.0  %. The global longitudinal strain is normal. The left ventricular internal cavity size was normal in size. There is no left ventricular hypertrophy. Left ventricular diastolic parameters are consistent with Grade II diastolic dysfunction (pseudonormalization). Right Ventricle: The right ventricular size is moderately enlarged. No increase in right ventricular wall  thickness. Right ventricular systolic function is normal. There is mildly elevated pulmonary artery systolic pressure. The tricuspid regurgitant velocity is 2.89 m/s, and with an assumed right atrial pressure of 3 mmHg, the estimated right ventricular systolic pressure is [id number] mmHg. Left Atrium: Left atrial size was moderately dilated. Right Atrium: Right atrial size was moderately dilated. Pericardium: There is no evidence of pericardial effusion. Mitral Valve: The mitral valve is normal in structure. Mild mitral valve regurgitation. No evidence of mitral valve stenosis. MV peak gradient, 5.6 mmHg. The mean mitral valve gradient is 2.0 mmHg. Tricuspid Valve: There is a small (< 1 cm) independently mobilty echodensity adjacent to the insertion point of the leaflet best seen starting image series 25. Seen in image set 50 as a homogeneous echodensity on the base of the posterior leaflet. The tricuspid valve is abnormal. Tricuspid valve regurgitation is not demonstrated. No evidence of tricuspid stenosis. Aortic Valve: The aortic valve is abnormal. Aortic valve regurgitation is not visualized. Aortic valve sclerosis is present, with no evidence of aortic valve stenosis. Aortic valve mean gradient measures 8.0 mmHg. Aortic valve peak gradient measures 14.3  mmHg. Aortic valve area, by VTI measures 4.19 cm. Pulmonic Valve: The pulmonic valve was grossly normal. Pulmonic valve regurgitation is not visualized. No evidence of pulmonic stenosis. Aorta: The  aortic root is normal in size and structure. IAS/Shunts: No atrial level shunt detected by color flow Doppler.  LEFT VENTRICLE PLAX 2D LVIDd:         4.90 cm      Diastology LVIDs:         3.00 cm      LV e' medial:    9.68 cm/s LV PW:         0.80 cm      LV E/e' medial:  10.8 LV IVS:        0.80 cm      LV e' lateral:   11.10 cm/s LVOT diam:     2.50 cm      LV E/e' lateral: 9.5 LV SV:         170 LV SV Index:   99           2D Longitudinal Strain LVOT Area:     4.91  cm     2D Strain GLS Avg:     -23.0 %  LV Volumes (MOD) LV vol d, MOD A2C: 104.0 ml LV vol d, MOD A4C: 113.0 ml LV vol s, MOD A2C: 25.8 ml LV vol s, MOD A4C: 31.7 ml LV SV MOD A2C:     78.2 ml LV SV MOD A4C:     113.0 ml LV SV MOD BP:      82.5 ml RIGHT VENTRICLE RV Basal diam:  5.10 cm RV Mid diam:    2.90 cm RV S prime:     19.30 cm/s TAPSE (M-mode): 3.2 cm LEFT ATRIUM           Index        RIGHT ATRIUM           Index LA diam:      4.00 cm 2.33 cm/m   RA Area:     23.50 cm LA Vol (A2C): 75.0 ml 43.78 ml/m  RA Volume:   76.20 ml  44.48 ml/m  AORTIC VALVE                     PULMONIC VALVE AV Area (Vmax):    4.35 cm      PV Vmax:       1.14 m/s AV Area (Vmean):   3.90 cm      PV Vmean:      75.200 cm/s AV Area (VTI):     4.19 cm      PV VTI:        0.227 m AV Vmax:           189.00 cm/s   PV Peak grad:  5.2 mmHg AV Vmean:          129.500 cm/s  PV Mean grad:  3.0 mmHg AV VTI:            0.405 m AV Peak Grad:      14.3 mmHg AV Mean Grad:      8.0 mmHg LVOT Vmax:         167.50 cm/s LVOT Vmean:        103.000 cm/s LVOT VTI:          0.346 m LVOT/AV VTI ratio: 0.85  AORTA Ao Root diam: 3.40 cm MITRAL VALVE                TRICUSPID VALVE MV Area (PHT): 3.60 cm     TR Peak grad:   33.4 mmHg MV Area VTI:  4.47 cm     TR Vmax:        289.00 cm/s MV Peak grad:  5.6 mmHg MV Mean grad:  2.0 mmHg     SHUNTS MV Vmax:       1.18 m/s     Systemic VTI:  0.35 m MV Vmean:      73.0 cm/s    Systemic Diam: 2.50 cm MV Decel Time: 211 msec MR Peak grad: 104.0 mmHg MR Vmax:      510.00 cm/s MV E velocity: 105.00 cm/s MV A velocity: 116.00 cm/s MV E/A ratio:  0.91 Rudean Haskell MD Electronically signed by Rudean Haskell MD Signature Date/Time: 11/20/2021/3:47:44 PM    Final     [id number] except as listed in admit H&P  Blood pressure (!) 157/81, pulse 66, temperature 97.7 F (36.5 C), temperature source Oral, resp. rate 17, height 5\' 6"  (1.676 m), weight 63 kg, SpO2 95 %.  PHYSICAL EXAM: Overall  appearance:  Healthy appearing, in no distress Head:  Normocephalic, atraumatic. Ears: External ears are healthy. Nose: External nose is healthy in appearance. Internal nasal exam free of any lesions or obstruction. Oral Cavity/Pharynx:  There are no mucosal lesions or masses identified. Larynx/Hypopharynx: Deferred Neuro:  No identifiable neurologic deficits. Neck: No palpable neck masses.  Studies Reviewed: Maxillofacial CT reviewed.  There is evidence of a small mucous retention cyst in the left maxillary sinus, and some frothy secretions on the right but there is no obstruction of the outflow tract.  There is no evidence of clear fluid level or stippling or calcifications that would suggest fungal disease.  There is no bony destruction.  All remaining sinuses are clear.  Official reading is pending.  Procedures: none   Assessment/Plan: Left maxillary sinus mucous retention cyst of no clinical significance.  Very mild right maxillary sinus disease but no evidence of fungal infection or obstruction.  No evidence of invasive fungal disease.  Recommend continue treating medically.  Sinus surgery would be of little utility and no yield.  Follow-up as needed.  B49 J34.1 J32.0   Medical Decision Making: #/Complex Problems: 3  Data Reviewed:3  Management:3 (1-Straightforward, 2-Low, 3-Moderate, 4-High)   Izora Gala 11/21/2021, 11:34 AM

## 2021-11-21 NOTE — Consult Note (Signed)
Chief Complaint/Reason for Consultation: Fungemia, rule out ocular involvement ? ?HPI:  66 yo M with PMH DM2, HLD, HTN, celiac disease presents for ophthalmology eval. Patient had recent admission for CAP and after discharge was noted to have blood cultures positive for candida parasilopsis. Admitted 11/19/21 and started on antifungals. Ophtho consultation requested to rule out ocular involvement. Patient denies any vision changes OU, denies flashes and floaters. He states his OS has always seen worse than OD, but denies any recent change in the vision. ? ?ROS: otherwise as in HPI ? ?Patient Active Problem List  ? Diagnosis Date Noted  ? Type 2 diabetes mellitus with hyperglycemia ([place]) 11/20/2021  ? Leukocytosis 11/20/2021  ? Elevated LFTs 11/20/2021  ? Anemia of chronic disease 11/20/2021  ? Fungemia 11/19/2021  ? Hypokalemia 11/18/2021  ? CAP (community acquired pneumonia) 11/16/2021  ? Acute metabolic encephalopathy [id number]  ? Dehydration 11/16/2021  ? Hyponatremia 11/16/2021  ? Hypochloremia 11/16/2021  ? Elevated alkaline phosphatase level 11/16/2021  ? Physical deconditioning 11/16/2021  ? Rhinitis, chronic 11/17/2020  ? Chronic sore throat 07/25/2019  ? Gastroesophageal reflux disease without esophagitis 07/25/2019  ? GAD (generalized anxiety disorder) 09/03/2018  ? Chest pain 03/19/2016  ? Diabetic neuropathy ([place]) 03/05/2016  ? Nephritis and nephropathy 03/05/2016  ? Vitamin D deficiency 03/05/2016  ? Hyperlipidemia 03/05/2016  ? HTN (hypertension) 03/05/2016  ? Cervical radiculopathy 10/07/2015  ?  ?No current facility-administered medications on file prior to encounter.  ? ?Current Outpatient Medications on File Prior to Encounter  ?Medication Sig Dispense Refill  ? ALPRAZolam (XANAX) 1 MG tablet TAKE 1/2 TABLET BY MOUTH FIVE TIMES DAILY (Patient taking differently: Take 0.25 mg by mouth daily as needed for anxiety.) 75 tablet 5  ? amLODipine (NORVASC) 10 MG tablet Take 5 mg by mouth daily.    ?  azithromycin (ZITHROMAX Z-PAK) 250 MG tablet Take 1 tablet (250 mg total) by mouth daily. Take 2 tablets (500 mg) on  Day 1,  followed by 1 tablet (250 mg) once daily on Days 2 through 5. (Patient taking differently: Take 250 mg by mouth See admin instructions. Take 2 tablets (500 mg) on  Day 1,  followed by 1 tablet (250 mg) once daily on Days 2 through 5. Start date: 11/19/21.) 3 each 0  ? betamethasone dipropionate 0.05 % cream Apply 1 drop topically 2 (two) times daily as needed (skin inflammation).    ? cefdinir (OMNICEF) 300 MG capsule Take 1 capsule (300 mg total) by mouth 2 (two) times daily. (Patient taking differently: Take 300 mg by mouth 2 (two) times daily. Start date: 11/19/21) 6 capsule 0  ? fluticasone (FLONASE) 50 MCG/ACT nasal spray Place 2 sprays into both nostrils at bedtime as needed for allergies or rhinitis.    ? hydrochlorothiazide (HYDRODIURIL) 25 MG tablet Take 1 tablet (25 mg total) by mouth daily. Patient needs appointment for any future refills.  Please call office at [phone] to schedule appointment. 1st attempt. (Patient taking differently: Take 25 mg by mouth daily.) 90 tablet 3  ? hydrocortisone (ANUSOL-HC) 2.5 % rectal cream Apply 1 application. topically 2 (two) times daily as needed for anal itching.    ? hydrocortisone 2.5 % cream Apply 1 application. topically 2 (two) times daily as needed (skin irritation).    ? hyoscyamine (LEVSIN SL) 0.125 MG SL tablet Place 1 tablet (0.125 mg total) under the tongue every 4 (four) hours as needed. (Patient taking differently: Place 0.125 mg under the tongue every 4 (four) hours as  needed for cramping.) 30 tablet 2  ? insulin aspart (NOVOLOG) 100 UNIT/ML injection Inject 0-70 Units into the skin See admin instructions. Inject up to 70 units daily via pump on sliding scale    ? ketoconazole (NIZORAL) 2 % cream Apply 1 application. topically 2 (two) times daily as needed for irritation.    ? omeprazole (PRILOSEC) 40 MG capsule Take 40 mg by  mouth daily.    ? PERCOCET 10-325 MG tablet Take 1 tablet by mouth every 6 (six) hours as needed for pain.  0  ? potassium chloride SA (KLOR-CON M) 20 MEQ tablet Take 1 tablet (20 mEq total) by mouth daily. (Patient taking differently: Take 20 mEq by mouth every evening.) 90 tablet 3  ? sertraline (ZOLOFT) 25 MG tablet TAKE 1 TABLET(25 MG) BY MOUTH DAILY (Patient taking differently: Take 25 mg by mouth daily.) 90 tablet 3  ? Testosterone 20.25 MG/ACT (1.62%) GEL Apply 2 Pump topically every other day.    ? valACYclovir (VALTREX) 500 MG tablet Take 500 mg by mouth daily.    ? valsartan (DIOVAN) 320 MG tablet Take 1 tablet (320 mg total) by mouth daily. 90 tablet 3  ? amLODipine (NORVASC) 5 MG tablet Take 1 tablet (5 mg total) by mouth daily. (Patient not taking: Reported on 11/19/2021) 90 tablet 3  ? Continuous Blood Gluc Sensor (FREESTYLE LIBRE 14 DAY SENSOR) MISC by Does not apply route.    ? glucose blood test strip 1 each by Other route as needed for other. Use as instructed    ? NOVOLOG 100 UNIT/ML injection Inject 0-70 Units into the skin daily. (Patient not taking: Reported on 11/19/2021)    ? No Known Allergies  ? ?EXAMINATION ? ?VAcc (near): ?OD: 20/25-1  , PHNI ?OS: 20/40  , PH 20/30-1 ? ?Pupils:  ?OD: Equal, round, reactive, no APD ?OS: Equal, round, reactive, no APD ? ?T(Tonopen not reading, tactile soft and as below): ?OD: 15  mm Hg ?OS:  15  mm Hg ? ?CVF: full to finger counting OU ?EOM: full OU, no limitation or restriction OU ? ?Anterior segment Exam with indirect/20D lens: ?Ext/Lids: normal OU ?Conj/Sclera: white and quiet OU ?Cornea: clear OU, no abrasion or infiltrate OU, mild nasal pterygium OD>OS ?AC: Deep and Quiet OU ?Iris: Round and Flat OU ?Lens: 1-2 NS OU ? ?Dilated OU with phenylephrine and tropicamide OU @ 11:05 AM ? ?Dilated Fundus Exam: ?Vitreous: Clear OU, no fungus balls or vitritis OU ?Disc: sharp and pink OU with 0.4 c/d OD, 0.5 OS ?Macula: flat and dry OU, no chorioretinal infiltrates  OU ?Vessels: normal distribution OU, perfused OU ?Periphery: flat and attached 360 without breaks or tears OU, few dot-blot hemorrhages OD, no chorioretinal infiltrates OU ? ? ? ?Imp/Plan: ? ?Fungemia ?Blood culture from 11/16/21 positive for Candid parapsilosis ?No evidence of fungal endophthalmitis, vitritis, or retinitis.  ? ?2. DM2 with mild non-proliferative diabetic retinopathy OD ?A. recent A1c 6.8 ?B. Recommend regular routine eye exams and glucose control/bp control ? ?3. Pterygia OU ? A. Recommend UV protection, probably not visually significant ? ?4. Mild nuclear sclerotic cataracts OU ? A. Regular routine eye exams/cataract evaluation ?  ? ? ? ?Lonia Skinner, M.D. ?Ophthalmology ?[place] ? ?

## 2021-11-22 DIAGNOSIS — D75839 Thrombocytosis, unspecified: Secondary | ICD-10-CM

## 2021-11-22 DIAGNOSIS — D638 Anemia in other chronic diseases classified elsewhere: Secondary | ICD-10-CM

## 2021-11-22 LAB — CBC WITH DIFFERENTIAL/PLATELET
Abs Immature Granulocytes: 0.59 10*3/uL — ABNORMAL HIGH (ref 0.00–0.07)
Basophils Absolute: 0.1 10*3/uL (ref 0.0–0.1)
Basophils Relative: 1 %
Eosinophils Absolute: 0.3 10*3/uL (ref 0.0–0.5)
Eosinophils Relative: 3 %
HCT: 36.2 % — ABNORMAL LOW (ref 39.0–52.0)
Hemoglobin: 12 g/dL — ABNORMAL LOW (ref 13.0–17.0)
Immature Granulocytes: 5 %
Lymphocytes Relative: 15 %
Lymphs Abs: 1.8 10*3/uL (ref 0.7–4.0)
MCH: 30.8 pg (ref 26.0–34.0)
MCHC: 33.1 g/dL (ref 30.0–36.0)
MCV: 92.8 fL (ref 80.0–100.0)
Monocytes Absolute: 0.9 10*3/uL (ref 0.1–1.0)
Monocytes Relative: 7 %
Neutro Abs: 8.3 10*3/uL — ABNORMAL HIGH (ref 1.7–7.7)
Neutrophils Relative %: 69 %
Platelets: 518 10*3/uL — ABNORMAL HIGH (ref 150–400)
RBC: 3.9 MIL/uL — ABNORMAL LOW (ref 4.22–5.81)
RDW: 14.2 % (ref 11.5–15.5)
WBC: 12 10*3/uL — ABNORMAL HIGH (ref 4.0–10.5)
nRBC: 0 % (ref 0.0–0.2)

## 2021-11-22 LAB — COMPREHENSIVE METABOLIC PANEL
ALT: 45 U/L — ABNORMAL HIGH (ref 0–44)
AST: 34 U/L (ref 15–41)
Albumin: 2.7 g/dL — ABNORMAL LOW (ref 3.5–5.0)
Alkaline Phosphatase: 177 U/L — ABNORMAL HIGH (ref 38–126)
Anion gap: 7 (ref 5–15)
BUN: 14 mg/dL (ref 8–23)
CO2: 30 mmol/L (ref 22–32)
Calcium: 9.7 mg/dL (ref 8.9–10.3)
Chloride: 95 mmol/L — ABNORMAL LOW (ref 98–111)
Creatinine, Ser: 0.54 mg/dL — ABNORMAL LOW (ref 0.61–1.24)
GFR, Estimated: >60 mL/min (ref >60)
Glucose, Bld: 196 mg/dL — ABNORMAL HIGH (ref 70–99)
Potassium: 4.8 mmol/L (ref 3.5–5.1)
Sodium: 132 mmol/L — ABNORMAL LOW (ref 135–145)
Total Bilirubin: 0.5 mg/dL (ref 0.3–1.2)
Total Protein: 7.3 g/dL (ref 6.5–8.1)

## 2021-11-22 LAB — GLUCOSE, CAPILLARY
Glucose-Capillary: 169 mg/dL — ABNORMAL HIGH (ref 70–99)
Glucose-Capillary: 148 mg/dL — ABNORMAL HIGH (ref 70–99)
Glucose-Capillary: 138 mg/dL — ABNORMAL HIGH (ref 70–99)

## 2021-11-22 LAB — MAGNESIUM: Magnesium: 1.8 mg/dL (ref 1.7–2.4)

## 2021-11-22 MED ORDER — AMLODIPINE BESYLATE 5 MG PO TABS
5.0000 mg | ORAL_TABLET | Freq: Every day | ORAL | Status: DC
  Administered 2021-11-22 – 2021-11-24 (×3): 5 mg via ORAL
  Filled 2021-11-22 (×3): qty 1

## 2021-11-22 MED ORDER — HYDRALAZINE HCL 25 MG PO TABS: 25.0000 mg | ORAL_TABLET | Freq: Four times a day (QID) | ORAL | Status: DC | PRN

## 2021-11-22 NOTE — Progress Notes (Signed)
?Progress Note ? ? ?Patient: Kyle Velazquez [id number] DOB: 09-Oct-1955 DOA: 11/19/2021     2 ?DOS: the patient was seen and examined on 11/22/2021 ?  ?Brief hospital course: ?66 y.o. male with medical history significant of celiac disease, chronic back pain, chronic shoulder pain, diabetes mellitus type 2 with neuropathy, hyperlipidemia, hypertension, recent admission from 11/16/2021-11/18/2021 for pneumonia treated with IV antibiotics and discharged home on oral antibiotics was admitted on 11/19/2021 after his blood cultures from prior admission grew yeast and ID recommended hospitalization.  He was started on IV antifungal.  2D echo showed EF of 70 to 75% with grade 2 diastolic dysfunction and vegetation could not be excluded and recommended TEE.  Ophthalmology and ENT were consulted. ? ?Assessment and Plan: ?Fungemia ?- Questionable cause.  Blood cultures from recent prior hospitalization grew yeast/Candida parapsilosis: Follow sensitivities. ?-Continue anidulafungin ?-Repeat blood cultures are negative so far.  ID following.  Echo showed EF of 70 to 75% with grade 2 diastolic dysfunction and vegetation could not be excluded and recommended TEE.  Cardiology will arrange TEE.  We will keep patient n.p.o. for tomorrow morning in case patient undergoes TEE. ?-Currently afebrile and hemodynamically stable ?-ENT and ophthalmology evaluation appreciated: No signs of endophthalmitis.  Maxillofacial CT did not show signs of fungal sinusitis.  No further intervention needed as per ENT. ? ?CAP (community acquired pneumonia) ?- Patient was recently hospitalized and discharged for pneumonia on oral antibiotics.  Completed course of antibiotic therapy.  DC antibiotics. ? ?HTN (hypertension) ?- Monitor blood pressure.  Continue hydrochlorothiazide and irbesartan. ? ?Type 2 diabetes mellitus with hyperglycemia ([place]) ?- Carb modified diet.  Continue CBGs with SSI. ? ?Diabetic neuropathy ([place]) ?- Continue current regimen.  Outpatient  follow-up. ? ?Hyperlipidemia ?- Not on any medications as an outpatient.  Outpatient follow-up with PCP. ? ?GAD (generalized anxiety disorder) ?- Continue sertraline.  Continue Xanax as needed.  Outpatient follow-up with PCP. ? ? ?  ? ?Subjective:  ?Seen and examined at bedside.  Denies of chest pain, worsening shortness of breath, fever or vomiting. ?Physical Exam: ?Vitals:  ? 11/21/21 0532 11/21/21 1347 11/21/21 2027 11/22/21 0504  ?BP: (!) 157/81 (!) 177/68 (!) 174/81 (!) 183/66  ?Pulse: 66 (!) 56 64 (!) 52  ?Resp: 17 18 14 14   ?Temp: 97.7 ?F (36.5 ?C) 97.9 ?F (36.6 ?C) 98 ?F (36.7 ?C) 97.8 ?F (36.6 ?C)  ?TempSrc: Oral Oral Oral Oral  ?SpO2: 95% 98% 97% 96%  ?Weight:      ?Height:      ? ?General: No acute distress, currently on room air ?ENT/neck: No elevated JVD.  No obvious masses  ?respiratory: Bilateral decreased breath sounds at bases with some scattered crackles ?CVS: Intermittently bradycardic; S1-S2 heard  ?abdominal: Soft, nontender, mildly distended, no organomegaly, bowel sounds heard ?Extremities: No cyanosis, clubbing; mild lower extremity edema present.   ?CNS: Alert, awake and oriented.  No focal neurologic deficit.  Moving extremities. ?Lymph: No cervical lymphadenopathy ?Skin: No rashes, lesions, ulcers ?Psych: Normal mood, affect and judgment ?Musculoskeletal: No obvious joint deformity/tenderness/swelling ? ? ?Data Reviewed: ?I have reviewed patient's investigations during this hospitalization and recent hospitalization myself.  Today, sodium is 132, potassium 4.8, creatinine 0.54, alkaline phosphatase 177, ALT 45, WBC 12, hemoglobin 12, platelets 518  ? ?family Communication: None at bedside ?  ?Disposition: ?Status is: Inpatient because: Of need for IV antifungals.  Will need TEE ? Planned Discharge Destination: Home ? ? ? ? ? ?Author: ?Aline August, MD ?11/22/2021 8:03 AM ? ?For  on call review [email or web address].  ?

## 2021-11-22 NOTE — Progress Notes (Signed)
? ? ?  [facility] has been requested to perform a transesophageal echocardiogram tentatively planned for 11/23/21.  After careful review of history and examination, the risks and benefits of transesophageal echocardiogram have been explained including risks of esophageal damage, perforation (1:10,000 risk), bleeding, pharyngeal hematoma as well as other potential complications associated with conscious sedation including aspiration, arrhythmia, respiratory failure and death. Alternatives to treatment were discussed, questions were answered. Patient is willing to proceed.  ? ?Will tentatively planned for TEE 11/23/21 with Dr. Gardiner Rhyme. Orders placed.  ? ?Roby Lofts, PA-C ?11/22/2021 9:53 AM  ? ?

## 2021-11-23 ENCOUNTER — Inpatient Hospital Stay (HOSPITAL_COMMUNITY): Payer: Medicare Other | Admitting: Anesthesiology

## 2021-11-23 ENCOUNTER — Encounter (HOSPITAL_COMMUNITY)
Admission: EM | Disposition: A | Discharge status: Home / Self Care | Payer: Self-pay | Source: Home / Self Care | Attending: Internal Medicine

## 2021-11-23 ENCOUNTER — Inpatient Hospital Stay: Payer: Self-pay

## 2021-11-23 ENCOUNTER — Encounter (HOSPITAL_COMMUNITY): Payer: Self-pay | Admitting: Cardiovascular Disease

## 2021-11-23 ENCOUNTER — Encounter (HOSPITAL_COMMUNITY): Payer: Self-pay | Admitting: Internal Medicine

## 2021-11-23 ENCOUNTER — Inpatient Hospital Stay (HOSPITAL_COMMUNITY): Payer: Medicare Other

## 2021-11-23 DIAGNOSIS — D75839 Thrombocytosis, unspecified: Secondary | ICD-10-CM

## 2021-11-23 DIAGNOSIS — I38 Endocarditis, valve unspecified: Secondary | ICD-10-CM

## 2021-11-23 DIAGNOSIS — E119 Type 2 diabetes mellitus without complications: Secondary | ICD-10-CM

## 2021-11-23 DIAGNOSIS — E875 Hyperkalemia: Secondary | ICD-10-CM

## 2021-11-23 DIAGNOSIS — I10 Essential (primary) hypertension: Secondary | ICD-10-CM

## 2021-11-23 DIAGNOSIS — R7881 Bacteremia: Secondary | ICD-10-CM

## 2021-11-23 DIAGNOSIS — D649 Anemia, unspecified: Secondary | ICD-10-CM

## 2021-11-23 LAB — COMPREHENSIVE METABOLIC PANEL
ALT: 38 U/L (ref 0–44)
AST: 31 U/L (ref 15–41)
Albumin: 2.5 g/dL — ABNORMAL LOW (ref 3.5–5.0)
Alkaline Phosphatase: 151 U/L — ABNORMAL HIGH (ref 38–126)
Anion gap: 7 (ref 5–15)
BUN: 13 mg/dL (ref 8–23)
CO2: 29 mmol/L (ref 22–32)
Calcium: 9.3 mg/dL (ref 8.9–10.3)
Chloride: 95 mmol/L — ABNORMAL LOW (ref 98–111)
Creatinine, Ser: 0.71 mg/dL (ref 0.61–1.24)
GFR, Estimated: >60 mL/min (ref >60)
Glucose, Bld: 263 mg/dL — ABNORMAL HIGH (ref 70–99)
Potassium: 5.3 mmol/L — ABNORMAL HIGH (ref 3.5–5.1)
Sodium: 131 mmol/L — ABNORMAL LOW (ref 135–145)
Total Bilirubin: 0.8 mg/dL (ref 0.3–1.2)
Total Protein: 7 g/dL (ref 6.5–8.1)

## 2021-11-23 LAB — ECHO TEE
AV Mean grad: 5 mmHg
AV Peak grad: 10.1 mmHg
Ao pk vel: 1.59 m/s

## 2021-11-23 LAB — CBC WITH DIFFERENTIAL/PLATELET
Abs Immature Granulocytes: 0.32 10*3/uL — ABNORMAL HIGH (ref 0.00–0.07)
Basophils Absolute: 0.1 10*3/uL (ref 0.0–0.1)
Basophils Relative: 1 %
Eosinophils Absolute: 0.4 10*3/uL (ref 0.0–0.5)
Eosinophils Relative: 5 %
HCT: 35.9 % — ABNORMAL LOW (ref 39.0–52.0)
Hemoglobin: 11.7 g/dL — ABNORMAL LOW (ref 13.0–17.0)
Immature Granulocytes: 4 %
Lymphocytes Relative: 18 %
Lymphs Abs: 1.6 10*3/uL (ref 0.7–4.0)
MCH: 31.3 pg (ref 26.0–34.0)
MCHC: 32.6 g/dL (ref 30.0–36.0)
MCV: 96 fL (ref 80.0–100.0)
Monocytes Absolute: 0.8 10*3/uL (ref 0.1–1.0)
Monocytes Relative: 9 %
Neutro Abs: 5.8 10*3/uL (ref 1.7–7.7)
Neutrophils Relative %: 63 %
Platelets: 509 10*3/uL — ABNORMAL HIGH (ref 150–400)
RBC: 3.74 MIL/uL — ABNORMAL LOW (ref 4.22–5.81)
RDW: 14.3 % (ref 11.5–15.5)
WBC: 9.1 10*3/uL (ref 4.0–10.5)
nRBC: 0 % (ref 0.0–0.2)

## 2021-11-23 LAB — GLUCOSE, CAPILLARY
Glucose-Capillary: 166 mg/dL — ABNORMAL HIGH (ref 70–99)
Glucose-Capillary: 154 mg/dL — ABNORMAL HIGH (ref 70–99)
Glucose-Capillary: 118 mg/dL — ABNORMAL HIGH (ref 70–99)
Glucose-Capillary: 211 mg/dL — ABNORMAL HIGH (ref 70–99)
Glucose-Capillary: 193 mg/dL — ABNORMAL HIGH (ref 70–99)
Glucose-Capillary: 104 mg/dL — ABNORMAL HIGH (ref 70–99)

## 2021-11-23 LAB — MAGNESIUM: Magnesium: 1.8 mg/dL (ref 1.7–2.4)

## 2021-11-23 SURGERY — ECHOCARDIOGRAM, TRANSESOPHAGEAL
Anesthesia: Monitor Anesthesia Care

## 2021-11-23 MED ORDER — PROPOFOL 500 MG/50ML IV EMUL
INTRAVENOUS | Status: DC | PRN
Start: 2021-11-23 — End: 2021-11-23
  Administered 2021-11-23: 125 ug/kg/min via INTRAVENOUS

## 2021-11-23 MED ORDER — SODIUM CHLORIDE 0.9 % IV SOLN: INTRAVENOUS | Status: DC

## 2021-11-23 MED ORDER — PHENYLEPHRINE 40 MCG/ML (10ML) SYRINGE FOR IV PUSH (FOR BLOOD PRESSURE SUPPORT)
PREFILLED_SYRINGE | INTRAVENOUS | Status: DC | PRN
Start: 2021-11-23 — End: 2021-11-23
  Administered 2021-11-23 (×2): 80 ug via INTRAVENOUS

## 2021-11-23 NOTE — CV Procedure (Signed)
? ? ? ?  TRANSESOPHAGEAL ECHOCARDIOGRAM  ? ?NAME:  Kyle Velazquez   MRN: [id number] ?DOB:  Feb 12, 1956   ADMIT DATE: 11/19/2021 ? ?INDICATIONS: ?Fungemia ? ?PROCEDURE:  ? ?Informed consent was obtained prior to the procedure. The risks, benefits and alternatives for the procedure were discussed and the patient comprehended these risks.  Risks include, but are not limited to, cough, sore throat, vomiting, nausea, somnolence, esophageal and stomach trauma or perforation, bleeding, low blood pressure, aspiration, pneumonia, infection, trauma to the teeth and death.   ? ?After a procedural time-out, the oropharynx was anesthetized and the patient was sedated by the anesthesia service. The transesophageal probe was inserted in the esophagus and stomach without difficulty and multiple views were obtained. Anesthesia was monitored by Viann Fish, CRNA.  ? ? ?COMPLICATIONS:   ? ?There were no immediate complications. ? ?FINDINGS: ? ?No vegetation seen ? ?Oswaldo Milian MD ?[facility]  ?[address] ?[place], [place] [address] ?[phone]  ? ?1:04 PM ?  ?

## 2021-11-23 NOTE — Progress Notes (Addendum)
ID PROGRESS NOTE ? ?TEE negative for vegetation ?Repeat blood cx remain negative ( c.parapsilosis on 1 of 4 bottles on 3/6) ?Leukocytosis improved ?Remains afebrile ? ?66yo M with hx of candidemia, possible source thought to be from recurrent sinusitis/pneumonia. Admitted initially with dense left pneumonia, leukocytosis of 42K. Most recent cbc showing improvement but worsening thrombocytosis. Received 6 days of pneumonia coverage ? ?Plan to continue with anidulafungin today, and will plan to switch to oral fluconazole to complete 2 week course of treatment. ? ?Would repeat cxr if respiratory symptoms worsen. ? ?Elzie Rings Baxter Flattery MD MPH ?[facility] ?[phone] ? ?

## 2021-11-23 NOTE — Progress Notes (Signed)
CBG checked by patient via continuous monitor- 177.  ?

## 2021-11-23 NOTE — Transfer of Care (Signed)
Immediate Anesthesia Transfer of Care Note ? ?Patient: Kyle Velazquez ? ?Procedure(s) Performed: TRANSESOPHAGEAL ECHOCARDIOGRAM (TEE) ? ?Patient Location: Endoscopy Unit ? ?Anesthesia Type:MAC ? ?Level of Consciousness: awake, alert  and oriented ? ?Airway & Oxygen Therapy: Patient Spontanous Breathing ? ?Post-op Assessment: Report given to RN and Post -op Vital signs reviewed and stable ? ?Post vital signs: Reviewed and stable ? ?Last Vitals:  ?Vitals Value Taken Time  ?BP 125/70 11/23/21 1303  ?Temp 36.7 ?C 11/23/21 1302  ?Pulse 65 11/23/21 1305  ?Resp 10 11/23/21 1305  ?SpO2 97 % 11/23/21 1305  ?Vitals shown include unvalidated device data. ? ?Last Pain:  ?Vitals:  ? 11/23/21 1302  ?TempSrc: Temporal  ?PainSc: 0-No pain  ?   ? ?  ? ?Complications: No notable events documented. ?

## 2021-11-23 NOTE — Anesthesia Preprocedure Evaluation (Addendum)
Anesthesia Evaluation  ?Patient identified by MRN, date of birth, ID band ?Patient awake ? ? ? ?Reviewed: ?Allergy & Precautions, NPO status , Patient's Chart, lab work & pertinent test results ? ?Airway ?Mallampati: II ? ?TM Distance: >3 FB ?Neck ROM: Full ? ? ? Dental ?no notable dental hx. ?(+) Dental Advisory Given, Teeth Intact ?  ?Pulmonary ?pneumonia, former smoker,  ?  ?Pulmonary exam normal ?breath sounds clear to auscultation ? ? ? ? ? ? Cardiovascular ?hypertension, Pt. on medications ?pulmonary hypertensionNormal cardiovascular exam+ Valvular Problems/Murmurs MR  ?Rhythm:Regular Rate:Normal ? ?Echo 11/20/2021 ??1. Left ventricular ejection fraction, by estimation, is 70 to 75%. The left ventricle has hyperdynamic function. The left ventricle has no regional wall motion abnormalities. Left ventricular diastolic parameters are consistent with Grade II diastolic dysfunction (pseudonormalization). The average left ventricular global longitudinal strain is -23.0 %. The global longitudinal strain is normal.  ??2. Right ventricular systolic function is normal. The right ventricular size is moderately enlarged. There is mildly elevated pulmonary artery systolic pressure.  ??3. Left atrial size was moderately dilated.  ??4. Right atrial size was moderately dilated.  ??5. The mitral valve is normal in structure. Mild mitral valve  ?regurgitation. No evidence of mitral stenosis.  ??6. There is a small (< 1 cm) independently mobilty echodensity adjacent to the insertion point of the leaflet best seen starting image series 25. Seen in image set 50 as a homogeneous echodensity on the base of the posterior leaflet. The tricuspid valve is abnormal.  ??7. The aortic valve is abnormal There is a small echodensity on the ventricular side of the aortic valve that is small and may represent calcium. Aortic valve regurgitation is not visualized. Aortic valve sclerosis is present, with no  evidence of aortic ?valve stenosis.  ?  ?Neuro/Psych ?PSYCHIATRIC DISORDERS Anxiety  Neuromuscular disease   ? GI/Hepatic ?Neg liver ROS, GERD  ,  ?Endo/Other  ?negative endocrine ROSdiabetes ? Renal/GU ?Renal disease  ? ?  ?Musculoskeletal ?negative musculoskeletal ROS ?(+)  ? Abdominal ?  ?Peds ? Hematology ? ?(+) Blood dyscrasia, anemia ,   ?Anesthesia Other Findings ? ? Reproductive/Obstetrics ? ?  ? ? ? ? ? ? ? ? ? ? ? ? ? ?  ?  ? ? ? ? ? ? ?Anesthesia Physical ?Anesthesia Plan ? ?ASA: 3 ? ?Anesthesia Plan: MAC  ? ?Post-op Pain Management: Minimal or no pain anticipated  ? ?Induction: Intravenous ? ?PONV Risk Score and Plan: 1 and Propofol infusion, TIVA, Treatment may vary due to age or medical condition and Midazolam ? ?Airway Management Planned: Natural Airway ? ?Additional Equipment:  ? ?Intra-op Plan:  ? ?Post-operative Plan:  ? ?Informed Consent: I have reviewed the patients History and Physical, chart, labs and discussed the procedure including the risks, benefits and alternatives for the proposed anesthesia with the patient or authorized representative who has indicated his/her understanding and acceptance.  ? ? ? ?Dental advisory given ? ?Plan Discussed with: CRNA ? ?Anesthesia Plan Comments:   ? ? ? ? ? ?Anesthesia Quick Evaluation ? ?

## 2021-11-23 NOTE — Progress Notes (Signed)
PHARMACY NOTE -  Eraxis ? ?Pharmacy has been assisting with dosing of Eraxis  for fungemia. ?Dosage remains stable at 100 mg daily and need for further dosage adjustment appears unlikely at present.   ? ?Will sign off at this time.  Please reconsult if a change in clinical status warrants re-evaluation of dosage. ?  ?Dia Sitter, PharmD, BCPS ?11/23/2021 9:53 AM ? ?

## 2021-11-23 NOTE — Progress Notes (Signed)
?Progress Note ? ? ?Patient: Kyle Velazquez [id number] DOB: 1956/04/26 DOA: 11/19/2021     3 ?DOS: the patient was seen and examined on 11/23/2021 ?  ?Brief hospital course: ?66 y.o. male with medical history significant of celiac disease, chronic back pain, chronic shoulder pain, diabetes mellitus type 2 with neuropathy, hyperlipidemia, hypertension, recent admission from 11/16/2021-11/18/2021 for pneumonia treated with IV antibiotics and discharged home on oral antibiotics was admitted on 11/19/2021 after his blood cultures from prior admission grew yeast and ID recommended hospitalization.  He was started on IV antifungal.  2D echo showed EF of 70 to 75% with grade 2 diastolic dysfunction and vegetation could not be excluded and recommended TEE.  Ophthalmology and ENT were consulted. ? ?Assessment and Plan: ?Fungemia ?- Questionable cause.  Blood cultures from recent prior hospitalization grew yeast/Candida parapsilosis: Follow sensitivities. ?-Continue anidulafungin ?-Repeat blood cultures are negative so far.  ID following.  Echo showed EF of 70 to 75% with grade 2 diastolic dysfunction and vegetation could not be excluded and recommended TEE.  Possible TEE today as per cardiology ?-Currently afebrile and hemodynamically stable ?-ENT and ophthalmology evaluation appreciated: No signs of endophthalmitis.  Maxillofacial CT did not show signs of fungal sinusitis.  No further intervention needed as per ENT. ? ?CAP (community acquired pneumonia) ?- Patient was recently hospitalized and discharged for pneumonia on oral antibiotics.  Completed course of antibiotic therapy.  DC'd antibiotics on 11/22/2021. ? ?HTN (hypertension) ?- Monitor blood pressure.  Continue hydrochlorothiazide.  Hold irbesartan today because of hyperkalemia. ? ?Hyperkalemia ?-Mild.  Repeat a.m. labs ? ?Hyponatremia ?-Mild.  Monitor ? ?Leukocytosis ?-Resolved ? ?Elevated LFTs ?-Much improved.  Only alkaline phosphatase is slightly elevated currently.   Monitor ? ?Thrombocytosis ?-Possibly reactive.  Monitor ? ?Anemia of chronic disease ?-Possibly from chronic illnesses.  Hemoglobin stable.  Monitor ? ?Type 2 diabetes mellitus with hyperglycemia ([facility]) ?- Carb modified diet.  Continue CBGs with SSI. ? ?Diabetic neuropathy ([place]) ?- Continue current regimen.  Outpatient follow-up. ? ?Hyperlipidemia ?- Not on any medications as an outpatient.  Outpatient follow-up with PCP. ? ?GAD (generalized anxiety disorder) ?- Continue sertraline.  Continue Xanax as needed.  Outpatient follow-up with PCP. ? ? ?  ? ?Subjective:  ?Seen and examined at bedside.  No fever, chest pain, worsening shortness of breath or vomiting reported.   ?Physical Exam: ?Vitals:  ? 11/22/21 1431 11/22/21 1849 11/22/21 2027 11/23/21 0437  ?BP: (!) 195/77 (!) 158/69 (!) 177/76 (!) 159/70  ?Pulse: 66 (!) 54 (!) 55 62  ?Resp: 15  14 14   ?Temp: 98 ?F (36.7 ?C)  98.5 ?F (36.9 ?C) 98.1 ?F (36.7 ?C)  ?TempSrc: Oral  Oral Oral  ?SpO2: 95%  96% 97%  ?Weight:      ?Height:      ? ?General: On room air currently.  No distress.   ?ENT/neck: No palpable thyromegaly.  JVD is not elevated  ?respiratory: Decreased breath sounds at bases bilaterally with scattered crackles  ?CVS: S1-S2 heard; bradycardic intermittently ?abdominal: Soft, nontender, distended slightly; no organomegaly, normal bowel sounds heart extremities: Trace lower extremity edema present; no cyanosis  ?CNS: Alert and oriented.  No focal neurologic deficit.  Moves extremities ?Lymph: No palpable lymphadenopathy  ?skin: No obvious ecchymosis/rashes ?Psych: Affect, mood and judgment are normal  ?musculoskeletal: No obvious joint swelling/tenderness ? ? ?Data Reviewed: ?I have reviewed patient's investigations during this hospitalization and recent hospitalization myself.  Today, sodium is 131, potassium 5.3, creatinine 0.71, alkaline phosphatase 151, ALT 38, WBC 9.1,  hemoglobin 11.7, platelets 509 ? ?family Communication: None at bedside ?   ?Disposition: ?Status is: Inpatient because: Of need for IV antifungals.  Will need TEE ? Planned Discharge Destination: Home ? ? ? ? ? ?Author: ?Aline August, MD ?11/23/2021 7:48 AM ? ?For on call review [email or web address].  ?

## 2021-11-23 NOTE — Progress Notes (Signed)
?  Echocardiogram ?2D Echocardiogram has been performed. ? ?Kyle Velazquez R ?11/23/2021, 1:20 PM ?

## 2021-11-23 NOTE — Interval H&P Note (Signed)
History and Physical Interval Note: ? ?11/23/2021 ?11:56 AM ? ?Kyle Velazquez  has presented today for surgery, with the diagnosis of bacteremia.  The various methods of treatment have been discussed with the patient and family. After consideration of risks, benefits and other options for treatment, the patient has consented to  Procedure(s): ?TRANSESOPHAGEAL ECHOCARDIOGRAM (TEE) (N/A) as a surgical intervention.  The patient's history has been reviewed, patient examined, no change in status, stable for surgery.  I have reviewed the patient's chart and labs.  Questions were answered to the patient's satisfaction.   ? ? ?[place] ? ? ?

## 2021-11-23 NOTE — H&P (View-Only) (Signed)
?Progress Note ? ? ?Patient: Kyle Velazquez [id number] DOB: [birth year] DOA: 11/19/2021     3 ?DOS: the patient was seen and examined on 11/23/2021 ?  ?Brief hospital course: ?66 y.o. male with medical history significant of celiac disease, chronic back pain, chronic shoulder pain, diabetes mellitus type 2 with neuropathy, hyperlipidemia, hypertension, recent admission from 11/16/2021-11/18/2021 for pneumonia treated with IV antibiotics and discharged home on oral antibiotics was admitted on 11/19/2021 after his blood cultures from prior admission grew yeast and ID recommended hospitalization.  He was started on IV antifungal.  2D echo showed EF of 70 to 75% with grade 2 diastolic dysfunction and vegetation could not be excluded and recommended TEE.  Ophthalmology and ENT were consulted. ? ?Assessment and Plan: ?Fungemia ?- Questionable cause.  Blood cultures from recent prior hospitalization grew yeast/Candida parapsilosis: Follow sensitivities. ?-Continue anidulafungin ?-Repeat blood cultures are negative so far.  ID following.  Echo showed EF of 70 to 75% with grade 2 diastolic dysfunction and vegetation could not be excluded and recommended TEE.  Possible TEE today as per cardiology ?-Currently afebrile and hemodynamically stable ?-ENT and ophthalmology evaluation appreciated: No signs of endophthalmitis.  Maxillofacial CT did not show signs of fungal sinusitis.  No further intervention needed as per ENT. ? ?CAP (community acquired pneumonia) ?- Patient was recently hospitalized and discharged for pneumonia on oral antibiotics.  Completed course of antibiotic therapy.  DC'd antibiotics on 11/22/2021. ? ?HTN (hypertension) ?- Monitor blood pressure.  Continue hydrochlorothiazide.  Hold irbesartan today because of hyperkalemia. ? ?Hyperkalemia ?-Mild.  Repeat a.m. labs ? ?Hyponatremia ?-Mild.  Monitor ? ?Leukocytosis ?-Resolved ? ?Elevated LFTs ?-Much improved.  Only alkaline phosphatase is slightly elevated currently.   Monitor ? ?Thrombocytosis ?-Possibly reactive.  Monitor ? ?Anemia of chronic disease ?-Possibly from chronic illnesses.  Hemoglobin stable.  Monitor ? ?Type 2 diabetes mellitus with hyperglycemia (HCC) ?- Carb modified diet.  Continue CBGs with SSI. ? ?Diabetic neuropathy ([place]) ?- Continue current regimen.  Outpatient follow-up. ? ?Hyperlipidemia ?- Not on any medications as an outpatient.  Outpatient follow-up with PCP. ? ?GAD (generalized anxiety disorder) ?- Continue sertraline.  Continue Xanax as needed.  Outpatient follow-up with PCP. ? ? ?  ? ?Subjective:  ?Seen and examined at bedside.  No fever, chest pain, worsening shortness of breath or vomiting reported.   ?Physical Exam: ?Vitals:  ? 11/22/21 1431 11/22/21 1849 11/22/21 2027 11/23/21 0437  ?BP: (!) 195/77 (!) 158/69 (!) 177/76 (!) 159/70  ?Pulse: 66 (!) 54 (!) 55 62  ?Resp: 15  14 14   ?Temp: 98 ?F (36.7 ?C)  98.5 ?F (36.9 ?C) 98.1 ?F (36.7 ?C)  ?TempSrc: Oral  Oral Oral  ?SpO2: 95%  96% 97%  ?Weight:      ?Height:      ? ?General: On room air currently.  No distress.   ?ENT/neck: No palpable thyromegaly.  JVD is not elevated  ?respiratory: Decreased breath sounds at bases bilaterally with scattered crackles  ?CVS: S1-S2 heard; bradycardic intermittently ?abdominal: Soft, nontender, distended slightly; no organomegaly, normal bowel sounds heart extremities: Trace lower extremity edema present; no cyanosis  ?CNS: Alert and oriented.  No focal neurologic deficit.  Moves extremities ?Lymph: No palpable lymphadenopathy  ?skin: No obvious ecchymosis/rashes ?Psych: Affect, mood and judgment are normal  ?musculoskeletal: No obvious joint swelling/tenderness ? ? ?Data Reviewed: ?I have reviewed patient's investigations during this hospitalization and recent hospitalization myself.  Today, sodium is 131, potassium 5.3, creatinine 0.71, alkaline phosphatase 151, ALT 38, WBC 9.1,  hemoglobin 11.7, platelets 509 ? ?family Communication: None at bedside ?   ?Disposition: ?Status is: Inpatient because: Of need for IV antifungals.  Will need TEE ? Planned Discharge Destination: Home ? ? ? ? ? ?Author: ?Aline August, MD ?11/23/2021 7:48 AM ? ?For on call review [email or web address].  ?

## 2021-11-23 NOTE — Anesthesia Procedure Notes (Signed)
Procedure Name: [place] ?Date/Time: 11/23/2021 12:30 PM ?Performed by: Griffin Dakin, CRNA ?Pre-anesthesia Checklist: Patient identified, Emergency Drugs available, Suction available, Timeout performed and Patient being monitored ?Patient Re-evaluated:Patient Re-evaluated prior to induction ?Oxygen Delivery Method: Nasal cannula ?Induction Type: IV induction ?Placement Confirmation: positive ETCO2 and breath sounds checked- equal and bilateral ?Dental Injury: Teeth and Oropharynx as per pre-operative assessment  ? ? ? ? ?

## 2021-11-24 ENCOUNTER — Encounter (HOSPITAL_COMMUNITY): Payer: Self-pay | Admitting: Cardiology

## 2021-11-24 DIAGNOSIS — I10 Essential (primary) hypertension: Secondary | ICD-10-CM

## 2021-11-24 LAB — CULTURE, BLOOD (ROUTINE X 2)
Culture: NO GROWTH
Culture: NO GROWTH
Special Requests: ADEQUATE
Special Requests: ADEQUATE

## 2021-11-24 LAB — GLUCOSE, CAPILLARY
Glucose-Capillary: 123 mg/dL — ABNORMAL HIGH (ref 70–99)
Glucose-Capillary: 161 mg/dL — ABNORMAL HIGH (ref 70–99)

## 2021-11-24 LAB — CBC WITH DIFFERENTIAL/PLATELET
Abs Immature Granulocytes: 0.18 10*3/uL — ABNORMAL HIGH (ref 0.00–0.07)
Basophils Absolute: 0.1 10*3/uL (ref 0.0–0.1)
Basophils Relative: 1 %
Eosinophils Absolute: 0.4 10*3/uL (ref 0.0–0.5)
Eosinophils Relative: 5 %
HCT: 36.9 % — ABNORMAL LOW (ref 39.0–52.0)
Hemoglobin: 12.4 g/dL — ABNORMAL LOW (ref 13.0–17.0)
Immature Granulocytes: 2 %
Lymphocytes Relative: 22 %
Lymphs Abs: 1.8 10*3/uL (ref 0.7–4.0)
MCH: 31.5 pg (ref 26.0–34.0)
MCHC: 33.6 g/dL (ref 30.0–36.0)
MCV: 93.7 fL (ref 80.0–100.0)
Monocytes Absolute: 0.8 10*3/uL (ref 0.1–1.0)
Monocytes Relative: 10 %
Neutro Abs: 4.8 10*3/uL (ref 1.7–7.7)
Neutrophils Relative %: 60 %
Platelets: 638 10*3/uL — ABNORMAL HIGH (ref 150–400)
RBC: 3.94 MIL/uL — ABNORMAL LOW (ref 4.22–5.81)
RDW: 14.1 % (ref 11.5–15.5)
WBC: 8 10*3/uL (ref 4.0–10.5)
nRBC: 0 % (ref 0.0–0.2)

## 2021-11-24 LAB — COMPREHENSIVE METABOLIC PANEL
ALT: 38 U/L (ref 0–44)
AST: 28 U/L (ref 15–41)
Albumin: 2.8 g/dL — ABNORMAL LOW (ref 3.5–5.0)
Alkaline Phosphatase: 161 U/L — ABNORMAL HIGH (ref 38–126)
Anion gap: 8 (ref 5–15)
BUN: 14 mg/dL (ref 8–23)
CO2: 29 mmol/L (ref 22–32)
Calcium: 9.5 mg/dL (ref 8.9–10.3)
Chloride: 95 mmol/L — ABNORMAL LOW (ref 98–111)
Creatinine, Ser: 0.59 mg/dL — ABNORMAL LOW (ref 0.61–1.24)
GFR, Estimated: >60 mL/min (ref >60)
Glucose, Bld: 195 mg/dL — ABNORMAL HIGH (ref 70–99)
Potassium: 4.8 mmol/L (ref 3.5–5.1)
Sodium: 132 mmol/L — ABNORMAL LOW (ref 135–145)
Total Bilirubin: 0.6 mg/dL (ref 0.3–1.2)
Total Protein: 7.9 g/dL (ref 6.5–8.1)

## 2021-11-24 LAB — MAGNESIUM: Magnesium: 1.8 mg/dL (ref 1.7–2.4)

## 2021-11-24 LAB — C-REACTIVE PROTEIN
CRP: 6.9 mg/dL — ABNORMAL HIGH (ref <1.0)
CRP: 1.8 mg/dL — ABNORMAL HIGH (ref <1.0)

## 2021-11-24 MED ORDER — FLUCONAZOLE 200 MG PO TABS: 400.0000 mg | ORAL_TABLET | Freq: Every day | ORAL | 0 refills | Status: AC

## 2021-11-24 MED ORDER — LOPERAMIDE HCL 2 MG PO CAPS
2.0000 mg | ORAL_CAPSULE | Freq: Three times a day (TID) | ORAL | 0 refills | Status: DC | PRN
Start: 2021-11-24 — End: 2022-02-09

## 2021-11-24 MED ORDER — FLUCONAZOLE 200 MG PO TABS
800.0000 mg | ORAL_TABLET | Freq: Once | ORAL | Status: AC
  Administered 2021-11-24: 800 mg via ORAL
  Filled 2021-11-24: qty 8
  Filled 2021-11-24: qty 4

## 2021-11-24 MED ORDER — FLUCONAZOLE 100 MG PO TABS: 400.0000 mg | ORAL_TABLET | Freq: Every day | ORAL | Status: DC

## 2021-11-24 MED ORDER — ALPRAZOLAM 1 MG PO TABS: ORAL_TABLET | ORAL | Status: DC

## 2021-11-24 NOTE — Care Management Important Message (Signed)
Important Message ? ?Patient Details IM Letter given to the Patient ?Name: Kyle Velazquez ?MRN: [id number] ?Date of Birth: [birth year] ? ? ?Medicare Important Message Given:  Yes ? ? ? ? ?[name] ?11/24/2021, 10:11 AM ?

## 2021-11-24 NOTE — Discharge Summary (Signed)
Physician Discharge Summary  ?Kyle Velazquez [id number] DOB: June 14, 1956 DOA: 11/19/2021 ? ?PCP: Reynold Bowen, MD ? ?Admit date: 11/19/2021 ?Discharge date: 11/24/2021 ? ?Admitted From: Home ?Disposition: Home ? ?Recommendations for Outpatient Follow-up:  ?Follow up with PCP in 1 week with repeat CBC/CMP ?Outpatient follow-up with ID ?Follow up in ED if symptoms worsen or new appear ? ? ?Home Health: No ?Equipment/Devices: None ? ?Discharge Condition: Stable ?CODE STATUS: Full ?Diet recommendation: Heart healthy/carb modified ? ?Brief/Interim Summary: ?66 y.o. male with medical history significant of celiac disease, chronic back pain, chronic shoulder pain, diabetes mellitus type 2 with neuropathy, hyperlipidemia, hypertension, recent admission from 11/16/2021-11/18/2021 for pneumonia treated with IV antibiotics and discharged home on oral antibiotics was admitted on 11/19/2021 after his blood cultures from prior admission grew yeast and ID recommended hospitalization.  He was started on IV antifungal.  2D echo showed EF of 70 to 75% with grade 2 diastolic dysfunction and vegetation could not be excluded and recommended TEE.  Ophthalmology and ENT were consulted.  He did not have any signs of fungal endophthalmitis.  ENT recommended no further intervention.  TEE was negative for vegetation.  Repeat blood cultures have remained negative.  He is currently hemodynamically stable.  ID has cleared the patient for discharge home today on oral Diflucan.  Discharge patient home today with close outpatient follow-up with PCP and ID. ?  ? ?Discharge Diagnoses:  ? ?Fungemia ?- Questionable cause.  Blood cultures from recent prior hospitalization grew yeast/Candida parapsilosis. ?-Treated with IV anidulafungin ?-Repeat blood cultures are negative so far.  Echo showed EF of 70 to 75% with grade 2 diastolic dysfunction and vegetation could not be excluded and recommended TEE.  TEE on 11/23/2021 was negative for vegetation. ?-Currently  afebrile and hemodynamically stable ?-ENT and ophthalmology evaluation appreciated: No signs of endophthalmitis.  Maxillofacial CT did not show signs of fungal sinusitis.  No further intervention needed as per ENT. ?-ID has cleared the patient for discharge home today on oral Diflucan.  He will get a dose of Diflucan prior to discharge today and will be discharged on 9 more days of oral Diflucan as per ID recommendations.  Discharge patient home today with close outpatient follow-up with PCP and ID. ?  ?  ?CAP (community acquired pneumonia) ?- Patient was recently hospitalized and discharged for pneumonia on oral antibiotics.  Completed course of antibiotic therapy.  DC'd antibiotics on 11/22/2021. ?  ?HTN (hypertension) ?-Blood pressure intermittently on the higher side.  Resume home regimen.  Potassium has normalized: Resume ARB.   ? ?Hyperkalemia ?-Resolved.  DC supplemental potassium on discharge  ? ?hyponatremia ?-Mild.  Outpatient follow-up. ? ?Leukocytosis ?-Resolved ?  ?Elevated LFTs ?-Much improved.  Only alkaline phosphatase is slightly elevated currently.  Outpatient follow-up. ? ?Thrombocytosis ?-Possibly reactive.  Outpatient follow-up ? ?Anemia of chronic disease ?-Possibly from chronic illnesses.  Hemoglobin stable.   ?  ?Type 2 diabetes mellitus with hyperglycemia ([place]) ?- Carb modified diet.  Continue insulin pump. ? ?Diabetic neuropathy ([place]) ?- Continue current regimen.  Outpatient follow-up. ?  ?Hyperlipidemia ?- Not on any medications as an outpatient.  Outpatient follow-up with PCP. ?  ?GAD (generalized anxiety disorder) ?- Continue sertraline.  Continue Xanax as needed.  Outpatient follow-up with PCP. ? ?Discharge Instructions ? ?Discharge Instructions   ? ? Ambulatory referral to Infectious Disease   Complete by: As directed ?  ? Diet - low sodium heart healthy   Complete by: As directed ?  ? Diet Carb Modified  Complete by: As directed ?  ? Increase activity slowly   Complete by: As  directed ?  ? ?  ? ?Allergies as of 11/24/2021   ?No Known Allergies ?  ? ?  ?Medication List  ?  ? ?STOP taking these medications   ? ?azithromycin 250 MG tablet ?Commonly known as: Zithromax Z-Pak ?  ?cefdinir 300 MG capsule ?Commonly known as: OMNICEF ?  ?potassium chloride SA 20 MEQ tablet ?Commonly known as: KLOR-CON M ?  ? ?  ? ?TAKE these medications   ? ?ALPRAZolam 1 MG tablet ?Commonly known as: Duanne Moron ?As patient is currently taking.  Follow-up with PCP regarding the same ?What changed: additional instructions ?  ?amLODipine 10 MG tablet ?Commonly known as: NORVASC ?Take 5 mg by mouth daily. ?What changed: Another medication with the same name was removed. Continue taking this medication, and follow the directions you see here. ?  ?betamethasone dipropionate 0.05 % cream ?Apply 1 drop topically 2 (two) times daily as needed (skin inflammation). ?  ?fluconazole 200 MG tablet ?Commonly known as: DIFLUCAN ?Take 2 tablets (400 mg total) by mouth daily for 9 days. ?Start taking on: November 25, 2021 ?  ?fluticasone 50 MCG/ACT nasal spray ?Commonly known as: FLONASE ?Place 2 sprays into both nostrils at bedtime as needed for allergies or rhinitis. ?  ?FreeStyle Libre 14 Day Sensor Misc ?by Does not apply route. ?  ?glucose blood test strip ?1 each by Other route as needed for other. Use as instructed ?  ?hydrochlorothiazide 25 MG tablet ?Commonly known as: HYDRODIURIL ?Take 1 tablet (25 mg total) by mouth daily. Patient needs appointment for any future refills.  Please call office at [phone] to schedule appointment. 1st attempt. ?What changed: additional instructions ?  ?hydrocortisone 2.5 % cream ?Apply 1 application. topically 2 (two) times daily as needed (skin irritation). ?  ?hydrocortisone 2.5 % rectal cream ?Commonly known as: ANUSOL-HC ?Apply 1 application. topically 2 (two) times daily as needed for anal itching. ?  ?hyoscyamine 0.125 MG SL tablet ?Commonly known as: LEVSIN SL ?Place 1 tablet (0.125 mg  total) under the tongue every 4 (four) hours as needed. ?What changed: reasons to take this ?  ?insulin aspart 100 UNIT/ML injection ?Commonly known as: novoLOG ?Inject 0-70 Units into the skin See admin instructions. Inject up to 70 units daily via pump on sliding scale ?What changed: Another medication with the same name was removed. Continue taking this medication, and follow the directions you see here. ?  ?ketoconazole 2 % cream ?Commonly known as: NIZORAL ?Apply 1 application. topically 2 (two) times daily as needed for irritation. ?  ?loperamide 2 MG capsule ?Commonly known as: IMODIUM ?Take 1 capsule (2 mg total) by mouth 3 (three) times daily as needed for diarrhea or loose stools. ?  ?omeprazole 40 MG capsule ?Commonly known as: PRILOSEC ?Take 40 mg by mouth daily. ?  ?Percocet 10-325 MG tablet ?Generic drug: oxyCODONE-acetaminophen ?Take 1 tablet by mouth every 6 (six) hours as needed for pain. ?  ?sertraline 25 MG tablet ?Commonly known as: ZOLOFT ?TAKE 1 TABLET(25 MG) BY MOUTH DAILY ?What changed:  ?how much to take ?how to take this ?when to take this ?additional instructions ?  ?Testosterone 20.25 MG/ACT (1.62%) Gel ?Apply 2 Pump topically every other day. ?  ?valACYclovir 500 MG tablet ?Commonly known as: VALTREX ?Take 500 mg by mouth daily. ?  ?valsartan 320 MG tablet ?Commonly known as: Diovan ?Take 1 tablet (320 mg total) by mouth daily. ?  ? ?  ? ?  Follow-up Information   ? ? Reynold Bowen, MD. Schedule an appointment as soon as possible for a visit in 1 week(s).   ?Specialty: Endocrinology ?Why: With repeat CBC/CMP ?Contact information: ?[address] ?[place] [address] ?[phone] ? ? ?  ?  ? ?  ?  ? ?  ? ?No Known Allergies ? ?Consultations: ?ID/ENT/ophthalmology ? ? ?Procedures/Studies: ?DG Chest 2 View ? ?Result Date: 11/16/2021 ?CLINICAL DATA:  Fever and chills since Wednesday. EXAM: CHEST - 2 VIEW COMPARISON:  None. FINDINGS: The heart size and mediastinal contours are within normal  limits. Normal pulmonary vascularity. Patchy left lower lobe consolidation. No pleural effusion or pneumothorax. No acute osseous abnormality. IMPRESSION: 1. Left lower lobe pneumonia. Followup PA and lateral c

## 2021-11-24 NOTE — Progress Notes (Signed)
[name] to be D/C'd Home per MD order.  Discussed with the patient and all questions fully answered. ? ?VSS, Skin clean, dry and intact without evidence of skin break down, no evidence of skin tears noted. ?IV catheter discontinued intact. Site without signs and symptoms of complications. Dressing and pressure applied. ? ?An After Visit Summary was printed and given to the patient. Patient prescription sent to pharmacy. ? ?D/c education completed with patient/family including follow up instructions, medication list, d/c activities limitations if indicated, with other d/c instructions as indicated by MD - patient able to verbalize understanding, all questions fully answered.  ? ?Patient instructed to return to ED, call 911, or call MD for any changes in condition.  ? ?Patient D/C home via private auto. ? ?Manuella Ghazi ?11/24/2021 11:29 AM  ?

## 2021-11-25 LAB — CULTURE, BLOOD (ROUTINE X 2): Special Requests: ADEQUATE

## 2021-11-25 LAB — ANTIFUNGAL AST 9 DRUG PANEL
Amphotericin B MIC: 0.5
Anidulafungin MIC: 2
Caspofungin MIC: 0.5
Fluconazole Islt MIC: 1
Flucytosine MIC: 0.5
Itraconazole MIC: 0.12
Micafungin MIC: 2
Posaconazole MIC: 0.06

## 2021-11-25 NOTE — Anesthesia Postprocedure Evaluation (Signed)
Anesthesia Post Note ? ?Patient: Kyle Velazquez ? ?Procedure(s) Performed: TRANSESOPHAGEAL ECHOCARDIOGRAM (TEE) ? ?  ? ?Patient location during evaluation: Endoscopy ?Anesthesia Type: MAC ?Level of consciousness: patient cooperative and awake ?Pain management: pain level controlled ?Vital Signs Assessment: post-procedure vital signs reviewed and stable ?Respiratory status: spontaneous breathing, nonlabored ventilation, respiratory function stable and patient connected to nasal cannula oxygen ?Cardiovascular status: stable and blood pressure returned to baseline ?Postop Assessment: no apparent nausea or vomiting ?Anesthetic complications: no ? ? ?No notable events documented. ? ?Last Vitals:  ?Vitals:  ? 11/24/21 0615 11/24/21 0858  ?BP: (!) 158/73 (!) 149/66  ?Pulse: 69   ?Resp: 16   ?Temp: 36.8 ?C   ?SpO2: 95%   ?  ?Last Pain:  ?Vitals:  ? 11/24/21 0615  ?TempSrc: Oral  ?PainSc:   ? ? ?  ?  ?  ?  ?  ?  ? ?Coralie Stanke ? ? ? ? ?

## 2021-11-26 LAB — CULTURE, BLOOD (ROUTINE X 2)
Culture: NO GROWTH
Culture: NO GROWTH

## 2021-12-09 ENCOUNTER — Ambulatory Visit (INDEPENDENT_AMBULATORY_CARE_PROVIDER_SITE_OTHER): Payer: Medicare Other | Admitting: Internal Medicine

## 2021-12-09 ENCOUNTER — Other Ambulatory Visit: Payer: Self-pay

## 2021-12-09 ENCOUNTER — Encounter: Payer: Self-pay | Admitting: Internal Medicine

## 2021-12-09 DIAGNOSIS — R5381 Other malaise: Secondary | ICD-10-CM | POA: Diagnosis not present

## 2021-12-09 DIAGNOSIS — B377 Candidal sepsis: Secondary | ICD-10-CM | POA: Diagnosis not present

## 2021-12-09 DIAGNOSIS — R7989 Other specified abnormal findings of blood chemistry: Secondary | ICD-10-CM | POA: Diagnosis not present

## 2021-12-09 NOTE — Assessment & Plan Note (Signed)
Checked prior to discharge and resolved, wnl ?

## 2021-12-09 NOTE — Assessment & Plan Note (Signed)
Now has completed treatment and no new concerns.  Resolved and no further treatment indicated. ?rtc as needed ?

## 2021-12-09 NOTE — Progress Notes (Signed)
? ?  Subjective:  ? ? Patient ID: Kyle Velazquez, male    DOB: 1955/10/26, 66 y.o.   MRN: [id number] ? ?HPI ?Here for hsfu ?He was hospitalized earlier this month for pneumonia and discharged but then called back with positive blood cultures with Candida parapsilosis and sensitivities noted and is sensitive to fluconazole. He underwent evaluation by ENT and ophthalmology and no issues.  TTE was concerning for a vegetation and follow up TEE negative for vegetation.  Placed on fluconazole for 2 weeks and now has completed that.  No fever, no chills.  No new concerns. Repeat blood cultures x 2 remained negative.  ? ? ?Review of Systems  ?Constitutional:  Negative for chills and fever.  ?Gastrointestinal:  Negative for diarrhea and nausea.  ?Skin:  Negative for rash.  ? ?   ?Objective:  ? Physical Exam ?Eyes:  ?   General: No scleral icterus. ?Pulmonary:  ?   Effort: Pulmonary effort is normal.  ?Skin: ?   Findings: No rash.  ?Neurological:  ?   Mental Status: He is alert.  ?Psychiatric:     ?   Mood and Affect: Mood normal.  ? ?SH: no current tobacco ? ? ? ?   ?Assessment & Plan:  ? ? ?

## 2021-12-09 NOTE — Assessment & Plan Note (Signed)
From recent hospitalization and advised to increase activity.  ?

## 2021-12-16 ENCOUNTER — Ambulatory Visit (INDEPENDENT_AMBULATORY_CARE_PROVIDER_SITE_OTHER): Payer: Medicare Other | Admitting: Podiatry

## 2021-12-16 ENCOUNTER — Encounter: Payer: Self-pay | Admitting: Podiatry

## 2021-12-16 DIAGNOSIS — E1149 Type 2 diabetes mellitus with other diabetic neurological complication: Secondary | ICD-10-CM | POA: Diagnosis not present

## 2021-12-16 DIAGNOSIS — L600 Ingrowing nail: Secondary | ICD-10-CM

## 2021-12-16 DIAGNOSIS — E1140 Type 2 diabetes mellitus with diabetic neuropathy, unspecified: Secondary | ICD-10-CM | POA: Diagnosis not present

## 2021-12-16 MED ORDER — GABAPENTIN 400 MG PO CAPS: 400.0000 mg | ORAL_CAPSULE | Freq: Three times a day (TID) | ORAL | 3 refills | Status: DC

## 2021-12-16 NOTE — Progress Notes (Signed)
Subjective:  ? ?Patient ID: Kyle Velazquez, male   DOB: 66 y.o.   MRN: [id number]  ? ?HPI ?Patient presents with a lot of tingling in his feet and also concerned about the possibility for ingrown toenails of the big toes both feet.  States his sugars been under reasonable control last A1c 6.8 has had a number of years ? ? ?Review of Systems  ?All other systems reviewed and are negative. ? ? ?   ?Objective:  ?Physical Exam ?Vitals and nursing note reviewed.  ?Constitutional:   ?   Appearance: He is well-developed.  ?Pulmonary:  ?   Effort: Pulmonary effort is normal.  ?Musculoskeletal:     ?   General: Normal range of motion.  ?Skin: ?   General: Skin is warm.  ?Neurological:  ?   Mental Status: He is alert.  ?  ?Neurovascular status found to be intact with patient noted to have thick nailbeds hallux bilateral with moderate pain on the medial side but appears most to be at night.  Also complains of a lot of burning shooting tingling pain at night that can last all night and is not currently taking medicine for this and has been diagnosed with neuropathic leg pathology.  Good digital perfusion well oriented x3 ? ?   ?Assessment:  ?Possibility for ingrown toenails of the big toes both feet with also possibility for neuropathy creating the symptoms he is experiencing diabetic in origin ? ?   ?Plan:  ?H&P reviewed all conditions.  Possible we will try to fix the ingrown's we will get a hold off currently and today have recommended gabapentin that I want him to start 1 at night and gradual can add 1 in the day 1 in the midday if needed.  Reappoint to recheck depending on how he responds to medication and for the possibility of ingrown toenail correction ?   ? ? ?

## 2021-12-29 ENCOUNTER — Other Ambulatory Visit: Payer: Self-pay

## 2021-12-29 MED ORDER — ALPRAZOLAM 1 MG PO TABS: 0.5000 mg | ORAL_TABLET | Freq: Every day | ORAL | 2 refills | Status: DC

## 2022-01-14 ENCOUNTER — Ambulatory Visit (INDEPENDENT_AMBULATORY_CARE_PROVIDER_SITE_OTHER): Payer: Medicare Other | Admitting: Podiatry

## 2022-01-14 DIAGNOSIS — L600 Ingrowing nail: Secondary | ICD-10-CM

## 2022-01-14 NOTE — Patient Instructions (Signed)

## 2022-01-14 NOTE — Progress Notes (Signed)
Subjective:  ? ?Patient ID: Kyle Velazquez, male   DOB: 66 y.o.   MRN: [id number]  ? ?HPI ?Patient presents with chronic ingrown toenails of both feet stating his sugars been stable and states that he gets a lot of soreness in both of his feet and would like to try to have them corrected for hopeful relief ? ? ?ROS ? ? ?   ?Objective:  ?Physical Exam  ?Neurovascular status intact with patient found to have incurvated medial borders of the hallux bilateral that are painful when pressed and are making shoe gear difficult with no active redness or drainage noted ? ?   ?Assessment:  ?Ingrown toenail deformity of the hallux bilateral medial borders with pain ? ?   ?Plan:  ?H&P reviewed condition allowed him to read consent form understanding risk and today I infiltrated each hallux 60 mg Xylocaine Marcaine mixture sterile prep done and using sterile instrumentation removed the medial borders exposed matrix applied phenol 3 applications 30 seconds followed by alcohol lavage sterile dressing gave instructions for soaks and to leave dressings on 24 hours but take them off earlier if throbbing were to occur.  Encouraged to call with questions concerns which may arise ?   ? ? ?

## 2022-01-21 ENCOUNTER — Encounter: Payer: Self-pay | Admitting: Podiatry

## 2022-01-21 ENCOUNTER — Ambulatory Visit (INDEPENDENT_AMBULATORY_CARE_PROVIDER_SITE_OTHER): Payer: Medicare Other | Admitting: Podiatry

## 2022-01-21 DIAGNOSIS — L03032 Cellulitis of left toe: Secondary | ICD-10-CM

## 2022-01-21 MED ORDER — DOXYCYCLINE HYCLATE 100 MG PO TABS: 100.0000 mg | ORAL_TABLET | Freq: Two times a day (BID) | ORAL | 1 refills | Status: DC

## 2022-01-21 NOTE — Progress Notes (Signed)
Subjective:  ? ?Patient ID: Kyle Velazquez, male   DOB: 66 y.o.   MRN: [id number]  ? ?HPI ?Patient presents concerned because he developed some redness in the proximal portion of his nail left over right and still been sore and he is a diabetic.  Patient has been taking care of it but has not been wearing Band-Aid when out and about neuro ? ? ?ROS ? ? ?   ?Objective:  ?Physical Exam  ?Vascular status intact with some redness in the proximal portion nailbed left over right hallux nail sites themselves are crusting over appear to be healing well ? ?   ?Assessment:  ?Possibility for low-grade paronychia infection localized with no proximal spread left hallux ? ?   ?Plan:  ?H&P explained condition to patient and at this point I have recommended that we go ahead and just put him on an antibiotic.  I put him on doxycycline for a week I explained soaks and explained bandage usage when he is out and about and letting it air dry at home.  This should heal uneventfully reappoint if any issues occur ?   ? ? ?

## 2022-02-08 NOTE — Progress Notes (Unsigned)
Cardiology Office Note:    Date:  02/09/2022   ID:  [name], DOB 05-25-1956, MRN [id number]  PCP:  Reynold Bowen, MD  [place] [facility] Cardiologist:  Mertie Moores, MD     Referring MD: Reynold Bowen, MD   Chief Complaint:  F/u for HTN    Patient Profile: Hypertension  Sinus bradycardia  Diabetes mellitus  Celiac disease Hyperlipidemia  Neuropathy  Anemia of chronic disease Anxiety  Prior CV Studies: Transesophageal echocardiogram 11/23/2021 EF 65-70, no RWMA, normal RVSF, mild BAE, no LAA clot, trivial MR  ECHO COMPLETE WO IMAGING ENHANCING AGENT 11/20/2021 EF 70-75, no RWMA, GRII DD, GLS -23, normal RVSF, mildly elevated PASP, moderate BAE, mild MR, small echodensity on the tricuspid valve, RVSP 36.4  History of Present Illness:   Kyle Velazquez is a 66 y.o. male with the above problem list.  He was last seen in January 2023 by Dr. Acie Fredrickson.  He was admitted 3/6-3/8 with pneumonia.  His blood cultures grew out fungus and he was readmitted 3/9-3/14.  Echocardiogram demonstrated normal LV function.  Vegetation could not be excluded.  TEE was negative for vegetation.  He was discharged on Diflucan.  He returns for routine Cardiology f/u.  He is doing well without chest pain, shortness of breath, syncope, near syncope, unusual fatigue, leg edema.       Past Medical History:  Diagnosis Date   Celiac disease    Chronic back pain    Chronic shoulder pain    Diabetes 1.5, managed as type 2 (HCC)    Diabetic neuropathy (HCC)    Hyperlipidemia    Hypertension    Internal hemorrhoids    Current Medications: Current Meds  Medication Sig   ALPRAZolam (XANAX) 1 MG tablet Take 0.5 tablets (0.5 mg total) by mouth 5 (five) times daily.   amLODipine (NORVASC) 10 MG tablet Take 5 mg by mouth daily.   betamethasone dipropionate 0.05 % cream Apply 1 drop topically 2 (two) times daily as needed (skin inflammation).   Continuous Blood Gluc Sensor (FREESTYLE LIBRE 14 DAY  SENSOR) MISC by Does not apply route.   doxycycline (VIBRA-TABS) 100 MG tablet Take 1 tablet (100 mg total) by mouth 2 (two) times daily.   fluticasone (FLONASE) 50 MCG/ACT nasal spray Place 2 sprays into both nostrils at bedtime as needed for allergies or rhinitis.   glucose blood test strip 1 each by Other route as needed for other. Use as instructed   hydrochlorothiazide (HYDRODIURIL) 25 MG tablet Take 1 tablet (25 mg total) by mouth daily. Patient needs appointment for any future refills.  Please call office at [phone] to schedule appointment. 1st attempt.   hydrocortisone 2.5 % cream Apply 1 application. topically 2 (two) times daily as needed (skin irritation).   insulin aspart (NOVOLOG) 100 UNIT/ML injection Inject 0-70 Units into the skin See admin instructions. Inject up to 70 units daily via pump on sliding scale   omeprazole (PRILOSEC) 40 MG capsule Take 40 mg by mouth daily.   PERCOCET 10-325 MG tablet Take 1 tablet by mouth every 6 (six) hours as needed for pain.   sertraline (ZOLOFT) 25 MG tablet TAKE 1 TABLET(25 MG) BY MOUTH DAILY   Testosterone 20.25 MG/ACT (1.62%) GEL Apply 2 Pump topically every other day.   valACYclovir (VALTREX) 500 MG tablet Take 500 mg by mouth daily.   valsartan (DIOVAN) 320 MG tablet Take 1 tablet (320 mg total) by mouth daily.    Allergies:   Patient has no known  allergies.   Social History   Tobacco Use   Smoking status: Former   Smokeless tobacco: Never  Substance Use Topics   Alcohol use: No   Drug use: No    Family Hx: The patient's family history includes CAD in his father and mother.  Review of Systems  Cardiovascular:  Negative for claudication.  see HPI  EKGs/Labs/Other Test Reviewed:    EKG:  EKG is   ordered today.  The ekg ordered today demonstrates sinus bradycardia, HR 45, normal axis, no ST-T wave changes, QTc 370, no change from prior tracing  Recent Labs: 11/24/2021: ALT 38; BUN 14; Creatinine, Ser 0.59; Hemoglobin  12.4; Magnesium 1.8; Platelets 638; Potassium 4.8; Sodium 132   Recent Lipid Panel No results for input(s): CHOL, TRIG, HDL, VLDL, LDLCALC, LDLDIRECT in the last 8760 hours.   Risk Assessment/Calculations:         Physical Exam:    VS:  BP 138/62   Pulse (!) 47   Ht 5\' 6"  (1.676 m)   Wt 142 lb 9.6 oz (64.7 kg)   SpO2 98%   BMI 23.02 kg/m     Wt Readings from Last 3 Encounters:  02/09/22 142 lb 9.6 oz (64.7 kg)  12/09/21 137 lb (62.1 kg)  11/23/21 138 lb 14.2 oz (63 kg)    Constitutional:      Appearance: Healthy appearance. Not in distress.  Neck:     Vascular: JVD normal.  Pulmonary:     Effort: Pulmonary effort is normal.     Breath sounds: No wheezing. No rales.  Cardiovascular:     Bradycardia present. Regular rhythm. Normal S1. Normal S2.      Murmurs: There is no murmur.  Edema:    Peripheral edema absent.  Abdominal:     Palpations: Abdomen is soft.  Skin:    General: Skin is warm and dry.  Neurological:     General: No focal deficit present.     Mental Status: Alert and oriented to person, place and time.     Cranial Nerves: Cranial nerves are intact.        ASSESSMENT & PLAN:   Essential hypertension Fair control.  BP is usually < 140 at home.  We discussed limiting Na as much as possible.  He will continue to monitor BPs at home and let us know if his BP is consistently > 140 systolic.  Continue Amlodipine 10 mg once daily, HCTZ 25 mg once daily, Valsartan 320 mg once daily.  K+ was high in the hospital and his K+ supplement was DCd.  Check f/u BMET today.  F/u with Dr. Elease HashimotoNahser in 6 mos.   Hyperlipidemia Managed by PCP.  [facility] and interpreted - 03/24/2021: HDL 100, LDL 67, Triglycerides 64.  Lipid levels are excellent.  Pt is not currently on antihyperlipidemic Rx.   Bradycardia Asymptomatic.  He has had heart rates in the 40s and 50s over the years.  He exercises on a regular basis.  No further testing needed at this time.   We discussed symptoms that would prompt earlier follow-up.           Dispo:  Return in about 6 months (around 08/12/2022) for Routine Follow Up, w/ Dr. Elease HashimotoNahser.   Medication Adjustments/Labs and Tests Ordered: Current medicines are reviewed at length with the patient today.  Concerns regarding medicines are outlined above.  Tests Ordered: Orders Placed This Encounter  Procedures   Basic metabolic panel  EKG 12-Lead   Medication Changes: No orders of the defined types were placed in this encounter.  Signed, Richardson Dopp, PA-C  02/09/2022 11:51 AM    [facility] [place], [place], [place]  [address] Phone: [phone]; Fax: [phone]

## 2022-02-09 ENCOUNTER — Encounter: Payer: Self-pay | Admitting: Physician Assistant

## 2022-02-09 ENCOUNTER — Ambulatory Visit (INDEPENDENT_AMBULATORY_CARE_PROVIDER_SITE_OTHER): Payer: Medicare Other | Admitting: Physician Assistant

## 2022-02-09 VITALS — BP 138/62 | HR 47 | Ht 66.0 in | Wt 142.6 lb

## 2022-02-09 DIAGNOSIS — E785 Hyperlipidemia, unspecified: Secondary | ICD-10-CM | POA: Diagnosis not present

## 2022-02-09 DIAGNOSIS — R001 Bradycardia, unspecified: Secondary | ICD-10-CM

## 2022-02-09 DIAGNOSIS — I10 Essential (primary) hypertension: Secondary | ICD-10-CM | POA: Diagnosis not present

## 2022-02-09 NOTE — Patient Instructions (Signed)
Medication Instructions:  Your physician recommends that you continue on your current medications as directed. Please refer to the Current Medication list given to you today.  *If you need a refill on your cardiac medications before your next appointment, please call your pharmacy*   Lab Work: TODAY:  BMET  If you have labs (blood work) drawn today and your tests are completely normal, you will receive your results only by: [place] (if you have MyChart) OR A paper copy in the mail If you have any lab test that is abnormal or we need to change your treatment, we will call you to review the results.   Testing/Procedures: None ordered   Follow-Up: At [facility], you and your health needs are our priority.  As part of our continuing mission to provide you with exceptional heart care, we have created designated Provider Care Teams.  These Care Teams include your primary Cardiologist (physician) and Advanced Practice Providers (APPs -  Physician Assistants and Nurse Practitioners) who all work together to provide you with the care you need, when you need it.  We recommend signing up for the patient portal called "MyChart".  Sign up information is provided on this After Visit Summary.  MyChart is used to connect with patients for Virtual Visits (Telemedicine).  Patients are able to view lab/test results, encounter notes, upcoming appointments, etc.  Non-urgent messages can be sent to your provider as well.   To learn more about what you can do with MyChart, go to [email or web address].    Your next appointment:   6 month(s)  The format for your next appointment:   In Person  Provider:   Mertie Moores, MD     Other Instructions   Important Information About Sugar

## 2022-02-09 NOTE — Assessment & Plan Note (Signed)
Managed by PCP.  Labs from [facility] personally reviewed and interpreted - 03/24/2021: HDL 100, LDL 67, Triglycerides 64.  Lipid levels are excellent.  Pt is not currently on antihyperlipidemic Rx.

## 2022-02-09 NOTE — Assessment & Plan Note (Signed)
Fair control.  BP is usually < 140 at home.  We discussed limiting Na as much as possible.  He will continue to monitor BPs at home and let us know if his BP is consistently > [id number] systolic.  Continue Amlodipine 10 mg once daily, HCTZ 25 mg once daily, Valsartan 320 mg once daily.  K+ was high in the hospital and his K+ supplement was DCd.  Check f/u BMET today.  F/u with Dr. Acie Fredrickson in 6 mos.

## 2022-02-09 NOTE — Assessment & Plan Note (Signed)
Asymptomatic.  He has had heart rates in the 40s and 50s over the years.  He exercises on a regular basis.  No further testing needed at this time.  We discussed symptoms that would prompt earlier follow-up.

## 2022-02-10 LAB — BASIC METABOLIC PANEL
BUN/Creatinine Ratio: 15 (ref 10–24)
BUN: 12 mg/dL (ref 8–27)
CO2: 28 mmol/L (ref 20–29)
Calcium: 10.1 mg/dL (ref 8.6–10.2)
Chloride: 95 mmol/L — ABNORMAL LOW (ref 96–106)
Creatinine, Ser: 0.78 mg/dL (ref 0.76–1.27)
Glucose: 118 mg/dL — ABNORMAL HIGH (ref 70–99)
Potassium: 4.4 mmol/L (ref 3.5–5.2)
Sodium: 134 mmol/L (ref 134–144)
eGFR: 98 mL/min/{1.73_m2} (ref >59)

## 2022-02-10 NOTE — Progress Notes (Signed)
Pt has been made aware of normal result and verbalized understanding.  jw

## 2022-03-18 ENCOUNTER — Ambulatory Visit: Payer: Medicare Other | Admitting: Psychiatry

## 2022-03-18 NOTE — Progress Notes (Signed)
NSNC 

## 2022-04-15 ENCOUNTER — Encounter: Payer: Self-pay | Admitting: Psychiatry

## 2022-04-15 ENCOUNTER — Telehealth (INDEPENDENT_AMBULATORY_CARE_PROVIDER_SITE_OTHER): Payer: Medicare Other | Admitting: Psychiatry

## 2022-04-15 DIAGNOSIS — F411 Generalized anxiety disorder: Secondary | ICD-10-CM

## 2022-04-15 MED ORDER — SERTRALINE HCL 25 MG PO TABS: ORAL_TABLET | ORAL | 3 refills | Status: DC

## 2022-04-15 MED ORDER — ALPRAZOLAM 1 MG PO TABS: 0.5000 mg | ORAL_TABLET | Freq: Every day | ORAL | 5 refills | Status: DC

## 2022-04-15 NOTE — Progress Notes (Signed)
Kyle Velazquez [id number] [birth year] 66 y.o.  Virtual Visit via Telephone Note  I connected with pt by telephone and verified that I am speaking with the correct person using two identifiers.   I discussed the limitations, risks, security and privacy concerns of performing an evaluation and management service by telephone and the availability of in person appointments. I also discussed with the patient that there may be a patient responsible charge related to this service. The patient expressed understanding and agreed to proceed.  I discussed the assessment and treatment plan with the patient. The patient was provided an opportunity to ask questions and all were answered. The patient agreed with the plan and demonstrated an understanding of the instructions.   The patient was advised to call back or seek an in-person evaluation if the symptoms worsen or if the condition fails to improve as anticipated.  I provided 15 minutes of non-face-to-face time during this encounter. The call started at 330 and ended at 3:45 PM. The patient was located at car and the provider was located office.   Subjective:   Patient ID:  Kyle Velazquez is a 66 y.o. (DOB 1955-12-05) male.  Chief Complaint:  Chief Complaint  Patient presents with   Follow-up   Anxiety    HPI [name] presents to the office today for follow-up of GAD.  seen 10/2019.  No meds were changed.  04/15/2020 appt with the following noted: Good summer.   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   A lot less stress. Worked out well.  No black cloud on Sunday afternoons anymore.   [name] is good.  GD in [place]. D [name] and her BF in [place] moved back to [place].  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   [name]  just turned 68 yo and they moved to [place].  Got to see kids at holidays.  Son doing really well with big promotion.   No other complaints.  Doesn't have control over  the time line.  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  Satisfied with meds.   Down to 2 and 1/2 tablets of Xanax daily from 4 and 1/2 tablets daily. Anxiety manageable.  Chronic issues.  Does well with the meds.  Aware of risks the Bz.   Patient reports stable mood and denies depressed or irritable moods.  Patient denies any recent difficulty with anxiety.  Patient denies difficulty with sleep initiation or maintenance. Denies appetite disturbance.  Patient reports that energy and motivation have been good.  Patient denies any difficulty with concentration.  Patient denies any suicidal ideation.   12/15/2020 appt noted: Doing great in retirement.  So great to have that off him.  Ready to retire this time.  No complaints.  No black clouds. No problems with meds. Patient reports stable mood and denies depressed or irritable moods.  Patient denies any recent difficulty with anxiety.  Patient denies difficulty with sleep initiation or maintenance. Denies appetite disturbance.  Patient reports that energy and motivation have been good.  Patient denies any difficulty with concentration.  Patient denies any suicidal ideation. Satisfied with sertraline 25 mg.  Celiac dx for 3 years.If eats clean GI is OK.  So not related to sertraline. Plan: Therefore no med changes today but consider stoppting sertraline at FU  06/16/21 appt noted: Good summer.  Son moved to [place].  D pregnant.  Did [place]  trip.  Celiac dz is a problem eating.  Been to [place] and enjoyed it. Things doing well.  Loves being retired.  Helping a little in work. Anxiety is good.  Patient reports stable mood and denies depressed or irritable moods.  Patient denies any recent difficulty with anxiety.  Patient denies difficulty with sleep initiation or maintenance. Denies appetite disturbance.  Patient reports that energy and motivation have been good.  Patient denies any difficulty with concentration.  Patient denies any suicidal  ideation. Don't see reason to go off or up on meds.  04/15/22 appt noted: Martin Majestic to [place]. Able to enjoy the trip. [place] home.  [name] got sick coming home. She still has Covid cough.  Gets fevers and they leave 3 weeks later.    He got sick a couple of days later. He's over it.   Anxiety is manageable. Helping [name] with [employer]. GS new one is 84 mos old.  Gets to see them often. Couple more trips planned.  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    No SE with meds.  Past Psychiatric Medication Trials: Sertraline, Lexapro, buspirone, Xanax with a long history of 1 mg 4 times daily.  He has been able to reduce it to half a milligram 5 times daily Under my psychiatric care since 2002  Review of Systems:  Review of Systems  Cardiovascular:  Negative for palpitations.  Neurological:  Negative for tremors.  Psychiatric/Behavioral:  Negative for agitation, behavioral problems, confusion, decreased concentration, dysphoric mood, hallucinations, self-injury, sleep disturbance and suicidal ideas. The patient is not nervous/anxious and is not hyperactive.     Medications: I have reviewed the patient's current medications.  Current Outpatient Medications  Medication Sig Dispense Refill   amLODipine (NORVASC) 10 MG tablet Take 5 mg by mouth daily.     Continuous Blood Gluc Sensor (FREESTYLE LIBRE 14 DAY SENSOR) MISC by Does not apply route.     doxycycline (VIBRA-TABS) 100 MG tablet Take 1 tablet (100 mg total) by mouth 2 (two) times daily. 15 tablet 1   fluticasone (FLONASE) 50 MCG/ACT nasal spray Place 2 sprays into both nostrils at bedtime as needed for allergies or rhinitis.     glucose blood test strip 1 each by Other route as needed for other. Use as instructed     hydrochlorothiazide (HYDRODIURIL) 25 MG tablet Take 1 tablet (25 mg total) by mouth daily. Patient needs appointment for any future refills.  Please call office at [phone] to  schedule appointment. 1st attempt. 90 tablet 3   hydrocortisone 2.5 % cream Apply 1 application. topically 2 (two) times daily as needed (skin irritation).     insulin aspart (NOVOLOG) 100 UNIT/ML injection Inject 0-70 Units into the skin See admin instructions. Inject up to 70 units daily via pump on sliding scale     omeprazole (PRILOSEC) 40 MG capsule Take 40 mg by mouth daily.     PERCOCET 10-325 MG tablet Take 1 tablet by mouth every 6 (six) hours as needed for pain.  0   Testosterone 20.25 MG/ACT (1.62%) GEL Apply 2 Pump topically every other day.     valACYclovir (VALTREX) 500 MG tablet Take 500 mg by mouth daily.     valsartan (DIOVAN) 320 MG tablet Take 1 tablet (320 mg total) by mouth daily. 90 tablet 3   ALPRAZolam (XANAX) 1 MG tablet Take 0.5 tablets (0.5 mg total) by mouth 5 (five) times daily. 75 tablet 5  betamethasone dipropionate 0.05 % cream Apply 1 drop topically 2 (two) times daily as needed (skin inflammation).     sertraline (ZOLOFT) 25 MG tablet TAKE 1 TABLET(25 MG) BY MOUTH DAILY 90 tablet 3   No current facility-administered medications for this visit.    Medication Side Effects: None; no sedation.  Allergies: No Known Allergies  Past Medical History:  Diagnosis Date   Celiac disease    Chronic back pain    Chronic shoulder pain    Diabetes 1.5, managed as type 2 (HCC)    Diabetic neuropathy (HCC)    Hyperlipidemia    Hypertension    Internal hemorrhoids     Family History  Problem Relation Age of Onset   CAD Mother    CAD Father     Social History   Socioeconomic History   Marital status: Married    Spouse name: Not on file   Number of children: Not on file   Years of education: Not on file   Highest education level: Not on file  Occupational History   Not on file  Tobacco Use   Smoking status: Former   Smokeless tobacco: Never  Substance and Sexual Activity   Alcohol use: No   Drug use: No   Sexual activity: Yes  Other Topics Concern    Not on file  Social History Narrative   Not on file   Social Determinants of Health   Financial Resource Strain: Not on file  Food Insecurity: Not on file  Transportation Needs: Not on file  Physical Activity: Not on file  Stress: Not on file  Social Connections: Not on file  Intimate Partner Violence: Not on file    Past Medical History, Surgical history, Social history, and Family history were reviewed and updated as appropriate.   Please see review of systems for further details on the patient's review from today.   Objective:   Physical Exam:  There were no vitals taken for this visit.  Physical Exam Constitutional:      General: He is not in acute distress. Musculoskeletal:        General: No deformity.  Neurological:     Mental Status: He is alert and oriented to person, place, and time.     Cranial Nerves: No dysarthria.     Coordination: Coordination normal.  Psychiatric:        Attention and Perception: Attention and perception normal. He does not perceive auditory or visual hallucinations.        Mood and Affect: Mood normal. Mood is not anxious or depressed. Affect is not labile, blunt or angry.        Speech: Speech normal.        Behavior: Behavior normal. Behavior is cooperative.        Thought Content: Thought content normal. Thought content is not paranoid or delusional. Thought content does not include homicidal or suicidal ideation. Thought content does not include suicidal plan.        Cognition and Memory: Cognition and memory normal.        Judgment: Judgment normal.     Comments: Insight intact Further reduction in anxiety with the pending end of job stress.     Lab Review:     Component Value Date/Time   NA 134 02/09/2022 1150   K 4.4 02/09/2022 1150   CL 95 (L) 02/09/2022 1150   CO2 28 02/09/2022 1150   GLUCOSE 118 (H) 02/09/2022 1150   GLUCOSE 195 (H) 11/24/2021  0457   BUN 12 02/09/2022 1150   CREATININE 0.78 02/09/2022 1150    CALCIUM 10.1 02/09/2022 1150   PROT 7.9 11/24/2021 0457   ALBUMIN 2.8 (L) 11/24/2021 0457   AST 28 11/24/2021 0457   ALT 38 11/24/2021 0457   ALKPHOS 161 (H) 11/24/2021 0457   BILITOT 0.6 11/24/2021 0457   GFRNONAA >60 11/24/2021 0457   GFRAA 113 01/15/2020 0939       Component Value Date/Time   WBC 8.0 11/24/2021 0457   RBC 3.94 (L) 11/24/2021 0457   HGB 12.4 (L) 11/24/2021 0457   HCT 36.9 (L) 11/24/2021 0457   PLT 638 (H) 11/24/2021 0457   MCV 93.7 11/24/2021 0457   MCH 31.5 11/24/2021 0457   MCHC 33.6 11/24/2021 0457   RDW 14.1 11/24/2021 0457   LYMPHSABS 1.8 11/24/2021 0457   MONOABS 0.8 11/24/2021 0457   EOSABS 0.4 11/24/2021 0457   BASOSABS 0.1 11/24/2021 0457    No results found for: "POCLITH", "LITHIUM"   No results found for: "PHENYTOIN", "PHENOBARB", "VALPROATE", "CBMZ"   .res Assessment: Plan:    Johney was seen today for follow-up and anxiety.  Diagnoses and all orders for this visit:  Generalized anxiety disorder -     ALPRAZolam (XANAX) 1 MG tablet; Take 0.5 tablets (0.5 mg total) by mouth 5 (five) times daily. -     sertraline (ZOLOFT) 25 MG tablet; TAKE 1 TABLET(25 MG) BY MOUTH DAILY   We discussed the short-term risks associated with benzodiazepines including sedation and increased fall risk among others.  Discussed long-term side effect risk including dependence, potential withdrawal symptoms, and the potential eventual dose-related risk of dementia.  But recent studies from 2020 dispute this association between benzodiazepines and dementia risk. Newer studies in 2020 do not support an association with dementia. Disc added risks in combo with opiates.  He's having no SE and doesn't want to change  Benefit from meds.  No complaints.  Tolerating.  Benefit with sertraline and satisfied.  Discussed the pros and cons of potentially weaning the sertraline at his next visit.  He does not feel that he can wean the alprazolam with or without sertraline  presents.  He did well for years with just alprazolam until business stress got out of hand.  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   Therefore no med changes today  Continue sertraline 25 mg daily Xanax 0.5 mg 5 times daily  FU 12 mos bc stability  Meredith Staggers, MD, DFAPA   Please see After Visit Summary for patient specific instructions.  Future Appointments  Date Time Provider Department Center  08/02/2022  9:20 AM Nahser, Deloris Ping, MD [id number]     No orders of the defined types were placed in this encounter.     -------------------------------

## 2022-08-01 ENCOUNTER — Encounter: Payer: Self-pay | Admitting: Cardiovascular Disease

## 2022-08-01 NOTE — Progress Notes (Unsigned)
Cardiology Office Note:    Date:  08/02/2022   ID:  Kyle Velazquez, DOB 05-09-1956, MRN [id number]  PCP:  Reynold Bowen, MD  Cardiologist:  Shaw Dobek  Electrophysiologist:  None   Referring MD: Reynold Bowen, MD   Chief Complaint  Patient presents with   Hypertension        Hyperlipidemia    Previous notes:   Kyle Velazquez is a 66 y.o. male with a hx of hypertension .  I saw him via telemedicine last year.  He has been exercising regularly . Exercised regularly   Did have 1 episode of DOE while walking to his car - a month ago  Has not occurred since. No CP.  Jan. 4, 2023 Kyle Velazquez is seen for follow up of his HTN. BP is a bit elevated.  120-160 at home Has celiac and DM so he eats lots of soup  ( makes his own soup)  BP is a bit elevated  Will DC ramipril Start valsartan 160 mg  a day    Nov. 20, 2023 Kyle Velazquez is seen for follow up of his HTN  Exercises daily  No CP , no dysnea  Some ED issues  Will give printed script for sildenafil 100 mg  Encouraged him to use [facility]     Past Medical History:  Diagnosis Date   Celiac disease    Chronic back pain    Chronic shoulder pain    Diabetes 1.5, managed as type 2 ([place])    Diabetic neuropathy ([place])    Hyperlipidemia    Hypertension    Internal hemorrhoids     Past Surgical History:  Procedure Laterality Date   COLONOSCOPY  2007 2016   Dr Earlean Shawl    ESOPHAGOGASTRODUODENOSCOPY  2011   Dr [facility]    NECK SURGERY     SHOULDER SURGERY  2009   TEE WITHOUT CARDIOVERSION N/A 11/23/2021   Procedure: TRANSESOPHAGEAL ECHOCARDIOGRAM (TEE);  Surgeon: Donato Heinz, MD;  Location: [facility] ENDOSCOPY;  Service: Cardiovascular;  Laterality: N/A;    Current Medications: Current Meds  Medication Sig   ALPRAZolam (XANAX) 1 MG tablet Take 0.5 tablets (0.5 mg total) by mouth 5 (five) times daily.   amLODipine (NORVASC) 10 MG tablet Take 5 mg by mouth daily.   Continuous Blood Gluc Sensor (FREESTYLE LIBRE 14 DAY  SENSOR) MISC by Does not apply route.   doxycycline (VIBRA-TABS) 100 MG tablet Take 1 tablet (100 mg total) by mouth 2 (two) times daily.   fluticasone (FLONASE) 50 MCG/ACT nasal spray Place 2 sprays into both nostrils at bedtime as needed for allergies or rhinitis.   glucose blood test strip 1 each by Other route as needed for other. Use as instructed   hydrochlorothiazide (HYDRODIURIL) 25 MG tablet Take 1 tablet (25 mg total) by mouth daily. Patient needs appointment for any future refills.  Please call office at [phone] to schedule appointment. 1st attempt.   hydrocortisone 2.5 % cream Apply 1 application. topically 2 (two) times daily as needed (skin irritation).   insulin aspart (NOVOLOG) 100 UNIT/ML injection Inject 0-70 Units into the skin See admin instructions. Inject up to 70 units daily via pump on sliding scale   omeprazole (PRILOSEC) 40 MG capsule Take 40 mg by mouth daily.   PERCOCET 10-325 MG tablet Take 1 tablet by mouth every 6 (six) hours as needed for pain.   sertraline (ZOLOFT) 25 MG tablet TAKE 1 TABLET(25 MG) BY MOUTH DAILY   sildenafil (VIAGRA) 100 MG tablet Take  1 tablet (100 mg total) by mouth daily as needed for erectile dysfunction.   Testosterone 20.25 MG/ACT (1.62%) GEL Apply 2 Pump topically every other day.   valACYclovir (VALTREX) 500 MG tablet Take 500 mg by mouth daily.   valsartan (DIOVAN) 320 MG tablet Take 1 tablet (320 mg total) by mouth daily.     Allergies:   Patient has no known allergies.   Social History   Socioeconomic History   Marital status: Married    Spouse name: Not on file   Number of children: Not on file   Years of education: Not on file   Highest education level: Not on file  Occupational History   Not on file  Tobacco Use   Smoking status: Former   Smokeless tobacco: Never  Substance and Sexual Activity   Alcohol use: No   Drug use: No   Sexual activity: Yes  Other Topics Concern   Not on file  Social History Narrative    Not on file   Social Determinants of Health   Financial Resource Strain: Not on file  Food Insecurity: Not on file  Transportation Needs: Not on file  Physical Activity: Not on file  Stress: Not on file  Social Connections: Not on file     Family History: The patient's family history includes CAD in his father and mother.  ROS:   Please see the history of present illness.     All other systems reviewed and are negative.  EKGs/Labs/Other Studies Reviewed:    The following studies were reviewed today:   Recent Labs: 11/24/2021: ALT 38; Hemoglobin 12.4; Magnesium 1.8; Platelets 638 02/09/2022: BUN 12; Creatinine, Ser 0.78; Potassium 4.4; Sodium 134  Recent Lipid Panel No results found for: "CHOL", "TRIG", "HDL", "CHOLHDL", "VLDL", "LDLCALC", "LDLDIRECT"  Physical Exam:    Physical Exam: Blood pressure 132/68, pulse (!) 53, height 5' 6"  (1.676 m), weight 145 lb 12.8 oz (66.1 kg), SpO2 99 %.       GEN:  thin,  middle age male  in no acute distress HEENT: Normal NECK: No JVD; No carotid bruits LYMPHATICS: No lymphadenopathy CARDIAC: RRR , no murmurs, rubs, gallops RESPIRATORY:  Clear to auscultation without rales, wheezing or rhonchi  ABDOMEN: Soft, non-tender, non-distended MUSCULOSKELETAL:  No edema; No deformity  SKIN: Warm and dry NEUROLOGIC:  Alert and oriented x 3    EKG:    ASSESSMENT:    1. Essential hypertension   2. Erectile dysfunction, unspecified erectile dysfunction type      PLAN:      HTN:   BP is well controlled. .  2.  ED :   will write script for Sildenafil 100 mg day       Medication Adjustments/Labs and Tests Ordered: Current medicines are reviewed at length with the patient today.  Concerns regarding medicines are outlined above.  No orders of the defined types were placed in this encounter.  Meds ordered this encounter  Medications   sildenafil (VIAGRA) 100 MG tablet    Sig: Take 1 tablet (100 mg total) by mouth daily as  needed for erectile dysfunction.    Dispense:  30 tablet    Refill:  3     Patient Instructions  Medication Instructions:  RX fo Sildenafil 129m #30, printed *If you need a refill on your cardiac medications before your next appointment, please call your pharmacy*   Lab Work: NONE If you have labs (blood work) drawn today and your tests are completely normal, you  will receive your results only by: [place] (if you have MyChart) OR A paper copy in the mail If you have any lab test that is abnormal or we need to change your treatment, we will call you to review the results.   Testing/Procedures: NONE   Follow-Up: At [facility], you and your health needs are our priority.  As part of our continuing mission to provide you with exceptional heart care, we have created designated Provider Care Teams.  These Care Teams include your primary Cardiologist (physician) and Advanced Practice Providers (APPs -  Physician Assistants and Nurse Practitioners) who all work together to provide you with the care you need, when you need it.  We recommend signing up for the patient portal called "MyChart".  Sign up information is provided on this After Visit Summary.  MyChart is used to connect with patients for Virtual Visits (Telemedicine).  Patients are able to view lab/test results, encounter notes, upcoming appointments, etc.  Non-urgent messages can be sent to your provider as well.   To learn more about what you can do with MyChart, go to [email or web address].    Your next appointment:   1 year(s)  The format for your next appointment:   In Person  Provider:   Mertie Moores, MD       Important Information About Sugar         Signed, Mertie Moores, MD  08/02/2022 5:47 PM    [place]

## 2022-08-02 ENCOUNTER — Encounter: Payer: Self-pay | Admitting: Cardiovascular Disease

## 2022-08-02 ENCOUNTER — Ambulatory Visit: Payer: Medicare Other | Attending: Cardiovascular Disease | Admitting: Cardiovascular Disease

## 2022-08-02 VITALS — BP 132/68 | HR 53 | Ht 66.0 in | Wt 145.8 lb

## 2022-08-02 DIAGNOSIS — N529 Male erectile dysfunction, unspecified: Secondary | ICD-10-CM | POA: Insufficient documentation

## 2022-08-02 DIAGNOSIS — I10 Essential (primary) hypertension: Secondary | ICD-10-CM | POA: Diagnosis not present

## 2022-08-02 MED ORDER — SILDENAFIL CITRATE 100 MG PO TABS
100.0000 mg | ORAL_TABLET | Freq: Every day | ORAL | 3 refills | Status: AC | PRN
Start: 2022-08-02 — End: ?

## 2022-08-02 NOTE — Patient Instructions (Signed)
Medication Instructions:  RX fo Sildenafil 184m #30, printed *If you need a refill on your cardiac medications before your next appointment, please call your pharmacy*   Lab Work: NONE If you have labs (blood work) drawn today and your tests are completely normal, you will receive your results only by: [place](if you have MyChart) OR A paper copy in the mail If you have any lab test that is abnormal or we need to change your treatment, we will call you to review the results.   Testing/Procedures: NONE   Follow-Up: [facility] you and your health needs are our priority.  As part of our continuing mission to provide you with exceptional heart care, we have created designated Provider Care Teams.  These Care Teams include your primary Cardiologist (physician) and Advanced Practice Providers (APPs -  Physician Assistants and Nurse Practitioners) who all work together to provide you with the care you need, when you need it.  We recommend signing up for the patient portal called "MyChart".  Sign up information is provided on this After Visit Summary.  MyChart is used to connect with patients for Virtual Visits (Telemedicine).  Patients are able to view lab/test results, encounter notes, upcoming appointments, etc.  Non-urgent messages can be sent to your provider as well.   To learn more about what you can do with MyChart, go to [email or web address]    Your next appointment:   1 year(s)  The format for your next appointment:   In Person  Provider:   PMertie Moores MD       Important Information About Sugar

## 2022-09-29 ENCOUNTER — Encounter: Payer: Self-pay | Admitting: Podiatry

## 2022-09-29 ENCOUNTER — Ambulatory Visit (INDEPENDENT_AMBULATORY_CARE_PROVIDER_SITE_OTHER): Payer: Medicare Other | Admitting: Podiatry

## 2022-09-29 DIAGNOSIS — L600 Ingrowing nail: Secondary | ICD-10-CM

## 2022-09-29 DIAGNOSIS — L03032 Cellulitis of left toe: Secondary | ICD-10-CM | POA: Diagnosis not present

## 2022-09-29 DIAGNOSIS — B351 Tinea unguium: Secondary | ICD-10-CM

## 2022-09-29 MED ORDER — TERBINAFINE HCL 250 MG PO TABS: ORAL_TABLET | ORAL | 0 refills | Status: DC

## 2022-09-29 NOTE — Progress Notes (Signed)
Subjective:   Patient ID: Kyle Velazquez, male   DOB: 67 y.o.   MRN: [id number]   HPI Patient presents concerned because he has developed a lot of discomfort in his big toenails with discoloration and looseness of the beds and he is concerned because of his long-term diabetes   ROS      Objective:  Physical Exam  Neurovascular status intact with patient having what appears to be damage to the hallux nails discoloration was noticed slight proximal erythema but no drainage     Assessment:  Difficult to say between inflammation trauma versus fungus or a combination of the 2     Plan:  H&P reviewed this with him at great length debrided out nail bed did not note any obvious drainage and explained the usage of a oral Dosepak consisting of 7 antifungal pills a month for 4 months.  May require permanent removal or temporary removal of nails completely if symptoms do not get better with what we have done so far

## 2022-09-29 NOTE — Patient Instructions (Signed)

## 2022-11-01 ENCOUNTER — Other Ambulatory Visit: Payer: Self-pay

## 2022-11-01 ENCOUNTER — Telehealth: Payer: Self-pay | Admitting: Psychiatry

## 2022-11-01 DIAGNOSIS — F411 Generalized anxiety disorder: Secondary | ICD-10-CM

## 2022-11-01 MED ORDER — ALPRAZOLAM 1 MG PO TABS: 0.5000 mg | ORAL_TABLET | Freq: Every day | ORAL | 5 refills | Status: DC

## 2022-11-01 NOTE — Telephone Encounter (Signed)
Pended.

## 2022-11-01 NOTE — Telephone Encounter (Signed)
Patient called in for refill on Alprazolam 75m. Ph: [phone] [facility] [address] GYork Spaniel

## 2022-12-06 ENCOUNTER — Other Ambulatory Visit: Payer: Self-pay | Admitting: Podiatry

## 2022-12-23 ENCOUNTER — Encounter: Payer: Self-pay | Admitting: Podiatry

## 2022-12-23 ENCOUNTER — Ambulatory Visit (INDEPENDENT_AMBULATORY_CARE_PROVIDER_SITE_OTHER): Payer: Medicare Other | Admitting: Podiatry

## 2022-12-23 DIAGNOSIS — L603 Nail dystrophy: Secondary | ICD-10-CM

## 2022-12-23 MED ORDER — GABAPENTIN 100 MG PO CAPS: 100.0000 mg | ORAL_CAPSULE | Freq: Three times a day (TID) | ORAL | 1 refills | Status: DC

## 2022-12-23 NOTE — Progress Notes (Signed)
He presents today for follow-up with me he is seeing Dr. Charlsie Merles in the past 2 cut his nails back and remove them at 1 time.  He states that since that time approximately a year ago he has had severe pain to the toes and is unable to ambulate normally.  He is diabetic states that his hemoglobin A1c recently was at 6.8.  Objective: Vital signs are stable he is alert and oriented x 3.  Pulses are palpable.  Neurologic sensorium is questionable as to whether or not that is beginning to develop neuropathy to the hallux right otherwise the remainder of the plantar foot has normal sensorium.  He does have tenderness at the tip of the toe and at the interphalangeal joint but not necessarily on palpation of the nail plate.  Assessment: Diabetes with most likely diabetic peripheral neuropathy.  Plan: Started him on gabapentin 100 mg 1 p.o. twice daily and I like to follow-up with him in 1 month for med check and to reevaluate.  May need to consider neurology.

## 2023-01-20 ENCOUNTER — Ambulatory Visit (INDEPENDENT_AMBULATORY_CARE_PROVIDER_SITE_OTHER): Payer: Medicare Other | Admitting: Podiatry

## 2023-01-20 DIAGNOSIS — G5790 Unspecified mononeuropathy of unspecified lower limb: Secondary | ICD-10-CM | POA: Diagnosis not present

## 2023-01-20 MED ORDER — PREGABALIN 50 MG PO CAPS: 50.0000 mg | ORAL_CAPSULE | Freq: Three times a day (TID) | ORAL | 3 refills | Status: DC

## 2023-01-20 NOTE — Progress Notes (Signed)
He presents today states that the gabapentin we provided him for the pain in his toes really did not help it actually caused his skin to feel like it was crawling.  His pain level in the hallux bilaterally is still an 8 out of 10 and states that this was caused by injections from toenail removal.  States his hemoglobin A1c is 7.1 circulating blood sugar is 146 mg/dL.  Objective: No change in physical exam no erythema Dem salines drainage odor pulses remain palpable.  Assessment: Possibly traumatic neuropathy or diabetic neuropathy.  Just to the hallux bilateral.  Plan: Obviously discontinuing the gabapentin starting with Lyrica 50 mg 1 p.o. twice daily and I will follow-up with him in 1 month.  This will be for med mobic.  He is questioning if he wants to discontinue medication he was [place].  But he is notify us.  Should he call requesting referral we will do that.

## 2023-02-22 ENCOUNTER — Encounter: Payer: Self-pay | Admitting: Gastroenterology

## 2023-02-22 ENCOUNTER — Ambulatory Visit (INDEPENDENT_AMBULATORY_CARE_PROVIDER_SITE_OTHER): Payer: Medicare Other | Admitting: Gastroenterology

## 2023-02-22 VITALS — BP 134/58 | HR 53 | Ht 67.0 in | Wt 142.0 lb

## 2023-02-22 DIAGNOSIS — R1084 Generalized abdominal pain: Secondary | ICD-10-CM

## 2023-02-22 DIAGNOSIS — R194 Change in bowel habit: Secondary | ICD-10-CM | POA: Diagnosis not present

## 2023-02-22 DIAGNOSIS — K900 Celiac disease: Secondary | ICD-10-CM | POA: Diagnosis not present

## 2023-02-22 MED ORDER — NA SULFATE-K SULFATE-MG SULF 17.5-3.13-1.6 GM/177ML PO SOLN: 1.0000 | Freq: Once | ORAL | 0 refills | Status: DC

## 2023-02-22 NOTE — Progress Notes (Deleted)
[facility] Consult Note:  History: Kyle Velazquez 02/22/2023  Referring provider: Adrian Prince, MD  Reason for consult/chief complaint: No chief complaint on file.   Subjective  HPI: Summary of pertinent GI issues:  Kyle Velazquez saw me in clinic February 2022 transferring care after retirement of Dr. Kinnie Scales.  He has biopsy-proven celiac sprue and does his best to maintain a gluten-free diet.  When he transferred care to me, he was describing chronic bloated abdominal discomfort and loose stool, and had undergone colonoscopy Dr. Kinnie Scales shortly before that.  Patient did not have the opportunity to share the clinical history with Dr. Kinnie Scales, no biopsies were taken during that procedure. October 2021 colonoscopy with Dr. Kinnie Scales, subcentimeter polyp removed, no pathology report available. 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 Myrtie Neither, considerations were maldigestion, IBS, SIBO, microscopic colitis.  Patient given some written dietary advice and a trial of Levsin with request to follow-up by phone or portal message soon afterward.  If things or not improved, plans were for testing or perhaps empiric treatment for SIBO and consideration of EGD and colonoscopy.  No further contact from patient after that. ________________________________________  ***   ROS:  Review of Systems   Past Medical History: Past Medical History:  Diagnosis Date   Celiac disease    Chronic back pain    Chronic shoulder pain    Diabetes 1.5, managed as type 2 ([facility])    Diabetic neuropathy ([facility])    Hyperlipidemia    Hypertension    Internal hemorrhoids      Past Surgical History: Past Surgical History:  Procedure Laterality Date   COLONOSCOPY  2007 2016   Dr Kinnie Scales    ESOPHAGOGASTRODUODENOSCOPY  2011   Dr [facility]     NECK SURGERY     SHOULDER SURGERY  2009   TEE WITHOUT CARDIOVERSION N/A 11/23/2021   Procedure: TRANSESOPHAGEAL ECHOCARDIOGRAM (TEE);  Surgeon: Little Ishikawa, MD;  Location: [facility] ENDOSCOPY;  Service: Cardiovascular;  Laterality: N/A;     Family History: Family History  Problem Relation Age of Onset   CAD Mother    CAD Father     Social History: Social History   Socioeconomic History   Marital status: Married    Spouse name: Not on file   Number of children: Not on file   Years of education: Not on file   Highest education level: Not on file  Occupational History   Not on file  Tobacco Use   Smoking status: Former   Smokeless tobacco: Never  Substance and Sexual Activity   Alcohol use: No   Drug use: No   Sexual activity: Yes  Other Topics Concern   Not on file  Social History Narrative   Not on file   Social Determinants of Health   Financial Resource Strain: Not on file  Food Insecurity: Not on file  Transportation Needs: Not on file  Physical Activity: Not on file  Stress: Not on file  Social Connections: Not on file    Allergies: No Known Allergies  Outpatient Meds: Current Outpatient Medications  Medication Sig Dispense Refill   ALPRAZolam (XANAX) 1 MG tablet Take 0.5 tablets (0.5 mg total) by mouth 5 (five) times daily. 75 tablet 5   amLODipine (NORVASC) 10 MG tablet Take 5 mg by mouth daily.     Continuous Blood Gluc Sensor (FREESTYLE  LIBRE 14 DAY SENSOR) MISC by Does not apply route.     doxycycline (VIBRA-TABS) 100 MG tablet Take 1 tablet (100 mg total) by mouth 2 (two) times daily. 15 tablet 1   fluticasone (FLONASE) 50 MCG/ACT nasal spray Place 2 sprays into both nostrils at bedtime as needed for allergies or rhinitis.     gabapentin (NEURONTIN) 100 MG capsule Take 1 capsule (100 mg total) by mouth 3 (three) times daily. 90 capsule 1   glucose blood test strip 1 each by Other route as needed for other. Use as instructed      hydrochlorothiazide (HYDRODIURIL) 25 MG tablet Take 1 tablet (25 mg total) by mouth daily. Patient needs appointment for any future refills.  Please call office at [phone] to schedule appointment. 1st attempt. 90 tablet 3   hydrocortisone 2.5 % cream Apply 1 application. topically 2 (two) times daily as needed (skin irritation).     insulin aspart (NOVOLOG) 100 UNIT/ML injection Inject 0-70 Units into the skin See admin instructions. Inject up to 70 units daily via pump on sliding scale     omeprazole (PRILOSEC) 40 MG capsule Take 40 mg by mouth daily.     PERCOCET 10-325 MG tablet Take 1 tablet by mouth every 6 (six) hours as needed for pain.  0   pregabalin (LYRICA) 50 MG capsule Take 1 capsule (50 mg total) by mouth 3 (three) times daily. 60 capsule 3   sertraline (ZOLOFT) 25 MG tablet TAKE 1 TABLET(25 MG) BY MOUTH DAILY 90 tablet 3   sildenafil (VIAGRA) 100 MG tablet Take 1 tablet (100 mg total) by mouth daily as needed for erectile dysfunction. 30 tablet 3   terbinafine (LAMISIL) 250 MG tablet Please take one a day x 7days, repeat every 4 weeks x 4 months 28 tablet 0   Testosterone 20.25 MG/ACT (1.62%) GEL Apply 2 Pump topically every other day.     valACYclovir (VALTREX) 500 MG tablet Take 500 mg by mouth daily.     valsartan (DIOVAN) 320 MG tablet Take 1 tablet (320 mg total) by mouth daily. 90 tablet 3   No current facility-administered medications for this visit.      ___________________________________________________________________ Objective   Exam:  There were no vitals taken for this visit. Wt Readings from Last 3 Encounters:  08/02/22 145 lb 12.8 oz (66.1 kg)  02/09/22 142 lb 9.6 oz (64.7 kg)  12/09/21 137 lb (62.1 kg)    General: ***  Eyes: sclera anicteric, no redness ENT: oral mucosa moist without lesions, no cervical or supraclavicular lymphadenopathy CV: ***, no JVD, no peripheral edema Resp: clear to auscultation bilaterally, normal RR and effort noted GI:  soft, *** tenderness, with active bowel sounds. No guarding or palpable organomegaly noted. Skin; warm and dry, no rash or jaundice noted Neuro: awake, alert and oriented x 3. Normal gross motor function and fluent speech  Labs:  ***  Radiologic Studies:  ***  Assessment: No diagnosis found.  ***  Plan:  ***  Thank you for the courtesy of this consult.  Please call me with any questions or concerns.  Charlie Pitter III  CC: Referring provider noted above

## 2023-02-22 NOTE — Progress Notes (Signed)
[facility] Consult Note:  History: Kyle Velazquez 02/22/2023  Referring provider: Adrian Prince, MD  Reason for consult/chief complaint: Discuss Colonoscopy (Has celiac disease and his stomach is uncomfortable)   Subjective  HPI: Summary of pertinent GI issues:  Kyle Velazquez saw me in clinic February 2022 transferring care after retirement of Kyle. Kinnie Velazquez.  He has biopsy-proven celiac sprue and does his best to maintain a gluten-free diet.  When he transferred care to me, he was describing chronic bloated abdominal discomfort and loose stool, and had undergone colonoscopy Kyle. Kinnie Velazquez shortly before that.  Patient did not have the opportunity to share the clinical history with Kyle. Kinnie Velazquez, no biopsies were taken during that procedure. October 2021 colonoscopy with Kyle. Kinnie Velazquez, subcentimeter polyp removed, no pathology report available. Upper endoscopy 10/11/2017, biopsies of the duodenum showing marked villous atrophy and increased intraepithelial lymphocytes consistent with celiac marsh type IIIb (type IIIa in the bulb)  Initial consult with Kyle Velazquez, considerations were maldigestion, IBS, SIBO, microscopic colitis.  Patient given some written dietary advice and a trial of Levsin with request to follow-up by phone or portal message soon afterward.  If things or not improved, plans were for testing or perhaps empiric treatment for SIBO and consideration of EGD and colonoscopy.  No further contact from patient after that. ________________________________________  Today, he complains of alternating constipation and diarrhea that started about 6 months ago. He states that he has a BM daily even without laxative use but reports his stool being hard and not feeling completely evacuated. He reports having celiac and being on a gluten free diet but would occasionally eat cheese and corn. He reports using Miralax when constipated and states that prior to his celiac diagnosis, he used  Miralax daily for 2 years then stopped it as he no longer needed it. He also complains of constant abdominal discomfort and can't contribute the discomfort to celiac as he always feels it. Patient is concerned about the possibility of colorectal cancer as a cause for the last 6 months or so of constipation.  He reports that his diabetes is well controled and states that his last A1c was 7.8.    Kyle Velazquez denies nausea, blood in stool, black stool, vomiting, abdominal pain, bloating, unintentional weight loss, reflux, dysphagia.  ROS:  Review of Systems  Constitutional:  Negative for appetite change and fever.  HENT:  Negative for trouble swallowing.   Respiratory:  Negative for cough and shortness of breath.   Cardiovascular:  Negative for chest pain.  Gastrointestinal:  Positive for abdominal pain, constipation and diarrhea. Negative for abdominal distention, anal bleeding, blood in stool, nausea, rectal pain and vomiting.  Genitourinary:  Negative for dysuria.  Musculoskeletal:  Negative for back pain.  Skin:  Negative for rash.  Neurological:  Negative for weakness.  All other systems reviewed and are negative.    Past Medical History: Past Medical History:  Diagnosis Date   Celiac disease    Chronic back pain    Chronic shoulder pain    Diabetes 1.5, managed as type 2 (HCC)    Diabetic neuropathy (HCC)    Hyperlipidemia    Hypertension    Internal hemorrhoids      Past Surgical History: Past Surgical History:  Procedure Laterality Date   COLONOSCOPY  2007 2016   Kyle Velazquez    ESOPHAGOGASTRODUODENOSCOPY  2011   [facility]    NECK SURGERY     SHOULDER SURGERY  2009   TEE WITHOUT CARDIOVERSION N/A 11/23/2021  Procedure: TRANSESOPHAGEAL ECHOCARDIOGRAM (TEE);  Surgeon: Kyle Ishikawa, MD;  Location: [facility] ENDOSCOPY;  Service: Cardiovascular;  Laterality: N/A;     Family History: Family History  Problem Relation Age of Onset   CAD Mother    CAD Father     Social  History: Social History   Socioeconomic History   Marital status: Married    Spouse name: Not on file   Number of children: Not on file   Years of education: Not on file   Highest education level: Not on file  Occupational History   Not on file  Tobacco Use   Smoking status: Former   Smokeless tobacco: Never  Vaping Use   Vaping Use: Never used  Substance and Sexual Activity   Alcohol use: No   Drug use: No   Sexual activity: Yes  Other Topics Concern   Not on file  Social History Narrative   Not on file   Social Determinants of Health   Financial Resource Strain: Not on file  Food Insecurity: Not on file  Transportation Needs: Not on file  Physical Activity: Not on file  Stress: Not on file  Social Connections: Not on file   His wife Kyle Velazquez is also my patient  They have lately been enjoying time with their grandchildren.  Allergies: No Known Allergies  Outpatient Meds: Current Outpatient Medications  Medication Sig Dispense Refill   ALPRAZolam (XANAX) 1 MG tablet Take 0.5 tablets (0.5 mg total) by mouth 5 (five) times daily. 75 tablet 5   amLODipine (NORVASC) 10 MG tablet Take 5 mg by mouth daily.     Continuous Blood Gluc Sensor (FREESTYLE LIBRE 14 DAY SENSOR) MISC by Does not apply route.     fluticasone (FLONASE) 50 MCG/ACT nasal spray Place 2 sprays into both nostrils at bedtime as needed for allergies or rhinitis.     glucose blood test strip 1 each by Other route as needed for other. Use as instructed     hydrochlorothiazide (HYDRODIURIL) 25 MG tablet Take 1 tablet (25 mg total) by mouth daily. Patient needs appointment for any future refills.  Please call office at [phone] to schedule appointment. 1st attempt. 90 tablet 3   hydrocortisone 2.5 % cream Apply 1 application. topically 2 (two) times daily as needed (skin irritation).     insulin aspart (NOVOLOG) 100 UNIT/ML injection Inject 0-70 Units into the skin See admin instructions. Inject up to 70  units daily via pump on sliding scale     omeprazole (PRILOSEC) 40 MG capsule Take 40 mg by mouth daily.     PERCOCET 10-325 MG tablet Take 1 tablet by mouth every 6 (six) hours as needed for pain.  0   pregabalin (LYRICA) 50 MG capsule Take 1 capsule (50 mg total) by mouth 3 (three) times daily. 60 capsule 3   sertraline (ZOLOFT) 25 MG tablet TAKE 1 TABLET(25 MG) BY MOUTH DAILY 90 tablet 3   sildenafil (VIAGRA) 100 MG tablet Take 1 tablet (100 mg total) by mouth daily as needed for erectile dysfunction. 30 tablet 3   terbinafine (LAMISIL) 250 MG tablet Please take one a day x 7days, repeat every 4 weeks x 4 months 28 tablet 0   Testosterone 20.25 MG/ACT (1.62%) GEL Apply 2 Pump topically every other day.     valACYclovir (VALTREX) 500 MG tablet Take 500 mg by mouth daily.     valsartan (DIOVAN) 320 MG tablet Take 1 tablet (320 mg total) by mouth daily. 90 tablet 3  No current facility-administered medications for this visit.      ___________________________________________________________________ Objective   Exam:  BP (!) 134/58   Pulse (!) 53   Ht 5\' 7"  (1.702 m)   Wt 142 lb (64.4 kg)   BMI 22.24 kg/m  Wt Readings from Last 3 Encounters:  02/22/23 142 lb (64.4 kg)  08/02/22 145 lb 12.8 oz (66.1 kg)  02/09/22 142 lb 9.6 oz (64.7 kg)    General: well appearing   Eyes: sclera anicteric, no redness ENT: oral mucosa moist without lesions, no cervical or supraclavicular lymphadenopathy CV: RRR, no JVD, peripheral edema Resp: clear to auscultation bilaterally, normal RR and effort noted GI: soft, no tenderness, with active bowel sounds. No guarding or palpable organomegaly noted. Skin; warm and dry, no rash or jaundice noted Neuro: awake, alert and oriented x 3. Normal gross motor function and fluent speech  Labs: No recent labs, says he has them regularly with primary care and endocrinology Radiologic Studies:  Assessment: Altered bowel habits  Celiac  disease  Generalized abdominal pain  Chronic generalized abdominal pain with underlying celiac.  He is very strict with a gluten-free diet but recognizes that gluten may be found in medicines and other sources.  Chronic diarrhea, but altered bowel habits in the last 6 or so months with development of intermittent constipation.  While he does not have rectal bleeding, reported iron deficiency anemia or weight loss, this requires further investigation.  Could have underlying microscopic colitis as well.  He has not been tested or treated for SIBO that I know of. Plan:  EGD and Colonoscopy - -he was agreeable after discussion of procedure and risks.  The benefits and risks of the planned procedure were described in detail with the patient or (when appropriate) their health care proxy.  Risks were outlined as including, but not limited to, bleeding, infection, perforation, adverse medication reaction leading to cardiac or pulmonary decompensation, pancreatitis (if ERCP).  The limitation of incomplete mucosal visualization was also discussed.  No guarantees or warranties were given.  We will take biopsies of the small bowel and colon.  Based on those findings, can then consider testing or possible empiric treatment for SIBO.  Thank you for the courtesy of this consult.  Please call me with any questions or concerns.   I,Safa M Kadhim,acting as a scribe for Charlie Pitter III, MD.,have documented all relevant documentation on the behalf of Sherrilyn Rist, MD,as directed by  Sherrilyn Rist, MD while in the presence of Sherrilyn Rist, MD.   Marvis Repress III, MD, have reviewed all documentation for this visit. The documentation on 02/22/23 for the exam, diagnosis, procedures, and orders are all accurate and complete.    CC: Referring provider noted above

## 2023-02-22 NOTE — Patient Instructions (Signed)
_______________________________________________________  If your blood pressure at your visit was 140/90 or greater, please contact your primary care physician to follow up on this.  _______________________________________________________  If you are age 68 or older, your body mass index should be between 23-30. Your Body mass index is 22.24 kg/m. If this is out of the aforementioned range listed, please consider follow up with your Primary Care Provider.  If you are age 35 or younger, your body mass index should be between 19-25. Your Body mass index is 22.24 kg/m. If this is out of the aformentioned range listed, please consider follow up with your Primary Care Provider.   ________________________________________________________  The [facility] providers would like to encourage you to use [facility] to communicate with providers for non-urgent requests or questions.  Due to long hold times on the telephone, sending your provider a message by [facility] may be a faster and more efficient way to get a response.  Please allow 48 business hours for a response.  Please remember that this is for non-urgent requests.  _______________________________________________________  Bonita Quin have been scheduled for a colonoscopy. Please follow written instructions given to you at your visit today.  Please pick up your prep supplies at the pharmacy within the next 1-3 days. If you use inhalers (even only as needed), please bring them with you on the day of your procedure.  Due to recent changes in healthcare laws, you may see the results of your imaging and laboratory studies on MyChart before your provider has had a chance to review them.  We understand that in some cases there may be results that are confusing or concerning to you. Not all laboratory results come back in the same time frame and the provider may be waiting for multiple results in order to interpret others.  Please give [place] 48 hours in order for your  provider to thoroughly review all the results before contacting the office for clarification of your results.   It was a pleasure to see you today!  Thank you for trusting me with your gastrointestinal care!

## 2023-03-11 ENCOUNTER — Telehealth: Payer: Self-pay | Admitting: Gastroenterology

## 2023-03-11 NOTE — Telephone Encounter (Signed)
Inbound call from patient inquiring why he is having a procedure in July. Please advise.

## 2023-03-11 NOTE — Telephone Encounter (Signed)
Returned call to patient. I reviewed last office visit note with him. I explained to patient that he is scheduled for an EGD and colonoscopy so that Dr. Myrtie Neither can take small bowel and colon biopsies due to chronic abdominal pain in the setting of celiac disease and altered bowel habits. Pt is aware that there will be additional recommendations to follow his procedure. Pt verbalized understanding and had no concerns at the end of the call.

## 2023-03-14 ENCOUNTER — Ambulatory Visit (INDEPENDENT_AMBULATORY_CARE_PROVIDER_SITE_OTHER): Payer: Medicare Other | Admitting: Podiatry

## 2023-03-14 DIAGNOSIS — L603 Nail dystrophy: Secondary | ICD-10-CM | POA: Diagnosis not present

## 2023-03-14 DIAGNOSIS — G5790 Unspecified mononeuropathy of unspecified lower limb: Secondary | ICD-10-CM | POA: Diagnosis not present

## 2023-03-14 MED ORDER — PREGABALIN 75 MG PO CAPS: 75.0000 mg | ORAL_CAPSULE | Freq: Two times a day (BID) | ORAL | 3 refills | Status: DC

## 2023-03-14 NOTE — Progress Notes (Signed)
Kyle Velazquez presents today for a follow-up of his painful great toes bilaterally.  He states that the Lyrica has made some difference particularly in his legs and hands but not as much in his big toes.  He states that the toes still hurt as he points to the proximal nail fold.  He states that is affecting his ability to perform his daily activities including taking the trash down and he is having to discontinue his activities that he enjoyed for his health maintenance.  Objective: Vital signs are stable he is alert and oriented x 3.  The nail plate hallux right does demonstrate significant onycholysis distally.  The hallux nail plate left does demonstrate some nail hypertrophy but there appears to have been a chemical matrixectomy that has resulted in a partial nail loss.  Assessment: Neuropathy nail disease chronic pain hallux bilateral.  Plan: I debrided the nails today to help lessen the pressure on the toes.  I also increased his Lyrica 75 mg twice daily.  He will notify me if there are any problems taking the medication otherwise I like to follow-up with him in about 6 weeks

## 2023-03-15 ENCOUNTER — Ambulatory Visit: Payer: Medicare Other | Admitting: Podiatry

## 2023-03-16 ENCOUNTER — Other Ambulatory Visit: Payer: Self-pay | Admitting: Gastroenterology

## 2023-03-16 ENCOUNTER — Other Ambulatory Visit: Payer: Self-pay | Admitting: Podiatry

## 2023-03-21 NOTE — Telephone Encounter (Signed)
I sent in refill

## 2023-04-05 ENCOUNTER — Telehealth: Payer: Self-pay | Admitting: Gastroenterology

## 2023-04-05 NOTE — Telephone Encounter (Signed)
Returned call to patient and answered questions about type of PO liquid intake day before procedure. Patient wanted to know if he can drink Body Armour to maintain his blood sugar. Advise patient to avoid any red or purple color liquid. Patient verbalized understanding.

## 2023-04-05 NOTE — Telephone Encounter (Signed)
Patient called requesting to speak with a nurse regarding his pre instructions for procedure scheduled for tomorrow.

## 2023-04-06 ENCOUNTER — Encounter: Payer: Self-pay | Admitting: Gastroenterology

## 2023-04-06 ENCOUNTER — Ambulatory Visit: Payer: Medicare Other | Admitting: Gastroenterology

## 2023-04-06 VITALS — BP 101/70 | HR 61 | Temp 98.0°F | Resp 11 | Ht 67.0 in | Wt 142.0 lb

## 2023-04-06 DIAGNOSIS — K900 Celiac disease: Secondary | ICD-10-CM

## 2023-04-06 DIAGNOSIS — Q433 Congenital malformations of intestinal fixation: Secondary | ICD-10-CM

## 2023-04-06 DIAGNOSIS — R194 Change in bowel habit: Secondary | ICD-10-CM

## 2023-04-06 MED ORDER — SODIUM CHLORIDE 0.9 % IV SOLN: 500.0000 mL | Freq: Once | INTRAVENOUS | Status: DC

## 2023-04-06 NOTE — Progress Notes (Signed)
Vitals-Dt  Pt's states no medical or surgical changes since previsit or office visit.  Insulin pump is off per pt.

## 2023-04-06 NOTE — Op Note (Signed)
[facility] Patient Name: Kyle Velazquez Procedure Date: 04/06/2023 10:45 AM MRN: [id number] Endoscopist: Sherilyn Cooter [name] , MD, [id number] Age: 67 Referring MD:  Date of Birth: 1956-07-04 Gender: Male Account #: [id number] Procedure:                Upper GI endoscopy Indications:              Follow-up of celiac disease                           Clinical details and recent office consult note Medicines:                Monitored Anesthesia Care Procedure:                Pre-Anesthesia Assessment:                           - Prior to the procedure, a History and Physical                            was performed, and patient medications and                            allergies were reviewed. The patient's tolerance of                            previous anesthesia was also reviewed. The risks                            and benefits of the procedure and the sedation                            options and risks were discussed with the patient.                            All questions were answered, and informed consent                            was obtained. Prior Anticoagulants: The patient has                            taken no anticoagulant or antiplatelet agents. ASA                            Grade Assessment: II - A patient with mild systemic                            disease. After reviewing the risks and benefits,                            the patient was deemed in satisfactory condition to                            undergo the procedure.  After obtaining informed consent, the endoscope was                            passed under direct vision. Throughout the                            procedure, the patient's blood pressure, pulse, and                            oxygen saturations were monitored continuously. The                            GIF [id number] #[id number] was introduced through the                            mouth, and advanced to the  second part of duodenum.                            The upper GI endoscopy was accomplished without                            difficulty. The patient tolerated the procedure                            well. Scope In: Scope Out: Findings:                 The larynx was normal.                           The esophagus was normal.                           The stomach was normal.                           The cardia and gastric fundus were normal on                            retroflexion.                           Normal mucosa was found in the entire duodenum                            (examined under WL and NBI). No raised or                            suspicious lesions. No mucosal irregularities. No                            apparent mucosal atrophy. Several biopsies were                            obtained in the duodenal bulb (jar 2) and in the  second portion of the duodenum (jar 1) with cold                            forceps for histology. Complications:            No immediate complications. Estimated Blood Loss:     Estimated blood loss was minimal. Impression:               - Normal larynx.                           - Normal esophagus.                           - Normal stomach.                           - Normal mucosa was found in the entire examined                            duodenum.                           - Several biopsies were obtained in the duodenal                            bulb and in the second portion of the duodenum. Recommendation:           - Patient has a contact number available for                            emergencies. The signs and symptoms of potential                            delayed complications were discussed with the                            patient. Return to normal activities tomorrow.                            Written discharge instructions were provided to the                            patient.                            - Resume previous diet (gluten-free).                           - Continue present medications.                           - Await pathology results.                           - See the other procedure note for documentation of  additional recommendations. Janaya Broy [name], MD 04/06/2023 11:24:43 AM This report has been signed electronically.

## 2023-04-06 NOTE — Progress Notes (Signed)
Called to room to assist during endoscopic procedure.  Patient ID and intended procedure confirmed with present staff. Received instructions for my participation in the procedure from the performing physician.  

## 2023-04-06 NOTE — Op Note (Signed)
[facility] Patient Name: Kyle Velazquez Procedure Date: 04/06/2023 10:42 AM MRN: [id number] Endoscopist: Sherilyn Cooter [name] , MD, [id number] Age: 66 Referring MD:  Date of Birth: 07-20-1956 Gender: Male Account #: [id number] Procedure:                Colonoscopy Indications:              Change in bowel habits (intermittent constipation                            with lower abdominal discomfort. Clinical details                            and recent office consult note) Medicines:                Monitored Anesthesia Care Procedure:                Pre-Anesthesia Assessment:                           - Prior to the procedure, a History and Physical                            was performed, and patient medications and                            allergies were reviewed. The patient's tolerance of                            previous anesthesia was also reviewed. The risks                            and benefits of the procedure and the sedation                            options and risks were discussed with the patient.                            All questions were answered, and informed consent                            was obtained. Prior Anticoagulants: The patient has                            taken no anticoagulant or antiplatelet agents. ASA                            Grade Assessment: II - A patient with mild systemic                            disease. After reviewing the risks and benefits,                            the patient was deemed in satisfactory condition to  undergo the procedure.                           After obtaining informed consent, the colonoscope                            was passed under direct vision. Throughout the                            procedure, the patient's blood pressure, pulse, and                            oxygen saturations were monitored continuously. The                            Olympus Scope SN: [id number]  was introduced through                            the anus and advanced to the the terminal ileum,                            with identification of the appendiceal orifice and                            IC valve. The colonoscopy was somewhat difficult                            due to a redundant colon. Successful completion of                            the procedure was aided by straightening and                            shortening the scope to obtain bowel loop                            reduction. The patient tolerated the procedure                            well. The quality of the bowel preparation was                            excellent. The terminal ileum, ileocecal valve,                            appendiceal orifice, and rectum were photographed. Scope In: 11:05:10 AM Scope Out: 11:19:38 AM Scope Withdrawal Time: 0 hours 10 minutes 10 seconds  Total Procedure Duration: 0 hours 14 minutes 28 seconds  Findings:                 The perianal and digital rectal examinations were                            normal.  The terminal ileum appeared normal.                           Repeat examination of right colon under NBI                            performed.                           Internal hemorrhoids were found.                           The exam was otherwise without abnormality on                            direct and retroflexion views. Complications:            No immediate complications. Estimated Blood Loss:     Estimated blood loss was minimal. Impression:               - The examined portion of the ileum was normal.                           - Internal hemorrhoids.                           - The examination was otherwise normal on direct                            and retroflexion views.                           - No specimens collected.                           Benign change in bowel habits, perhaps related to                             somewhat redundant colon anatomy. Recommendation:           - Patient has a contact number available for                            emergencies. The signs and symptoms of potential                            delayed complications were discussed with the                            patient. Return to normal activities tomorrow.                            Written discharge instructions were provided to the                            patient.                           -  Resume previous diet.                           - Continue present medications. Continue as needed                            use of MiraLAX.                           - Repeat colonoscopy in 10 years for screening                            purposes.                           - Return to GI clinic PRN. Adra Shepler [name], MD 04/06/2023 11:28:11 AM This report has been signed electronically.

## 2023-04-06 NOTE — Progress Notes (Signed)
History and Physical:  This patient presents for endoscopic testing for: Encounter Diagnoses  Name Primary?   Altered bowel habits Yes   Celiac disease     Clinical details in 02/22/23 office consult note, and he reports no changes since then. EGD today for abdominal pain and celiac disease. Colonoscopy for abdominal pain and altered bowel habits (mainly intermittent constipation)  Patient is otherwise without complaints or active issues today.   Past Medical History: Past Medical History:  Diagnosis Date   Celiac disease    Chronic back pain    Chronic shoulder pain    Diabetes 1.5, managed as type 2 (HCC)    Diabetic neuropathy (HCC)    Hyperlipidemia    Hypertension    Internal hemorrhoids      Past Surgical History: Past Surgical History:  Procedure Laterality Date   COLONOSCOPY  2007 2016   Dr Kinnie Scales    ESOPHAGOGASTRODUODENOSCOPY  2011   Dr [facility]    NECK SURGERY     SHOULDER SURGERY  2009   TEE WITHOUT CARDIOVERSION N/A 11/23/2021   Procedure: TRANSESOPHAGEAL ECHOCARDIOGRAM (TEE);  Surgeon: Little Ishikawa, MD;  Location: [facility] ENDOSCOPY;  Service: Cardiovascular;  Laterality: N/A;    Allergies: No Known Allergies  Outpatient Meds: Current Outpatient Medications  Medication Sig Dispense Refill   ALPRAZolam (XANAX) 1 MG tablet Take 0.5 tablets (0.5 mg total) by mouth 5 (five) times daily. 75 tablet 5   amLODipine (NORVASC) 10 MG tablet Take 5 mg by mouth daily.     Continuous Blood Gluc Sensor (FREESTYLE LIBRE 14 DAY SENSOR) MISC by Does not apply route.     fluticasone (FLONASE) 50 MCG/ACT nasal spray Place 2 sprays into both nostrils at bedtime as needed for allergies or rhinitis.     glucose blood test strip 1 each by Other route as needed for other. Use as instructed     hydrochlorothiazide (HYDRODIURIL) 25 MG tablet Take 1 tablet (25 mg total) by mouth daily. Patient needs appointment for any future refills.  Please call office at [phone] to  schedule appointment. 1st attempt. 90 tablet 3   hydrocortisone 2.5 % cream Apply 1 application. topically 2 (two) times daily as needed (skin irritation).     insulin aspart (NOVOLOG) 100 UNIT/ML injection Inject 0-70 Units into the skin See admin instructions. Inject up to 70 units daily via pump on sliding scale     omeprazole (PRILOSEC) 40 MG capsule Take 40 mg by mouth daily.     PERCOCET 10-325 MG tablet Take 1 tablet by mouth every 6 (six) hours as needed for pain.  0   pregabalin (LYRICA) 50 MG capsule Take 1 capsule (50 mg total) by mouth 3 (three) times daily. 60 capsule 3   sertraline (ZOLOFT) 25 MG tablet TAKE 1 TABLET(25 MG) BY MOUTH DAILY 90 tablet 3   terbinafine (LAMISIL) 250 MG tablet PLEASE TAKE 1 TABLET A DAY X 7 DAYS, REPEAT EVERY 4 WEEKS X 4 MONTHS 28 tablet 0   Testosterone 20.25 MG/ACT (1.62%) GEL Apply 2 Pump topically every other day.     valACYclovir (VALTREX) 500 MG tablet Take 500 mg by mouth daily.     valsartan (DIOVAN) 320 MG tablet Take 1 tablet (320 mg total) by mouth daily. 90 tablet 3   pregabalin (LYRICA) 75 MG capsule Take 1 capsule (75 mg total) by mouth 2 (two) times daily. 180 capsule 3   sildenafil (VIAGRA) 100 MG tablet Take 1 tablet (100 mg total) by mouth  daily as needed for erectile dysfunction. 30 tablet 3   Current Facility-Administered Medications  Medication Dose Route Frequency Provider Last Rate Last Admin   0.9 %  sodium chloride infusion  500 mL Intravenous Once Danis, Andreas Blower, MD          ___________________________________________________________________ Objective   Exam:  BP (!) 147/59 (BP Location: Right Arm, Patient Position: Sitting, Cuff Size: Normal)   Pulse (!) 53   Temp 98 F (36.7 C) (Temporal)   Ht 5\' 7"  (1.702 m)   Wt 142 lb (64.4 kg)   SpO2 100%   BMI 22.24 kg/m   CV: regular , S1/S2 Resp: clear to auscultation bilaterally, normal RR and effort noted GI: soft, no tenderness, with active bowel  sounds.   Assessment: Encounter Diagnoses  Name Primary?   Altered bowel habits Yes   Celiac disease      Plan: Colonoscopy EGD  The benefits and risks of the planned procedure were described in detail with the patient or (when appropriate) their health care proxy.  Risks were outlined as including, but not limited to, bleeding, infection, perforation, adverse medication reaction leading to cardiac or pulmonary decompensation, pancreatitis (if ERCP).  The limitation of incomplete mucosal visualization was also discussed.  No guarantees or warranties were given.  The patient is appropriate for an endoscopic procedure in the ambulatory setting.   - Amada Jupiter, MD

## 2023-04-06 NOTE — Progress Notes (Signed)
Uneventful anesthetic. Report to pacu rn. Vss. Care resumed by rn. 

## 2023-04-06 NOTE — Patient Instructions (Addendum)
Resume previous diet and medications. Awaiting pathology results. Repeat Colonoscopy in 10 years for screening purposes. Return to GI Clinic as needed.  YOU HAD AN ENDOSCOPIC PROCEDURE TODAY AT THE [facility]:   Refer to the procedure report that was given to you for any specific questions about what was found during the examination.  If the procedure report does not answer your questions, please call your gastroenterologist to clarify.  If you requested that your care partner not be given the details of your procedure findings, then the procedure report has been included in a sealed envelope for you to review at your convenience later.  YOU SHOULD EXPECT: Some feelings of bloating in the abdomen. Passage of more gas than usual.  Walking can help get rid of the air that was put into your GI tract during the procedure and reduce the bloating. If you had a lower endoscopy (such as a colonoscopy or flexible sigmoidoscopy) you may notice spotting of blood in your stool or on the toilet paper. If you underwent a bowel prep for your procedure, you may not have a normal bowel movement for a few days.  Please Note:  You might notice some irritation and congestion in your nose or some drainage.  This is from the oxygen used during your procedure.  There is no need for concern and it should clear up in a day or so.  SYMPTOMS TO REPORT IMMEDIATELY:  Following lower endoscopy (colonoscopy or flexible sigmoidoscopy):  Excessive amounts of blood in the stool  Significant tenderness or worsening of abdominal pains  Swelling of the abdomen that is new, acute  Fever of 100F or higher  Following upper endoscopy (EGD)  Vomiting of blood or coffee ground material  New chest pain or pain under the shoulder blades  Painful or persistently difficult swallowing  New shortness of breath  Fever of 100F or higher  Black, tarry-looking stools  For urgent or emergent issues, a gastroenterologist can be  reached at any hour by calling [phone]. Do not use MyChart messaging for urgent concerns.    DIET:  We do recommend a small meal at first, but then you may proceed to your regular diet.  Drink plenty of fluids but you should avoid alcoholic beverages for 24 hours.  ACTIVITY:  You should plan to take it easy for the rest of today and you should NOT DRIVE or use heavy machinery until tomorrow (because of the sedation medicines used during the test).    FOLLOW UP: Our staff will call the number listed on your records the next business day following your procedure.  We will call around 7:15- 8:00 am to check on you and address any questions or concerns that you may have regarding the information given to you following your procedure. If we do not reach you, we will leave a message.     If any biopsies were taken you will be contacted by phone or by letter within the next 1-3 weeks.  Please call us at [phone] if you have not heard about the biopsies in 3 weeks.    SIGNATURES/CONFIDENTIALITY: You and/or your care partner have signed paperwork which will be entered into your electronic medical record.  These signatures attest to the fact that that the information above on your After Visit Summary has been reviewed and is understood.  Full responsibility of the confidentiality of this discharge information lies with you and/or your care-partner.  ______________   _______________________________________________________  Food Guidelines  for those with chronic digestive trouble:  Many people have difficulty digesting certain foods, causing a variety of distressing and embarrassing symptoms such as abdominal pain, bloating and gas.  These foods may need to be avoided or consumed in small amounts.  Here are some tips that might be helpful for you.  1.   Lactose intolerance is the difficulty or complete inability to digest lactose, the natural sugar in milk and anything made from milk.   This condition is harmless, common, and can begin any time during life.  Some people can digest a modest amount of lactose while others cannot tolerate any.  Also, not all dairy products contain equal amounts of lactose.  For example, hard cheeses such as parmesan have less lactose than soft cheeses such as cheddar.  Yogurt has less lactose than milk or cheese.  Many packaged foods (even many brands of bread) have milk, so read ingredient lists carefully.  It is difficult to test for lactose intolerance, so just try avoiding lactose as much as possible for a week and see what happens with your symptoms.  If you seem to be lactose intolerant, the best plan is to avoid it (but make sure you get calcium from another source).  The next best thing is to use lactase enzyme supplements, available over the counter everywhere.  Just know that many lactose intolerant people need to take several tablets with each serving of dairy to avoid symptoms.  Lastly, a lot of restaurant food is made with milk or butter.  Many are things you might not suspect, such as mashed potatoes, rice and pasta (cooked with butter) and "grilled" items.  If you are lactose intolerant, it never hurts to ask your server what has milk or butter.  2.   Fiber is an important part of your diet, but not all fiber is well-tolerated.  Insoluble fiber such as bran is often consumed by normal gut bacteria and converted into gas.  Soluble fiber such as oats, squash, carrots and green beans are typically tolerated better.  3.   Some types of carbohydrates can be poorly digested.  Examples include: fructose (apples, cherries, pears, raisins and other dried fruits), fructans (onions, zucchini, large amounts of wheat), sorbitol/mannitol/xylitol and sucralose/Splenda (common artificial sweeteners), and raffinose (lentils, broccoli, cabbage, asparagus, brussel sprouts, many types of beans).  Do a Programmer, multimedia for [employer] [place] and you will find helpful  information. Beano, a dietary supplement, will often help with raffinose-containing foods.  As with lactase tablets, you may need several per serving.  4.   Whenever possible, avoid processed food&meats and chemical additives.  High fructose corn syrup, a common sweetener, may be difficult to digest.  Eggs and soy (comes from the soybean, and added to many foods now) are other common bloating/gassy foods.  5.  Regarding gluten:  gluten is a protein mainly found in wheat, but also rye and barley.  There is a condition called celiac sprue, which is an inflammatory reaction in the small intestine causing a variety of digestive symptoms.  Blood testing is highly reliable to look for this condition, and sometimes upper endoscopy with small bowel biopsies may be necessary to make the diagnosis.  Many patients who test negative for celiac sprue report improvement in their digestive symptoms when they switch to a gluten-free diet.  However, in these "non-celiac gluten sensitive" patients, the true role of gluten in their symptoms is unclear.  Reducing carbohydrates in general may decrease the gas and bloating caused  when gut bacteria consume carbs. Also, some of these patients may actually be intolerant of the baker's yeast in bread products rather than the gluten.  Flatbread and other reduced yeast breads might therefore be tolerated.  There is no specific testing available for most food intolerances, which are discovered mainly by dietary elimination.  Please do not embark on a gluten free diet unless directed by your doctor, as it is highly restrictive, and may lead to nutritional deficiencies if not carefully monitored.  Lastly, beware of internet claims offering "personalized" tests for food intolerances.  Such testing has no reliable scientific evidence to support its reliability and correlation to symptoms.    6.  The best advice is old advice, especially for those with chronic digestive trouble - try to eat  "clean".  Balanced diet, avoid processed food, plenty of fruits and vegetables, cut down the sugar, minimal alcohol, avoid tobacco. Make time to care for yourself, get enough sleep, exercise when you can, reduce stress.  Your guts will thank you for it.   - Dr. Sherlynn [facility]  ____________________________________________________________

## 2023-04-07 ENCOUNTER — Telehealth: Payer: Self-pay

## 2023-04-07 NOTE — Telephone Encounter (Signed)
  Follow up Call-     04/06/2023   10:04 AM 04/06/2023    9:56 AM  Call back number  Post procedure Call Back phone  # [phone]   Permission to leave phone message  Yes     Patient questions:  Do you have a fever, pain , or abdominal swelling? No. Pain Score  0 *  Have you tolerated food without any problems? Yes.    Have you been able to return to your normal activities? Yes.    Do you have any questions about your discharge instructions: Diet   No. Medications  No. Follow up visit  No.  Do you have questions or concerns about your Care? No.  Actions: * If pain score is 4 or above: No action needed, pain <4.

## 2023-04-19 ENCOUNTER — Telehealth: Payer: Self-pay | Admitting: Gastroenterology

## 2023-04-19 ENCOUNTER — Other Ambulatory Visit: Payer: Self-pay

## 2023-04-19 NOTE — Telephone Encounter (Signed)
Patient returned your call, please advise. 

## 2023-04-20 ENCOUNTER — Telehealth: Payer: Self-pay

## 2023-04-20 MED ORDER — RIFAXIMIN 550 MG PO TABS: 550.0000 mg | ORAL_TABLET | Freq: Three times a day (TID) | ORAL | 0 refills | Status: DC

## 2023-04-20 MED ORDER — METRONIDAZOLE 250 MG PO TABS: 250.0000 mg | ORAL_TABLET | Freq: Three times a day (TID) | ORAL | 0 refills | Status: AC

## 2023-04-20 NOTE — Telephone Encounter (Signed)
Pt called back into the office, informed me that Rifaximin will need a PA and with his experience it will likely not be approved. Pt prefers to proceed with Metronidazole prescription in said. Pt knows to call about 3 weeks after he completes treatment. Pt verbalized understanding.

## 2023-04-20 NOTE — Telephone Encounter (Signed)
Returned call to patient - see 04/06/23 pathology results

## 2023-04-20 NOTE — Telephone Encounter (Signed)
-----   Message from Kyle Velazquez sent at 04/19/2023  2:07 PM EDT ----- [name],   Please let him know that his small intestine biopsies were normal, confirming that his celiac condition is under excellent control on his gluten-free diet.  I suspect he either has some irritable bowel or perhaps small intestinal bacterial overgrowth (which we briefly discussed after the procedures). SIBO difficult to diagnose, breath testing not entirely reliable.  I favor empiric treatment given his symptoms and underlying diabetes.  Rifaximin 550 mg tablet 1 tablet 3 times daily for 14 days Dispense 42 tablets, refill 0  If declined by insurance or primitively expensive, alternative treatment is:  Metronidazole 250 mg tablet 1 tablet 3 times daily for 10 days Dispense 30 tablets, refill 0  Would like to hear from him several weeks after finishing treatment to note there has been any improvement in symptoms.  HD  ______________________________  Baxter Hire,    No path letter needed.   - HD

## 2023-04-25 ENCOUNTER — Other Ambulatory Visit (HOSPITAL_COMMUNITY): Payer: Self-pay

## 2023-04-25 ENCOUNTER — Telehealth: Payer: Self-pay | Admitting: Pharmacy Technician

## 2023-04-25 NOTE — Telephone Encounter (Signed)
Pharmacy Patient Advocate Encounter   Received notification from CoverMyMeds that prior authorization for [place] 550MG  is required/requested.   Insurance verification completed.   The patient is insured through [facility] .   Per test claim: PA required; PA submitted to [employer] [facility] via CoverMyMeds Key/confirmation #/EOC [id number] Status is pending

## 2023-04-26 ENCOUNTER — Ambulatory Visit: Payer: Medicare Other | Admitting: Podiatrist

## 2023-04-26 DIAGNOSIS — L603 Nail dystrophy: Secondary | ICD-10-CM

## 2023-04-26 NOTE — Progress Notes (Signed)
Patient presents to the office today for recheck of his dystrophic toenails, hallux bilateral.   After a quick discussion when rooming the patient, he would like to have both big toenails removed permanently.   He will follow up with Dr. Al Corpus in 2-3 weeks for this procedure after he returns from his trip to [place].

## 2023-04-26 NOTE — Progress Notes (Signed)
Agree with note by Morrie Sheldon-  will return for permanent phenol matrixectomy with Dr. Al Corpus after his trip.

## 2023-05-03 NOTE — Telephone Encounter (Signed)
Pharmacy Patient Advocate Encounter  Received notification from [facility] that Prior Authorization for Xifaxan 550mg  has been DENIED. Please advise how you'd like to proceed. Full denial letter will be uploaded to the media tab. See denial reason below.

## 2023-05-04 NOTE — Telephone Encounter (Signed)
Patient has been treated with alternative treatment

## 2023-05-20 ENCOUNTER — Other Ambulatory Visit: Payer: Self-pay | Admitting: Podiatry

## 2023-05-24 ENCOUNTER — Ambulatory Visit (INDEPENDENT_AMBULATORY_CARE_PROVIDER_SITE_OTHER): Payer: Medicare Other | Admitting: Podiatry

## 2023-05-24 DIAGNOSIS — G5790 Unspecified mononeuropathy of unspecified lower limb: Secondary | ICD-10-CM

## 2023-05-24 NOTE — Progress Notes (Signed)
He presents today states that his toes are only painful at the very tips of the toes that does not seem to be the nail that is boggy and they are just at the very tip.  Objective: Vital signs are stable alert oriented x 3 he is not taking the Lyrica states that it made him feel goofy.  So he discontinued it.  Tenderness is noted on palpation of the distal tuft possibly where the nail used to be before was removed but now it is tender.  Assessment: Tenderness distal tuft of the toes hallux bilateral possible neuropathy.  Plan: Currently we used silicone gels for the toe cover and I will follow-up with him in about 6 weeks for nail debridement and consider x-rays to those toes at that time.

## 2023-05-31 ENCOUNTER — Ambulatory Visit (INDEPENDENT_AMBULATORY_CARE_PROVIDER_SITE_OTHER): Payer: Medicare Other | Admitting: Psychiatry

## 2023-05-31 ENCOUNTER — Encounter: Payer: Self-pay | Admitting: Psychiatry

## 2023-05-31 DIAGNOSIS — F411 Generalized anxiety disorder: Secondary | ICD-10-CM | POA: Diagnosis not present

## 2023-05-31 MED ORDER — SERTRALINE HCL 25 MG PO TABS
ORAL_TABLET | ORAL | 3 refills | Status: DC
Start: 2023-05-31 — End: 2024-05-31

## 2023-05-31 MED ORDER — ALPRAZOLAM 1 MG PO TABS
0.5000 mg | ORAL_TABLET | Freq: Every day | ORAL | 5 refills | Status: DC
Start: 2023-05-31 — End: 2024-01-13

## 2023-05-31 NOTE — Progress Notes (Signed)
Kyle Velazquez [id number] 1955/11/14 67 y.o.   Subjective:   Patient ID:  Kyle Velazquez is a 67 y.o. (DOB 01/12/1956) male.  Chief Complaint:  Chief Complaint  Patient presents with   Follow-up   Anxiety    HPI Kyle Velazquez is treated for GAD.  seen 10/2019.  No meds were changed.  04/15/2020 appt with the following noted: Good summer.   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   A lot less stress. Worked out well.  No black cloud on Sunday afternoons anymore.   K[place] Velazquez is good.  [place] in [place]. [place] and her BF in [place] moved back to [place].  Bought Velaz[place] here [employer].   [place] Vel[place] Kyle Velazquez  just turned 67 yo and they moved to [place].  Got to see kids at holidays.  Son doing really well with big promotion.   No other complaints.  Doesn't have control over the time line.  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  Satisfied with meds.   Down to 2 and 1/2 tablets of Xanax daily from 4 and 1/2 tablets daily. Anxiety manageable.  Chronic issues.  Does well with the meds.  Aware of risks the Bz.   Patient reports stable mood and denies depressed or irritable moods.  Patient denies any recent difficulty with anxiety.  Patient denies difficulty with sleep initiation or maintenance. Denies appetite disturbance.  Patient reports that energy and motivation have been good.  Patient denies any difficulty with concentration.  Patient denies any suicidal ideation.   12/15/2020 appt noted: Doing great in retirement.  So great to have that off him.  Ready to retire this time.  No complaints.  No black clouds. No problems with meds. Patient reports stable mood and denies depressed or irritable moods.  Patient denies any recent difficulty with anxiety.  Patient denies difficulty with sleep initiation or maintenance. Denies appetite disturbance.  Patient reports that energy and motivation have been good.  Patient denies any difficulty with  concentration.  Patient denies any suicidal ideation. Satisfied with sertraline 25 mg.  Celiac dx for 3 years.If eats clean GI is OK.  So not related to sertraline. Plan: Therefore no med changes today but consider stoppting sertraline at FU  06/16/21 appt noted: Good summer.  Son moved to [place].  [place] Velazquez pregnant.  Did [place] open trip.  Celiac dz is a problem eating.  Been to [place] and enjoyed it. Things doing well.  Loves being retired.  Helping a little in work. Anxiety is good.  Patient reports stable mood and denies depressed or irritable moods.  Patient denies any recent difficulty with anxiety.  Patient denies difficulty with sleep initiation or maintenance. Denies appetite disturbance.  Patient reports that energy and motivation have been good.  Patient denies any difficulty with concentration.  Patient denies any suicidal ideation. Don't see reason to go off or up on meds.  04/15/22 appt noted: [place] to [place]. Able to enjoy the trip. Brought Covid home.  Kyle Velazquez got sick coming home. She still has Covid cough.  Gets fevers and they leave 3 weeks later.    He got sick a couple of days later. He's over it.   Anxiety is manageable. Helping [place] with [place]. GS new one is 49 mos old.  Gets to see them often. Couple more trips planned.  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   Plan: Therefore no med changes today  Continue sertraline 25 mg daily Xanax 0.5 mg 5 times daily  05/31/23 appt noted: 3 gkids.   [place] Velaz[place] pregnant.   Consistent with meds and works well for him.  For anxiety.  Asked about the purpose of the sertraline.  No SE of concern.   Sleep is ok except over DM. Also has celiac for the last 3 years.  [employer].   Very restrictive on what he can eat.    No SE with meds.  Past Psychiatric Medication Trials: Sertraline, Lexapro, buspirone, Xanax with a long history of 1 mg 4 times daily.  He has been able to reduce it to half a milligram 5  times daily Under my psychiatric care since 2002  Review of Systems:  Review of Systems  Cardiovascular:  Negative for palpitations.  Neurological:  Negative for tremors and weakness.  Psychiatric/Behavioral:  Negative for agitation, behavioral problems, confusion, decreased concentration, dysphoric mood, hallucinations, self-injury, sleep disturbance and suicidal ideas. The patient is not nervous/anxious and is not hyperactive.     Medications: I have reviewed the patient's current medications.  Current Outpatient Medications  Medication Sig Dispense Refill   amLODipine (NORVASC) 10 MG tablet Take 5 mg by mouth daily.     Continuous Blood Gluc Sensor (FREESTYLE LIBRE 14 DAY SENSOR) MISC by Does not apply route.     fluticasone (FLONASE) 50 MCG/ACT nasal spray Place 2 sprays into both nostrils at bedtime as needed for allergies or rhinitis.     glucose blood test strip 1 each by Other route as needed for other. Use as instructed     hydrochlorothiazide (HYDRODIURIL) 25 MG tablet Take 1 tablet (25 mg total) by mouth daily. Patient needs appointment for any future refills.  Please call office at [phone] to schedule appointment. 1st attempt. 90 tablet 3   hydrocortisone 2.5 % cream Apply 1 application. topically 2 (two) times daily as needed (skin irritation).     insulin aspart (NOVOLOG) 100 UNIT/ML injection Inject 0-70 Units into the skin See admin instructions. Inject up to 70 units daily via pump on sliding scale     omeprazole (PRILOSEC) 40 MG capsule Take 40 mg by mouth daily.     PERCOCET 10-325 MG tablet Take 1 tablet by mouth every 6 (six) hours as needed for pain.  0   pregabalin (LYRICA) 50 MG capsule Take 1 capsule (50 mg total) by mouth 3 (three) times daily. 60 capsule 3   sildenafil (VIAGRA) 100 MG tablet Take 1 tablet (100 mg total) by mouth daily as needed for erectile dysfunction. 30 tablet 3   terbinafine (LAMISIL) 250 MG tablet PLEASE TAKE 1 TABLET A DAY X 7 DAYS,  REPEAT EVERY 4 WEEKS X 4 MONTHS 28 tablet 0   Testosterone 20.25 MG/ACT (1.62%) GEL Apply 2 Pump topically every other day.     valACYclovir (VALTREX) 500 MG tablet Take 500 mg by mouth daily.     valsartan (DIOVAN) 320 MG tablet Take 1 tablet (320 mg total) by mouth daily. 90 tablet 3   ALPRAZolam (XANAX) 1 MG tablet Take 0.5 tablets (0.5 mg total) by mouth 5 (five) times daily. 75 tablet 5   sertraline (ZOLOFT) 25 MG tablet TAKE 1 TABLET(25 MG) BY MOUTH DAILY 90 tablet 3   No current facility-administered medications for this visit.    Medication Side Effects: None; no sedation.  Allergies: No Known Allergies  Past Medical History:  Diagnosis Date  Celiac disease    Chronic back pain    Chronic shoulder pain    Diabetes 1.5, managed as type 2 (HCC)    Diabetic neuropathy (HCC)    Hyperlipidemia    Hypertension    Internal hemorrhoids     Family History  Problem Relation Age of Onset   CAD Mother    CAD Father    Colon cancer Neg Hx    Stomach cancer Neg Hx    Throat cancer Neg Hx    Rectal cancer Neg Hx     Social History   Socioeconomic History   Marital status: Married    Spouse name: Not on file   Number of children: Not on file   Years of education: Not on file   Highest education level: Not on file  Occupational History   Not on file  Tobacco Use   Smoking status: Former   Smokeless tobacco: Never  Vaping Use   Vaping status: Never Used  Substance and Sexual Activity   Alcohol use: No   Drug use: No   Sexual activity: Yes  Other Topics Concern   Not on file  Social History Narrative   Not on file   Social Determinants of Health   Financial Resource Strain: Not on file  Food Insecurity: Low Risk  (04/01/2023)   Received from [facility]   Hunger Vital Sign    Worried About Running Out of Food in the Last Year: Never true    Ran Out of Food in the Last Year: Never true  Transportation Needs: Not on file (04/01/2023)  Physical Activity: Not on  file  Stress: Not on file  Social Connections: Unknown (01/26/2022)   Received from [facility], [facility]   Social Network    Social Network: Not on file  Intimate Partner Violence: Unknown (12/18/2021)   [facility], [facility]   HITS    Physically Hurt: Not on file    Insult or Talk Down To: Not on file    Threaten Physical Harm: Not on file    Scream or Curse: Not on file    Past Medical History, Surgical history, Social history, and Family history were reviewed and updated as appropriate.   Please see review of systems for further details on the patient's review from today.   Objective:   Physical Exam:  There were no vitals taken for this visit.  Physical Exam Constitutional:      General: He is not in acute distress. Musculoskeletal:        General: No deformity.  Neurological:     Mental Status: He is alert and oriented to person, place, and time.     Cranial Nerves: No dysarthria.     Coordination: Coordination normal.  Psychiatric:        Attention and Perception: Attention and perception normal. He does not perceive auditory or visual hallucinations.        Mood and Affect: Mood normal. Mood is not anxious or depressed. Affect is not labile, blunt or angry.        Speech: Speech normal.        Behavior: Behavior normal. Behavior is cooperative.        Thought Content: Thought content normal. Thought content is not paranoid or delusional. Thought content does not include homicidal or suicidal ideation. Thought content does not include suicidal plan.        Cognition and Memory: Cognition and memory normal.  Judgment: Judgment normal.     Comments: Insight intact Further reduction in anxiety with the pending end of job stress.     Lab Review:     Component Value Date/Time   NA 134 02/09/2022 1150   K 4.4 02/09/2022 1150   CL 95 (L) 02/09/2022 1150   CO2 28 02/09/2022 1150   GLUCOSE 118 (H) 02/09/2022 1150   GLUCOSE 195 (H)  11/24/2021 0457   BUN 12 02/09/2022 1150   CREATININE 0.78 02/09/2022 1150   CALCIUM 10.1 02/09/2022 1150   PROT 7.9 11/24/2021 0457   ALBUMIN 2.8 (L) 11/24/2021 0457   AST 28 11/24/2021 0457   ALT 38 11/24/2021 0457   ALKPHOS 161 (H) 11/24/2021 0457   BILITOT 0.6 11/24/2021 0457   GFRNONAA >60 11/24/2021 0457   GFRAA 113 01/15/2020 0939       Component Value Date/Time   WBC 8.0 11/24/2021 0457   RBC 3.94 (L) 11/24/2021 0457   HGB 12.4 (L) 11/24/2021 0457   HCT 36.9 (L) 11/24/2021 0457   PLT 638 (H) 11/24/2021 0457   MCV 93.7 11/24/2021 0457   MCH 31.5 11/24/2021 0457   MCHC 33.6 11/24/2021 0457   RDW 14.1 11/24/2021 0457   LYMPHSABS 1.8 11/24/2021 0457   MONOABS 0.8 11/24/2021 0457   EOSABS 0.4 11/24/2021 0457   BASOSABS 0.1 11/24/2021 0457    No results found for: "POCLITH", "LITHIUM"   No results found for: "PHENYTOIN", "PHENOBARB", "VALPROATE", "CBMZ"   .res Assessment: Plan:    Devin was seen today for follow-up and anxiety.  Diagnoses and all orders for this visit:  Generalized anxiety disorder -     sertraline (ZOLOFT) 25 MG tablet; TAKE 1 TABLET(25 MG) BY MOUTH DAILY -     ALPRAZolam (XANAX) 1 MG tablet; Take 0.5 tablets (0.5 mg total) by mouth 5 (five) times daily.   We discussed the short-term risks associated with benzodiazepines including sedation and increased fall risk among others.  Discussed long-term side effect risk including dependence, potential withdrawal symptoms, and the potential eventual dose-related risk of dementia.  But recent studies from 2020 dispute this association between benzodiazepines and dementia risk. Newer studies in 2020 do not support an association with dementia. Disc added risks in combo with opiates.  He's having no SE and doesn't want to change  Benefit from meds.  No complaints.  Tolerating.  Benefit with sertraline and satisfied.  Discussed the pros and cons of potentially weaning the sertraline at his next visit.  He  does not feel that he can wean the alprazolam with or without sertraline presents.  He did well for years with just alprazolam until business stress got out of hand.  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   Therefore no med changes today  Continue sertraline 25 mg daily Xanax 0.5 mg 5 times daily  FU 12 mos bc stability  Meredith Staggers, MD, DFAPA   Please see After Visit Summary for patient specific instructions.  Future Appointments  Date Time Provider Department Center  07/14/2023 10:45 AM [place], [place] [facility] [facility]  08/02/2023  3:20 PM [place], Deloris Ping, MD [facility]     No orders of the defined types were placed in this encounter.     -------------------------------

## 2023-06-01 ENCOUNTER — Telehealth: Payer: Self-pay | Admitting: Gastroenterology

## 2023-06-01 NOTE — Telephone Encounter (Signed)
Called and spoke with patient regarding information below. Pt will try IB Gard PRN. Pt verbalized understanding and had no concerns at the end of the call.

## 2023-06-01 NOTE — Telephone Encounter (Signed)
Called and spoke with patient. Pt states that he feels the same, no better or worse after taking Metronidazole. Pt states that his symptoms are normal for him due to his celiac disease and diabetes. Pt is able to tolerate symptoms, he mentioned being open to trying a PRN medication if you recommended anything. I told pt that he could try IB Delene Ruffini OTC, I told him that I will also check with you so that you can further advise. Pt also stated that he would come in for an office visit if it is needed. Please advise, thanks.

## 2023-06-01 NOTE — Telephone Encounter (Signed)
Pateint called to provide you wit an update on the Metronidazole medication.

## 2023-06-01 NOTE — Telephone Encounter (Signed)
Sounds like he probably does not have SIBO.  The day of his procedures, I believe I did mention IB gard to his wife as something to try.  This might be something meds have a limited ability to improve.  - HD

## 2023-07-14 ENCOUNTER — Ambulatory Visit: Payer: Medicare Other | Admitting: Podiatry

## 2023-07-14 DIAGNOSIS — M778 Other enthesopathies, not elsewhere classified: Secondary | ICD-10-CM

## 2023-07-31 ENCOUNTER — Encounter: Payer: Self-pay | Admitting: Cardiovascular Disease

## 2023-07-31 NOTE — Progress Notes (Unsigned)
Cardiology Office Note:    Date:  08/03/2023   ID:  K[place] Vel[place], DOB 1956/04/01, MRN [id number]  PCP:  Adrian Prince, MD  Cardiologist:  Kinslei Labine  Electrophysiologist:  None   Referring MD: Adrian Prince, MD   Chief Complaint  Patient presents with   Hypertension        Hyperlipidemia    Previous notes:   Kyle Vel[place] is a 67 y.o. male with a hx of hypertension .  I saw him via telemedicine last year.  He has been exercising regularly . Exercised regularly   Did have 1 episode of DOE while walking to his car - a month ago  Has not occurred since. No CP.  Jan. 4, 2023 [place] is seen for follow up of his HTN. BP is a bit elevated.  120-160 at home Has celiac and DM so he eats lots of soup  ( makes his own soup)  BP is a bit elevated  Will DC ramipril Start valsartan 160 mg  a day    Nov. 20, 2023 [place] is seen for follow up of his HTN  Exercises daily  No CP , no dysnea  Some ED issues  Will give printed script for sildenafil 100 mg  Encouraged him to use [facility]    Nov. 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  No CP, no dyspnea        Past Medical History:  Diagnosis Date   Celiac disease    Chronic back pain    Chronic shoulder pain    Diabetes 1.5, managed as type 2 (HCC)    Diabetic neuropathy (HCC)    Hyperlipidemia    Hypertension    Internal hemorrhoids     Past Surgical History:  Procedure Laterality Date   COLONOSCOPY  2007 2016   Dr Kinnie Scales    ESOPHAGOGASTRODUODENOSCOPY  2011   Dr [facility]    NECK SURGERY     SHOULDER SURGERY  2009   TEE WITHOUT CARDIOVERSION N/A 11/23/2021   Procedure: TRANSESOPHAGEAL ECHOCARDIOGRAM (TEE);  Surgeon: Little Ishikawa, MD;  Location[facility] END[facility]COPY;  Service: Cardiovascular;  Laterality: N/A;    Current Medications: Current Meds   Medication Sig   ALPRAZolam (XANAX) 1 MG tablet Take 0.5 tablets (0.5 mg total) by mouth 5 (five) times daily.   amLODipine (NORVASC) 10 MG tablet Take 5 mg by mouth daily.   Continuous Blood Gluc Sensor (FREESTYLE LIBRE 14 DAY SENSOR) MISC by Does not apply route.   fluticasone (FLONASE) 50 MCG/ACT nasal spray Place 2 sprays into both nostrils at bedtime as needed for allergies or rhinitis.   glucose blood test strip 1 each by Other route as needed for other. Use as instructed   hydrochlorothiazide (HYDRODIURIL) 25 MG tablet Take 1 tablet (25 mg total) by mouth daily. Patient needs appointment for any future refills.  Please call office at [phone] to schedule appointment. 1st attempt.   hydrocortisone 2.5 % cream Apply 1 application. topically 2 (two) times daily as needed (skin irritation).   insulin aspart (NOVOLOG) 100 UNIT/ML injection Inject 0-70 Units into the skin See admin instructions. Inject up to 70 units daily via pump on sliding scale   omeprazole (PRILOSEC) 40 MG capsule Take 40 mg by mouth daily.   PERCOCET 10-325 MG tablet Take 1 tablet by  mouth every 6 (six) hours as needed for pain.   sertraline (ZOLOFT) 25 MG tablet TAKE 1 TABLET(25 MG) BY MOUTH DAILY   sildenafil (VIAGRA) 100 MG tablet Take 1 tablet (100 mg total) by mouth daily as needed for erectile dysfunction.   Testosterone 20.25 MG/ACT (1.62%) GEL Apply 2 Pump topically every other day.   valACYclovir (VALTREX) 500 MG tablet Take 500 mg by mouth daily.   valsartan (DIOVAN) 320 MG tablet Take 1 tablet (320 mg total) by mouth daily.     Allergies:   Patient has no known allergies.   Social History   Socioeconomic History   Marital status: Married    Spouse name: Not on file   Number of children: Not on file   Years of education: Not on file   Highest education level: Not on file  Occupational History   Not on file  Tobacco Use   Smoking status: Former   Smokeless tobacco: Never  Vaping Use   Vaping  status: Never Used  Substance and Sexual Activity   Alcohol use: No   Drug use: No   Sexual activity: Yes  Other Topics Concern   Not on file  Social History Narrative   Not on file   Social Determinants of Health   Financial Resource Strain: Not on file  Food Insecurity: Low Risk  (04/01/2023)   Received from [facility]   Hunger Vital Sign    Worried About Running Out of Food in the Last Year: Never true    Ran Out of Food in the Last Year: Never true  Transportation Needs: Not on file (04/01/2023)  Physical Activity: Not on file  Stress: Not on file  Social Connections: Unknown (01/26/2022)   Received from [facility], [facility]   Social Network    Social Network: Not on file     Family History: The patient's family history includes CAD in his father and mother. There is no history of Colon cancer, Stomach cancer, Throat cancer, or Rectal cancer.  ROS:   Please see the history of present illness.     All other systems reviewed and are negative.  EKGs/Labs/Other Studies Reviewed:    The following studies were reviewed today:   Recent Labs: No results found for requested labs within last 365 days.  Recent Lipid Panel No results found for: "CHOL", "TRIG", "HDL", "CHOLHDL", "VLDL", "LDLCALC", "LDLDIRECT"  Physical Exam:     Physical Exam: Blood pressure 110/70, pulse (!) 59, height 5\' 7"  (1.702 m), weight 141 lb 12.8 oz (64.3 kg), SpO2 98%.       GEN:  Well nourished, well developed in no acute distress HEENT: Normal NECK: No JVD; No carotid bruits LYMPHATICS: No lymphadenopathy CARDIAC: RRR , no murmurs, rubs, gallops RESPIRATORY:  Clear to auscultation without rales, wheezing or rhonchi  ABDOMEN: Soft, non-tender, non-distended MUSCULOSKELETAL:  No edema; No deformity  SKIN: Warm and dry NEUROLOGIC:  Alert and oriented x 3    EKG:    EKG Interpretation Date/Time:  Tuesday August 02 2023 15:24:57 EST Ventricular Rate:  59 PR  Interval:  164 QRS Duration:  94 QT Interval:  426 QTC Calculation: 421 R Axis:   93  Text Interpretation: Sinus bradycardia with marked sinus arrhythmia with occasional Premature ventricular complexes Rightward axis When compared with ECG of 16-Nov-2021 14:36, the rare PVCs are now present Confirmed by Kristeen Miss ([id number]) on 08/02/2023 4:07:01 PM    ASSESSMENT:    1. Follow-up exam   2.  Essential hypertension       PLAN:      HTN:    BP is well controlled.  Cont current meds.  Is very active Exercises regulary     Will have him follow up with dr. Excell Seltzer       Medication Adjustments/Labs and Tests Ordered: Current medicines are reviewed at length with the patient today.  Concerns regarding medicines are outlined above.  Orders Placed This Encounter  Procedures   EKG 12-Lead   No orders of the defined types were placed in this encounter.    Patient Instructions  Follow-Up: At [facility], you and your health needs are our priority.  As part of our continuing mission to provide you with exceptional heart care, we have created designated Provider Care Teams.  These Care Teams include your primary Cardiologist (physician) and Advanced Practice Providers (APPs -  Physician Assistants and Nurse Practitioners) who all work together to provide you with the care you need, when you need it.  Your next appointment:   1 year(s)  Provider:   Tonny Bollman, MD     Signed, Kristeen Miss, MD  08/03/2023 7:54 PM    [facility]

## 2023-08-02 ENCOUNTER — Encounter: Payer: Self-pay | Admitting: Cardiovascular Disease

## 2023-08-02 ENCOUNTER — Ambulatory Visit: Payer: Medicare Other | Admitting: Podiatry

## 2023-08-02 ENCOUNTER — Ambulatory Visit: Payer: Medicare Other | Attending: Cardiovascular Disease | Admitting: Cardiovascular Disease

## 2023-08-02 VITALS — BP 110/70 | HR 59 | Ht 67.0 in | Wt 141.8 lb

## 2023-08-02 DIAGNOSIS — I10 Essential (primary) hypertension: Secondary | ICD-10-CM | POA: Diagnosis not present

## 2023-08-02 DIAGNOSIS — Z09 Encounter for follow-up examination after completed treatment for conditions other than malignant neoplasm: Secondary | ICD-10-CM

## 2023-08-02 NOTE — Patient Instructions (Signed)

## 2023-08-16 ENCOUNTER — Other Ambulatory Visit: Payer: Self-pay | Admitting: Podiatry

## 2023-08-16 ENCOUNTER — Ambulatory Visit (INDEPENDENT_AMBULATORY_CARE_PROVIDER_SITE_OTHER): Payer: Medicare Other

## 2023-08-16 ENCOUNTER — Encounter: Payer: Self-pay | Admitting: Podiatry

## 2023-08-16 ENCOUNTER — Ambulatory Visit (INDEPENDENT_AMBULATORY_CARE_PROVIDER_SITE_OTHER): Payer: Medicare Other | Admitting: Podiatry

## 2023-08-16 DIAGNOSIS — M899 Disorder of bone, unspecified: Secondary | ICD-10-CM

## 2023-08-16 DIAGNOSIS — E1142 Type 2 diabetes mellitus with diabetic polyneuropathy: Secondary | ICD-10-CM

## 2023-08-16 NOTE — Progress Notes (Unsigned)
He presents today still with painful hallux bilateral.  States that the tuft of the toe is still exquisitely painful.  Objective: Vital signs are stable alert oriented x 3.  There is no erythema edema salines drainage odor just tenderness on palpation of the tuft of the toe.  Radiographs taken today do demonstrate a small dorsal exostosis to the right hallux but none on the left hallux.  Assessment: Most likely diabetic peripheral neuropathy with the hallux nails being more symptomatic than the rest of the nails or the rest of the symptomatic foot.  Plan: I am at this point going to refer to neurology for a workup of diabetic peripheral neuropathy.  I feel that his hallux having had the nails removed a year ago may have triggered more painful neuropathy to that area.  There is no signs of infection no signs of bone infection no signs of trauma other than the nail plate being removed.

## 2023-11-21 ENCOUNTER — Ambulatory Visit: Payer: Medicare Other | Admitting: Diagnostic Neuroimaging

## 2024-01-12 ENCOUNTER — Other Ambulatory Visit: Payer: Self-pay | Admitting: Psychiatry

## 2024-01-12 DIAGNOSIS — F411 Generalized anxiety disorder: Secondary | ICD-10-CM

## 2024-01-30 ENCOUNTER — Encounter: Payer: Self-pay | Admitting: Diagnostic Neuroimaging

## 2024-01-30 ENCOUNTER — Ambulatory Visit: Admitting: Diagnostic Neuroimaging

## 2024-01-30 VITALS — BP 142/62 | HR 50 | Ht 66.0 in | Wt 136.0 lb

## 2024-01-30 DIAGNOSIS — E1042 Type 1 diabetes mellitus with diabetic polyneuropathy: Secondary | ICD-10-CM | POA: Diagnosis not present

## 2024-01-30 DIAGNOSIS — M79676 Pain in unspecified toe(s): Secondary | ICD-10-CM | POA: Diagnosis not present

## 2024-01-30 NOTE — Patient Instructions (Addendum)
  PAINFUL GREAT TOES (h/o right ingrown toenail; possible fungal nail infx; s/p bilateral toenail procedure in May 2023, with worsening post-procedure pain; redness, numbness, pain; possible complex regional pain syndrome) - pain mgmt per podiatry or pain mgmt clinic - consider gabapentin , lyrica , cymbalta  DIABETIC NEUROPATHY - mild numbness in fingers and feet x ~5+ years; overall stable; A1c have been stable for several years

## 2024-01-30 NOTE — Progress Notes (Signed)
 [facility]  PATIENT: Kyle Velazquez DOB: 06/23/1956  REFERRING CLINICIAN: Hyatt, Max T, DPM HISTORY FROM: patient  REASON FOR VISIT: new consult   HISTORICAL  CHIEF COMPLAINT:  Chief Complaint  Patient presents with   New Patient (Initial Visit)    RM 7, Pt alone. Referred by PCP for neuropathy, bilateral feet and toes. Pt states 2 years ago had toenails removed from bilateral 1st digits and area was infected. States the numbness has occurred since that time. Pt states he has taken gabapentin  in the past.States also has tingling in fingers of both hands.    HISTORY OF PRESENT ILLNESS:   68 year old male with history of type 1 diabetes here for evaluation of bilateral great toe pain.  Patient has history of type 1 diabetes for almost 28 years.  A1c's have been fairly stable in the low 7, upper 6 range.  Developed some numbness and tingling in the feet and fingers around 5 years ago.  This has been fairly stable.  In 2023 had a right great toe ingrown nail which was causing some pain and discomfort.  He went to podiatry and they treated this with bilateral great toe nail procedures.  There was some concern of bilateral great toe fungal infections at that time as well.  Following the procedures his pain in the great toe was greatly increased.  Nowadays he has severe sharp pain, redness, sensitivity in the great toes.  The other areas of his feet and fingers are stable.  Patient is on chronic pain management with Percocet for neck and back pain.  Had tried gabapentin  for his foot and toe pain but he felt some side effects from this in the past.  Has tried some topical creams including lidocaine with minimal relief.   REVIEW OF SYSTEMS: Full 14 system review of systems performed and negative with exception of: as per hPI.  ALLERGIES: No Known Allergies  HOME MEDICATIONS: Outpatient Medications Prior to Visit  Medication Sig Dispense Refill   ALPRAZolam  (XANAX )  1 MG tablet TAKE 0.5 TABLETS BY MOUTH 5 TIMES DAILY. 75 tablet 3   amLODipine  (NORVASC ) 10 MG tablet Take 5 mg by mouth daily.     Continuous Blood Gluc Sensor (FREESTYLE LIBRE 14 DAY SENSOR) MISC by Does not apply route.     fluticasone (FLONASE) 50 MCG/ACT nasal spray Place 2 sprays into both nostrils at bedtime as needed for allergies or rhinitis.     glucose blood test strip 1 each by Other route as needed for other. Use as instructed     hydrochlorothiazide  (HYDRODIURIL ) 25 MG tablet Take 1 tablet (25 mg total) by mouth daily. Patient needs appointment for any future refills.  Please call office at [phone] to schedule appointment. 1st attempt. 90 tablet 3   hydrocortisone  2.5 % cream Apply 1 application. topically 2 (two) times daily as needed (skin irritation).     insulin  aspart (NOVOLOG ) 100 UNIT/ML injection Inject 0-70 Units into the skin See admin instructions. Inject up to 70 units daily via pump on sliding scale     omeprazole (PRILOSEC) 40 MG capsule Take 40 mg by mouth daily.     PERCOCET 10-325 MG tablet Take 1 tablet by mouth every 6 (six) hours as needed for pain.  0   sertraline  (ZOLOFT ) 25 MG tablet TAKE 1 TABLET(25 MG) BY MOUTH DAILY 90 tablet 3   sildenafil  (VIAGRA ) 100 MG tablet Take 1 tablet (100 mg total) by mouth daily as needed for erectile dysfunction.  30 tablet 3   valACYclovir  (VALTREX ) 500 MG tablet Take 500 mg by mouth daily.     valsartan  (DIOVAN ) 320 MG tablet Take 1 tablet (320 mg total) by mouth daily. 90 tablet 3   pregabalin  (LYRICA ) 50 MG capsule Take 1 capsule (50 mg total) by mouth 3 (three) times daily. 60 capsule 3   terbinafine  (LAMISIL ) 250 MG tablet PLEASE TAKE 1 TABLET A DAY X 7 DAYS, REPEAT EVERY 4 WEEKS X 4 MONTHS 28 tablet 0   Testosterone  20.25 MG/ACT (1.62%) GEL Apply 2 Pump topically every other day. (Patient not taking: Reported on 01/30/2024)     No facility-administered medications prior to visit.    PAST MEDICAL HISTORY: Past Medical  History:  Diagnosis Date   Celiac disease    Chronic back pain    Chronic shoulder pain    Diabetes 1.5, managed as type 2 (HCC)    Diabetic neuropathy (HCC)    Hyperlipidemia    Hypertension    Internal hemorrhoids     PAST SURGICAL HISTORY: Past Surgical History:  Procedure Laterality Date   COLONOSCOPY  2007 2016   Dr Andriette Keeling    ESOPHAGOGASTRODUODENOSCOPY  2011   Dr [facility]    NECK SURGERY     SHOULDER SURGERY  2009   TEE WITHOUT CARDIOVERSION N/A 11/23/2021   Procedure: TRANSESOPHAGEAL ECHOCARDIOGRAM (TEE);  Surgeon: Wendie Hamburg, MD;  Location: [facility] ENDOSCOPY;  Service: Cardiovascular;  Laterality: N/A;    FAMILY HISTORY: Family History  Problem Relation Age of Onset   CAD Mother    CAD Father    Colon cancer Neg Hx    Stomach cancer Neg Hx    Throat cancer Neg Hx    Rectal cancer Neg Hx     SOCIAL HISTORY: Social History   Socioeconomic History   Marital status: Married    Spouse name: Not on file   Number of children: Not on file   Years of education: Not on file   Highest education level: Not on file  Occupational History   Not on file  Tobacco Use   Smoking status: Former   Smokeless tobacco: Never  Vaping Use   Vaping status: Never Used  Substance and Sexual Activity   Alcohol  use: No   Drug use: No   Sexual activity: Yes  Other Topics Concern   Not on file  Social History Narrative   Not on file   Social Drivers of Health   Financial Resource Strain: Not on file  Food Insecurity: Low Risk  (04/01/2023)   Received from [facility]   Hunger Vital Sign    Worried About Running Out of Food in the Last Year: Never true    Ran Out of Food in the Last Year: Never true  Transportation Needs: Not on file (04/01/2023)  Physical Activity: Not on file  Stress: Not on file  Social Connections: Unknown (01/26/2022)   Received from [facility]   Social Network    Social Network: Not on file  Intimate Partner Violence:  Unknown (12/18/2021)   Received from [facility]   HITS    Physically Hurt: Not on file    Insult or Talk Down To: Not on file    Threaten Physical Harm: Not on file    Scream or Curse: Not on file     PHYSICAL EXAM  GENERAL EXAM/CONSTITUTIONAL: Vitals:  Vitals:   01/30/24 1025  BP: (!) 142/62  Pulse: (!) 50  Weight:  136 lb (61.7 kg)  Height: 5\' 6"  (1.676 m)   Body mass index is 21.95 kg/m. Wt Readings from Last 3 Encounters:  01/30/24 136 lb (61.7 kg)  08/02/23 141 lb 12.8 oz (64.3 kg)  04/06/23 142 lb (64.4 kg)   Patient is in no distress; well developed, nourished and groomed; neck is supple  CARDIOVASCULAR: Examination of carotid arteries is normal; no carotid bruits Regular rate and rhythm, no murmurs Examination of peripheral vascular system by observation and palpation is normal  EYES: Ophthalmoscopic exam of optic discs and posterior segments is normal; no papilledema or hemorrhages No results found.  MUSCULOSKELETAL: Gait, strength, tone, movements noted in Neurologic exam below  NEUROLOGIC: MENTAL STATUS:      No data to display         awake, alert, oriented to person, place and time recent and remote memory intact normal attention and concentration language fluent, comprehension intact, naming intact fund of knowledge appropriate  CRANIAL NERVE:  2nd - no papilledema on fundoscopic exam 2nd, 3rd, 4th, 6th - pupils equal and reactive to light, visual fields full to confrontation, extraocular muscles intact, no nystagmus 5th - facial sensation symmetric 7th - facial strength symmetric 8th - hearing intact 9th - palate elevates symmetrically, uvula midline 11th - shoulder shrug symmetric 12th - tongue protrusion midline  MOTOR:  normal bulk and tone, full strength in the BUE, BLE  SENSORY:  normal and symmetric to light touch, temperature, vibration; ABSENT AT ANKLES AND TOES  COORDINATION:  finger-nose-finger, fine  finger movements normal  REFLEXES:  deep tendon reflexes TRACE and symmetric  GAIT/STATION:  narrow based gait     DIAGNOSTIC DATA (LABS, IMAGING, TESTING) - I reviewed patient records, labs, notes, testing and imaging myself where available.  Lab Results  Component Value Date   WBC 8.0 11/24/2021   HGB 12.4 (L) 11/24/2021   HCT 36.9 (L) 11/24/2021   MCV 93.7 11/24/2021   PLT 638 (H) 11/24/2021      Component Value Date/Time   NA 134 02/09/2022 1150   K 4.4 02/09/2022 1150   CL 95 (L) 02/09/2022 1150   CO2 28 02/09/2022 1150   GLUCOSE 118 (H) 02/09/2022 1150   GLUCOSE 195 (H) 11/24/2021 0457   BUN 12 02/09/2022 1150   CREATININE 0.78 02/09/2022 1150   CALCIUM 10.1 02/09/2022 1150   PROT 7.9 11/24/2021 0457   ALBUMIN 2.8 (L) 11/24/2021 0457   AST 28 11/24/2021 0457   ALT 38 11/24/2021 0457   ALKPHOS 161 (H) 11/24/2021 0457   BILITOT 0.6 11/24/2021 0457   GFRNONAA >60 11/24/2021 0457   GFRAA 113 01/15/2020 0939   No results found for: "CHOL", "HDL", "LDLCALC", "LDLDIRECT", "TRIG", "CHOLHDL" Lab Results  Component Value Date   HGBA1C 6.8 (H) 11/17/2021   No results found for: "VITAMINB12" No results found for: "TSH"    ASSESSMENT AND PLAN  68 y.o. year old male here with:   Dx:  1. Pain of great toe, unspecified laterality   2. Diabetic polyneuropathy associated with type 1 diabetes mellitus (HCC)     PLAN:  PAINFUL GREAT TOES (h/o right ingrown toenail; possible fungal nail infx; s/p bilateral toenail procedure in May 2023, with worsening post-procedure pain; redness, numbness, pain; possible complex regional pain syndrome) - pain mgmt per podiatry or pain mgmt clinic - consider gabapentin , lyrica , cymbalta  DIABETIC NEUROPATHY - mild numbness in fingers and feet x ~5+ years; overall stable; A1c have been stable for several years  Return  for return to referring provider, return to PCP.    Omega Bible, MD 01/30/2024, 11:06 AM Certified  in Neurology, Neurophysiology and Neuroimaging  [facility] [address] [place], [place] [address] [phone]

## 2024-05-31 ENCOUNTER — Encounter: Payer: Self-pay | Admitting: Psychiatry

## 2024-05-31 ENCOUNTER — Ambulatory Visit: Payer: Medicare Other | Admitting: Psychiatry

## 2024-05-31 DIAGNOSIS — F411 Generalized anxiety disorder: Secondary | ICD-10-CM

## 2024-05-31 MED ORDER — ALPRAZOLAM 1 MG PO TABS: ORAL_TABLET | ORAL | 5 refills | Status: AC

## 2024-05-31 MED ORDER — SERTRALINE HCL 25 MG PO TABS: ORAL_TABLET | ORAL | 3 refills | Status: AC

## 2024-05-31 NOTE — Progress Notes (Signed)
 Kyle Velazquez [id number] [birth year] 68 y.o.   Subjective:   Patient ID:  Kyle Velazquez is a 68 y.o. (DOB 17-Mar-1956) male.  Chief Complaint:  Chief Complaint  Patient presents with   Follow-up   Anxiety    HPI Kyle Velazquez is treated for GAD.  seen 10/2019.  No meds were changed.  04/15/2020 appt with the following noted: Good summer.   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   A lot less stress. Worked out well.  No black cloud on Sunday afternoons anymore.   Kyle Velazquez is good.  Kyle Velazquez in [place]. Kyle Velazquez Kyle Velazquez and her BF in [place] moved back to [place].  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   Kyle Velazquez Early  just turned 68 yo and they moved to [place].  Got to see kids at holidays.  Son doing really well with big promotion.   No other complaints.  Doesn't have control over the time line.  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  Satisfied with meds.   Down to 2 and 1/2 tablets of Xanax  daily from 4 and 1/2 tablets daily. Anxiety manageable.  Chronic issues.  Does well with the meds.  Aware of risks the Bz.   Patient reports stable mood and denies depressed or irritable moods.  Patient denies any recent difficulty with anxiety.  Patient denies difficulty with sleep initiation or maintenance. Denies appetite disturbance.  Patient reports that energy and motivation have been good.  Patient denies any difficulty with concentration.  Patient denies any suicidal ideation.   12/15/2020 appt noted: Doing great in retirement.  So great to have that off him.  Ready to retire this time.  No complaints.  No black clouds. No problems with meds. Patient reports stable mood and denies depressed or irritable moods.  Patient denies any recent difficulty with anxiety.  Patient denies difficulty with sleep initiation or maintenance. Denies appetite disturbance.  Patient reports that energy and motivation have been good.  Patient denies any difficulty with  concentration.  Patient denies any suicidal ideation. Satisfied with sertraline  25 mg.  Celiac dx for 3 years.If eats clean GI is OK.  So not related to sertraline . Plan: Therefore no med changes today but consider stoppting sertraline  at FU  06/16/21 appt noted: Good summer.  Son moved to [place].  Kyle Velazquez pregnant.  Did [place].  Celiac dz is a problem eating.  Been to [place] and enjoyed it. Things doing well.  Loves being retired.  Helping a little in work. Anxiety is good.  Patient reports stable mood and denies depressed or irritable moods.  Patient denies any recent difficulty with anxiety.  Patient denies difficulty with sleep initiation or maintenance. Denies appetite disturbance.  Patient reports that energy and motivation have been good.  Patient denies any difficulty with concentration.  Patient denies any suicidal ideation. Don't see reason to go off or up on meds.  04/15/22 appt noted: Kyle Velazquez to [place]. Able to enjoy the trip. Brought Covid home.  Kyle Velazquez got sick coming home. She still has Covid cough.  Gets fevers and they leave 3 weeks later.    He got sick a couple of days later. He's over it.   Anxiety is manageable. Helping Kyle Velazquez with [employer]. GS new one is 64 mos old.  Gets to see them often. Couple more trips planned.  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   Plan: Therefore no med changes today  Continue sertraline  25 mg daily Xanax  0.5 mg 5 times daily  05/31/23 appt noted: 3 gkids.   Kyle Velazquez pregnant.   Consistent with meds and works well for him.  For anxiety.  Asked about the purpose of the sertraline .  No SE of concern.   Sleep is ok except over DM. Also has celiac for the last 3 years.  [employer].   Very restrictive on what he can eat.   Plan no change  05/31/24 appt noted: Med: sertraline  25, Xanax  0.5 mg 5 times daily. No SE 4 gkids 11 mos-10 yr.  Son Kyle Velazquez adopted. Helps coparent locally.  Doing good.  Everything  stable with anxiety.  Not a whole lot of worries.  Family healthy.  Kyle Velazquez is fine.   No panic.   Travelling some.  But limited DT celiac dz which is severe. Sx started 7 yr ago.  [place]  Enjoys it.   No SE with meds.  Past Psychiatric Medication Trials: Sertraline , Lexapro, buspirone, Xanax  with a long history of 1 mg 4 times daily.  He has been able to reduce it to half a milligram 5 times daily Under my psychiatric care since 2002  Review of Systems:  Review of Systems  Cardiovascular:  Negative for palpitations.  Neurological:  Negative for tremors and weakness.  Psychiatric/Behavioral:  Negative for agitation, behavioral problems, confusion, decreased concentration, dysphoric mood, hallucinations, self-injury, sleep disturbance and suicidal ideas. The patient is not nervous/anxious and is not hyperactive.     Medications: I have reviewed the patient's current medications.  Current Outpatient Medications  Medication Sig Dispense Refill   amLODipine  (NORVASC ) 10 MG tablet Take 5 mg by mouth daily.     Continuous Blood Gluc Sensor (FREESTYLE LIBRE 14 DAY SENSOR) MISC by Does not apply route.     fluticasone (FLONASE) 50 MCG/ACT nasal spray Place 2 sprays into both nostrils at bedtime as needed for allergies or rhinitis.     glucose blood test strip 1 each by Other route as needed for other. Use as instructed     hydrochlorothiazide  (HYDRODIURIL ) 25 MG tablet Take 1 tablet (25 mg total) by mouth daily. Patient needs appointment for any future refills.  Please call office at [phone] to schedule appointment. 1st attempt. 90 tablet 3   hydrocortisone  2.5 % cream Apply 1 application. topically 2 (two) times daily as needed (skin irritation).     insulin  aspart (NOVOLOG ) 100 UNIT/ML injection Inject 0-70 Units into the skin See admin instructions. Inject up to 70 units daily via pump on sliding scale     omeprazole (PRILOSEC) 40 MG capsule Take 40 mg by mouth daily.      PERCOCET 10-325 MG tablet Take 1 tablet by mouth every 6 (six) hours as needed for pain.  0   sildenafil  (VIAGRA ) 100 MG tablet Take 1 tablet (100 mg total) by mouth daily as needed for erectile dysfunction. 30 tablet 3   valACYclovir  (VALTREX ) 500 MG tablet Take 500 mg by mouth daily.     valsartan  (DIOVAN ) 320 MG tablet Take 1 tablet (320 mg total) by mouth daily. 90 tablet 3   ALPRAZolam  (XANAX ) 1 MG tablet TAKE 0.5 TABLETS BY MOUTH 5 TIMES DAILY. 75 tablet 5   sertraline  (ZOLOFT ) 25 MG tablet TAKE 1 TABLET(25 MG) BY MOUTH DAILY 90 tablet 3   No current facility-administered medications for this visit.    Medication Side  Effects: None; no sedation.  Allergies: No Known Allergies  Past Medical History:  Diagnosis Date   Celiac disease    Chronic back pain    Chronic shoulder pain    Diabetes 1.5, managed as type 2 (HCC)    Diabetic neuropathy (HCC)    Hyperlipidemia    Hypertension    Internal hemorrhoids     Family History  Problem Relation Age of Onset   CAD Mother    CAD Father    Colon cancer Neg Hx    Stomach cancer Neg Hx    Throat cancer Neg Hx    Rectal cancer Neg Hx     Social History   Socioeconomic History   Marital status: Married    Spouse name: Not on file   Number of children: Not on file   Years of education: Not on file   Highest education level: Not on file  Occupational History   Not on file  Tobacco Use   Smoking status: Former   Smokeless tobacco: Never  Vaping Use   Vaping status: Never Used  Substance and Sexual Activity   Alcohol  use: No   Drug use: No   Sexual activity: Yes  Other Topics Concern   Not on file  Social History Narrative   Not on file   Social Drivers of Health   Financial Resource Strain: Not on file  Food Insecurity: Low Risk  (04/01/2023)   Received from [facility]   Hunger Vital Sign    Within the past 12 months, you worried that your food would run out before you got money to buy more: Never true     Within the past 12 months, the food you bought just didn't last and you didn't have money to get more. : Never true  Transportation Needs: Not on file (04/01/2023)  Physical Activity: Not on file  Stress: Not on file  Social Connections: Unknown (01/26/2022)   Received from [facility]   Social Network    Social Network: Not on file  Intimate Partner Violence: Unknown (12/18/2021)   Received from [facility]   HITS    Physically Hurt: Not on file    Insult or Talk Down To: Not on file    Threaten Physical Harm: Not on file    Scream or Curse: Not on file    Past Medical History, Surgical history, Social history, and Family history were reviewed and updated as appropriate.   Please see review of systems for further details on the patient's review from today.   Objective:   Physical Exam:  There were no vitals taken for this visit.  Physical Exam Constitutional:      General: He is not in acute distress. Musculoskeletal:        General: No deformity.  Neurological:     Mental Status: He is alert and oriented to person, place, and time.     Cranial Nerves: No dysarthria.     Coordination: Coordination normal.  Psychiatric:        Attention and Perception: Attention and perception normal. He does not perceive auditory or visual hallucinations.        Mood and Affect: Mood normal. Mood is not anxious or depressed. Affect is not labile, blunt or angry.        Speech: Speech normal.        Behavior: Behavior normal. Behavior is cooperative.        Thought Content: Thought content normal. Thought content is  not paranoid or delusional. Thought content does not include homicidal or suicidal ideation. Thought content does not include suicidal plan.        Cognition and Memory: Cognition and memory normal.        Judgment: Judgment normal.     Comments: Insight intact Further reduction in anxiety with the pending end of job stress.     Lab Review:     Component Value Date/Time    NA 134 02/09/2022 1150   K 4.4 02/09/2022 1150   CL 95 (L) 02/09/2022 1150   CO2 28 02/09/2022 1150   GLUCOSE 118 (H) 02/09/2022 1150   GLUCOSE 195 (H) 11/24/2021 0457   BUN 12 02/09/2022 1150   CREATININE 0.78 02/09/2022 1150   CALCIUM 10.1 02/09/2022 1150   PROT 7.9 11/24/2021 0457   ALBUMIN 2.8 (L) 11/24/2021 0457   AST 28 11/24/2021 0457   ALT 38 11/24/2021 0457   ALKPHOS 161 (H) 11/24/2021 0457   BILITOT 0.6 11/24/2021 0457   GFRNONAA >60 11/24/2021 0457   GFRAA 113 01/15/2020 0939       Component Value Date/Time   WBC 8.0 11/24/2021 0457   RBC 3.94 (L) 11/24/2021 0457   HGB 12.4 (L) 11/24/2021 0457   HCT 36.9 (L) 11/24/2021 0457   PLT 638 (H) 11/24/2021 0457   MCV 93.7 11/24/2021 0457   MCH 31.5 11/24/2021 0457   MCHC 33.6 11/24/2021 0457   RDW 14.1 11/24/2021 0457   LYMPHSABS 1.8 11/24/2021 0457   MONOABS 0.8 11/24/2021 0457   EOSABS 0.4 11/24/2021 0457   BASOSABS 0.1 11/24/2021 0457    No results found for: POCLITH, LITHIUM   No results found for: PHENYTOIN, PHENOBARB, VALPROATE, CBMZ   .res Assessment: Plan:    Kyle Velazquez was seen today for follow-up and anxiety.  Diagnoses and all orders for this visit:  Generalized anxiety disorder -     sertraline  (ZOLOFT ) 25 MG tablet; TAKE 1 TABLET(25 MG) BY MOUTH DAILY -     ALPRAZolam  (XANAX ) 1 MG tablet; TAKE 0.5 TABLETS BY MOUTH 5 TIMES DAILY.  We discussed the short-term risks associated with benzodiazepines including sedation and increased fall risk among others.  Discussed long-term side effect risk including dependence, potential withdrawal symptoms, and the potential eventual dose-related risk of dementia.  But recent studies from 2020 dispute this association between benzodiazepines and dementia risk. Newer studies in 2020 do not support an association with dementia. Disc added risks in combo with opiates.  He's having no SE and doesn't want to change  Benefit from meds.  No complaints.   Tolerating.  Benefit with sertraline  and satisfied.  Again Discussed the pros and cons of potentially weaning the sertraline  at his next visit.  He does not feel that he can wean the alprazolam  with or without sertraline  presents.  He did well for years with just alprazolam  until business stress got out of hand.  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   Therefore no med changes today  Continue sertraline  25 mg daily Xanax  0.5 mg 5 times daily  FU 12 mos bc stability  Lorene Macintosh, MD, DFAPA   Please see After Visit Summary for patient specific instructions.  Future Appointments  Date Time Provider Department Center  08/03/2024  9:00 AM Wonda Sharper, MD [facility]     No orders of the defined types were placed in this encounter.     -------------------------------

## 2024-08-03 ENCOUNTER — Encounter: Payer: Self-pay | Admitting: Cardiovascular Disease

## 2024-08-03 ENCOUNTER — Ambulatory Visit: Attending: Cardiovascular Disease | Admitting: Cardiovascular Disease

## 2024-08-03 VITALS — BP 160/60 | HR 48 | Ht 66.0 in | Wt 137.2 lb

## 2024-08-03 DIAGNOSIS — I10 Essential (primary) hypertension: Secondary | ICD-10-CM | POA: Diagnosis not present

## 2024-08-03 DIAGNOSIS — R001 Bradycardia, unspecified: Secondary | ICD-10-CM | POA: Diagnosis not present

## 2024-08-03 DIAGNOSIS — E782 Mixed hyperlipidemia: Secondary | ICD-10-CM | POA: Insufficient documentation

## 2024-08-03 NOTE — Patient Instructions (Signed)
 Medication Instructions:  No medication changes were made at this visit. Continue current regimen.   *If you need a refill on your cardiac medications before your next appointment, please call your pharmacy*  Lab Work: None ordered today. If you have labs (blood work) drawn today and your tests are completely normal, you will receive your results only by: MyChart Message (if you have MyChart) OR A paper copy in the mail If you have any lab test that is abnormal or we need to change your treatment, we will call you to review the results.  Testing/Procedures: None ordered today.  Follow-Up: At [facility], you and your health needs are our priority.  As part of our continuing mission to provide you with exceptional heart care, our providers are all part of one team.  This team includes your primary Cardiologist (physician) and Advanced Practice Providers or APPs (Physician Assistants and Nurse Practitioners) who all work together to provide you with the care you need, when you need it.  Your next appointment:   1 year(s)  Provider:   Ozell Fell, MD    We recommend signing up for the patient portal called MyChart.  Sign up information is provided on this After Visit Summary.  MyChart is used to connect with patients for Virtual Visits (Telemedicine).  Patients are able to view lab/test results, encounter notes, upcoming appointments, etc.  Non-urgent messages can be sent to your provider as well.   To learn more about what you can do with MyChart, go to [email or web address].   Other Instructions Monitor blood pressure twice weekly.

## 2024-08-03 NOTE — Assessment & Plan Note (Signed)
 LDL cholesterol is 50 mg/dL.

## 2024-08-03 NOTE — Progress Notes (Signed)
 Cardiology Office Note:    Date:  08/03/2024   ID:  Kyle Velazquez, DOB January 09, 1956, MRN [id number]  PCP:  Nichole Senior, MD   [facility] Providers Cardiologist:  Ozell Fell, MD     Referring MD: Nichole Senior, MD   Chief Complaint  Patient presents with   Hypertension    History of Present Illness:    Kyle Velazquez is a 68 y.o. male presenting for cardiology follow-up, previously followed by Dr Alveta. His pertinent medical problems include:  Hypertension  Sinus bradycardia  Diabetes mellitus  Celiac disease Hyperlipidemia  Neuropathy  Anemia of chronic disease Anxiety  He is here alone today. Reports that he was diagnosed with Type I diabetes. He recently saw Dr Nichole and reports thatv his BP was 123 mmHg. He feels well and exercises regularly, working out about 90 minutes daily. He denies any exertional symptoms.   Today, he denies symptoms of palpitations, chest pain, shortness of breath, orthopnea, PND, lower extremity edema, dizziness, or syncope.    Current Medications: Current Meds  Medication Sig   ALPRAZolam  (XANAX ) 1 MG tablet TAKE 0.5 TABLETS BY MOUTH 5 TIMES DAILY.   amLODipine  (NORVASC ) 10 MG tablet Take 5 mg by mouth daily.   Continuous Blood Gluc Sensor (FREESTYLE LIBRE 14 DAY SENSOR) MISC by Does not apply route.   fluocinonide cream (LIDEX) 0.05 % as needed.   fluticasone (FLONASE) 50 MCG/ACT nasal spray Place 2 sprays into both nostrils at bedtime as needed for allergies or rhinitis.   glucose blood test strip 1 each by Other route as needed for other. Use as instructed   hydrochlorothiazide  (HYDRODIURIL ) 25 MG tablet Take 1 tablet (25 mg total) by mouth daily. Patient needs appointment for any future refills.  Please call office at [phone] to schedule appointment. 1st attempt.   hydrocortisone  2.5 % cream Apply 1 application. topically 2 (two) times daily as needed (skin irritation).   insulin  aspart (NOVOLOG ) 100 UNIT/ML  injection Inject 0-70 Units into the skin See admin instructions. Inject up to 70 units daily via pump on sliding scale   omeprazole (PRILOSEC) 40 MG capsule Take 40 mg by mouth daily.   PERCOCET 10-325 MG tablet Take 1 tablet by mouth every 6 (six) hours as needed for pain.   sertraline  (ZOLOFT ) 25 MG tablet TAKE 1 TABLET(25 MG) BY MOUTH DAILY   sildenafil  (VIAGRA ) 100 MG tablet Take 1 tablet (100 mg total) by mouth daily as needed for erectile dysfunction.   valACYclovir  (VALTREX ) 500 MG tablet Take 500 mg by mouth daily.   valsartan  (DIOVAN ) 320 MG tablet Take 1 tablet (320 mg total) by mouth daily.     Allergies:   Patient has no known allergies.   ROS:   Please see the history of present illness.    All other systems reviewed and are negative.  EKGs/Labs/Other Studies Reviewed:    The following studies were reviewed today: Cardiac Studies & Procedures   ______________________________________________________________________________________________     ECHOCARDIOGRAM  ECHOCARDIOGRAM COMPLETE 11/20/2021  Narrative ECHOCARDIOGRAM REPORT    Patient Name:   Kyle Velazquez Date of Exam: 11/20/2021 Medical Rec #:  [id number]       Height:       66.0 in Accession #:    [id number]      Weight:       139.0 lb Date of Birth:  19-Oct-1955        BSA:          1.713 m Patient Age:  65 years        BP:           161/77 mmHg Patient Gender: M               HR:           71 bpm. Exam Location:  Inpatient  Procedure: 2D Echo, Cardiac Doppler, Color Doppler and Strain Analysis  Indications:    Endocarditis  History:        Patient has no prior history of Echocardiogram examinations. Risk Factors:Dyslipidemia, Hypertension and Diabetes.  Sonographer:    EMMIE DEW RDCS Referring Phys: [id number] [facility]  IMPRESSIONS   1. Left ventricular ejection fraction, by estimation, is 70 to 75%. The left ventricle has hyperdynamic function. The left ventricle has no regional wall motion  abnormalities. Left ventricular diastolic parameters are consistent with Grade II diastolic dysfunction (pseudonormalization). The average left ventricular global longitudinal strain is -23.0 %. The global longitudinal strain is normal. 2. Right ventricular systolic function is normal. The right ventricular size is moderately enlarged. There is mildly elevated pulmonary artery systolic pressure. 3. Left atrial size was moderately dilated. 4. Right atrial size was moderately dilated. 5. The mitral valve is normal in structure. Mild mitral valve regurgitation. No evidence of mitral stenosis. 6. There is a small (< 1 cm) independently mobilty echodensity adjacent to the insertion point of the leaflet best seen starting image series 25. Seen in image set 50 as a homogeneous echodensity on the base of the posterior leaflet.. The tricuspid valve is abnormal. 7. The aortic valve is abnormal There is a small echodensity on the ventricular side of the aortic valve that is small and may represent calcium. Aortic valve regurgitation is not visualized. Aortic valve sclerosis is present, with no evidence of aortic valve stenosis.  Comparison(s): No prior Echocardiogram.  Conclusion(s)/Recommendation(s): Consider TEE given the clinical question of infective endocarditis.  FINDINGS Left Ventricle: Left ventricular ejection fraction, by estimation, is 70 to 75%. The left ventricle has hyperdynamic function. The left ventricle has no regional wall motion abnormalities. The average left ventricular global longitudinal strain is -23.0 %. The global longitudinal strain is normal. The left ventricular internal cavity size was normal in size. There is no left ventricular hypertrophy. Left ventricular diastolic parameters are consistent with Grade II diastolic dysfunction (pseudonormalization).  Right Ventricle: The right ventricular size is moderately enlarged. No increase in right ventricular wall thickness. Right  ventricular systolic function is normal. There is mildly elevated pulmonary artery systolic pressure. The tricuspid regurgitant velocity is 2.89 m/s, and with an assumed right atrial pressure of 3 mmHg, the estimated right ventricular systolic pressure is 36.4 mmHg.  Left Atrium: Left atrial size was moderately dilated.  Right Atrium: Right atrial size was moderately dilated.  Pericardium: There is no evidence of pericardial effusion.  Mitral Valve: The mitral valve is normal in structure. Mild mitral valve regurgitation. No evidence of mitral valve stenosis. MV peak gradient, 5.6 mmHg. The mean mitral valve gradient is 2.0 mmHg.  Tricuspid Valve: There is a small (< 1 cm) independently mobilty echodensity adjacent to the insertion point of the leaflet best seen starting image series 25. Seen in image set 50 as a homogeneous echodensity on the base of the posterior leaflet. The tricuspid valve is abnormal. Tricuspid valve regurgitation is not demonstrated. No evidence of tricuspid stenosis.  Aortic Valve: The aortic valve is abnormal. Aortic valve regurgitation is not visualized. Aortic valve sclerosis is present, with no evidence of aortic  valve stenosis. Aortic valve mean gradient measures 8.0 mmHg. Aortic valve peak gradient measures 14.3 mmHg. Aortic valve area, by VTI measures 4.19 cm.  Pulmonic Valve: The pulmonic valve was grossly normal. Pulmonic valve regurgitation is not visualized. No evidence of pulmonic stenosis.  Aorta: The aortic root is normal in size and structure.  IAS/Shunts: No atrial level shunt detected by color flow Doppler.   LEFT VENTRICLE PLAX 2D LVIDd:         4.90 cm      Diastology LVIDs:         3.00 cm      LV e' medial:    9.68 cm/s LV PW:         0.80 cm      LV E/e' medial:  10.8 LV IVS:        0.80 cm      LV e' lateral:   11.10 cm/s LVOT diam:     2.50 cm      LV E/e' lateral: 9.5 LV SV:         170 LV SV Index:   99           2D Longitudinal  Strain LVOT Area:     4.91 cm     2D Strain GLS Avg:     -23.0 %  LV Volumes (MOD) LV vol d, MOD A2C: 104.0 ml LV vol d, MOD A4C: 113.0 ml LV vol s, MOD A2C: 25.8 ml LV vol s, MOD A4C: 31.7 ml LV SV MOD A2C:     78.2 ml LV SV MOD A4C:     113.0 ml LV SV MOD BP:      82.5 ml  RIGHT VENTRICLE RV Basal diam:  5.10 cm RV Mid diam:    2.90 cm RV S prime:     19.30 cm/s TAPSE (M-mode): 3.2 cm  LEFT ATRIUM           Index        RIGHT ATRIUM           Index LA diam:      4.00 cm 2.33 cm/m   RA Area:     23.50 cm LA Vol (A2C): 75.0 ml 43.78 ml/m  RA Volume:   76.20 ml  44.48 ml/m AORTIC VALVE                     PULMONIC VALVE AV Area (Vmax):    4.35 cm      PV Vmax:       1.14 m/s AV Area (Vmean):   3.90 cm      PV Vmean:      75.200 cm/s AV Area (VTI):     4.19 cm      PV VTI:        0.227 m AV Vmax:           189.00 cm/s   PV Peak grad:  5.2 mmHg AV Vmean:          129.500 cm/s  PV Mean grad:  3.0 mmHg AV VTI:            0.405 m AV Peak Grad:      14.3 mmHg AV Mean Grad:      8.0 mmHg LVOT Vmax:         167.50 cm/s LVOT Vmean:        103.000 cm/s LVOT VTI:          0.346 m LVOT/AV  VTI ratio: 0.85  AORTA Ao Root diam: 3.40 cm  MITRAL VALVE                TRICUSPID VALVE MV Area (PHT): 3.60 cm     TR Peak grad:   33.4 mmHg MV Area VTI:   4.47 cm     TR Vmax:        289.00 cm/s MV Peak grad:  5.6 mmHg MV Mean grad:  2.0 mmHg     SHUNTS MV Vmax:       1.18 m/s     Systemic VTI:  0.35 m MV Vmean:      73.0 cm/s    Systemic Diam: 2.50 cm MV Decel Time: 211 msec MR Peak grad: 104.0 mmHg MR Vmax:      510.00 cm/s MV E velocity: 105.00 cm/s MV A velocity: 116.00 cm/s MV E/A ratio:  0.91  Stanly Leavens MD Electronically signed by Stanly Leavens MD Signature Date/Time: 11/20/2021/3:47:44 PM    Final   TEE  ECHO TEE 11/23/2021  Narrative TRANSESOPHOGEAL ECHO REPORT    Patient Name:   Kyle Velazquez Date of Exam: 11/23/2021 Medical Rec #:   [id number]       Height:       66.0 in Accession #:    [id number]      Weight:       139.0 lb Date of Birth:  1956-07-09        BSA:          1.713 m Patient Age:    65 years        BP:           128/70 mmHg Patient Gender: M               HR:           76 bpm. Exam Location:  Inpatient  Procedure: 3D Echo, Transesophageal Echo, Cardiac Doppler and Color Doppler  Indications:     Endocarditis  History:         Patient has prior history of Echocardiogram examinations, most recent 11/20/2021. Signs/Symptoms:Chest Pain; Risk Factors:Hypertension, Diabetes and Dyslipidemia.  Sonographer:     Ellouise Mose RDCS Referring Phys:  [id number] [facility] Diagnosing Phys: Lonni Nanas MD  PROCEDURE: After discussion of the risks and benefits of a TEE, an informed consent was obtained from the patient. The transesophogeal probe was passed without difficulty through the esophogus of the patient. Imaged were obtained with the patient in a left lateral decubitus position. Sedation performed by different physician. The patient was monitored while under deep sedation. Anesthestetic sedation was provided intravenously by Anesthesiology: 116mg  of Propofol . The patient developed no complications during the procedure.  IMPRESSIONS   1. Left ventricular ejection fraction, by estimation, is 65 to 70%. The left ventricle has normal function. The left ventricle has no regional wall motion abnormalities. 2. Right ventricular systolic function is normal. The right ventricular size is mildly enlarged. 3. Left atrial size was mildly dilated. No left atrial/left atrial appendage thrombus was detected. 4. Right atrial size was mildly dilated. 5. The mitral valve is normal in structure. Trivial mitral valve regurgitation. 6. The aortic valve is tricuspid. Aortic valve regurgitation is not visualized. No aortic stenosis is present.  Conclusion(s)/Recommendation(s): No evidence of vegetation/infective  endocarditis on this transesophageael echocardiogram.  FINDINGS Left Ventricle: Left ventricular ejection fraction, by estimation, is 65 to 70%. The left ventricle has normal function. The left ventricle has no regional wall motion  abnormalities. The left ventricular internal cavity size was normal in size.  Right Ventricle: The right ventricular size is mildly enlarged. No increase in right ventricular wall thickness. Right ventricular systolic function is normal.  Left Atrium: Left atrial size was mildly dilated. No left atrial/left atrial appendage thrombus was detected.  Right Atrium: Right atrial size was mildly dilated. Prominent Eustachian valve.  Pericardium: Trivial pericardial effusion is present.  Mitral Valve: The mitral valve is normal in structure. Trivial mitral valve regurgitation.  Tricuspid Valve: The tricuspid valve is normal in structure. Tricuspid valve regurgitation is trivial.  Aortic Valve: The aortic valve is tricuspid. Aortic valve regurgitation is not visualized. No aortic stenosis is present. Aortic valve mean gradient measures 5.0 mmHg. Aortic valve peak gradient measures 10.1 mmHg.  Pulmonic Valve: The pulmonic valve was grossly normal. Pulmonic valve regurgitation is trivial.  Aorta: The aortic root and ascending aorta are structurally normal, with no evidence of dilitation.  IAS/Shunts: No atrial level shunt detected by color flow Doppler.   LEFT VENTRICLE PLAX 2D LVOT diam:     2.10 cm LVOT Area:     3.46 cm 3D Volume EF LV 3D EDV:   71.02 ml LV 3D ESV:   24.94 ml  AORTIC VALVE AV Vmax:      159.00 cm/s AV Vmean:     103.000 cm/s AV VTI:       0.311 m AV Peak Grad: 10.1 mmHg AV Mean Grad: 5.0 mmHg   SHUNTS Systemic Diam: 2.10 cm  Lonni Nanas MD Electronically signed by Lonni Nanas MD Signature Date/Time: 11/23/2021/1:43:42 PM    Final         ______________________________________________________________________________________________      EKG:   EKG Interpretation Date/Time:  Friday August 03 2024 09:09:18 EST Ventricular Rate:  48 PR Interval:  184 QRS Duration:  92 QT Interval:  438 QTC Calculation: 391 R Axis:   92  Text Interpretation: Sinus bradycardia with marked sinus arrhythmia Rightward axis When compared with ECG of 02-Aug-2023 15:24, Premature ventricular complexes are no longer Present Confirmed by Wonda Sharper [id number]) on 08/03/2024 9:29:01 AM    Recent Labs: No results found for requested labs within last 365 days.  Recent Lipid Panel No results found for: CHOL, TRIG, HDL, CHOLHDL, VLDL, LDLCALC, LDLDIRECT         Physical Exam:    VS:  BP (!) 160/60 (BP Location: Left Arm, Patient Position: Sitting, Cuff Size: Normal)   Pulse (!) 48   Ht 5' 6 (1.676 m)   Wt 137 lb 3.2 oz (62.2 kg)   SpO2 99%   BMI 22.14 kg/m     Wt Readings from Last 3 Encounters:  08/03/24 137 lb 3.2 oz (62.2 kg)  01/30/24 136 lb (61.7 kg)  08/02/23 141 lb 12.8 oz (64.3 kg)     GEN:  Well nourished, well developed in no acute distress HEENT: Normal NECK: No JVD; No carotid bruits LYMPHATICS: No lymphadenopathy CARDIAC: RRR, no murmurs, rubs, gallops RESPIRATORY:  Clear to auscultation without rales, wheezing or rhonchi  ABDOMEN: Soft, non-tender, non-distended MUSCULOSKELETAL:  No edema; No deformity  SKIN: Warm and dry NEUROLOGIC:  Alert and oriented x 3 PSYCHIATRIC:  Normal affect   Assessment & Plan Essential hypertension The patient reports home readings in range.  I repeated his blood pressure today and measured it at 150/60 mmHg.  I asked him to monitor his blood pressure 2-3 times per week and reach back out if his readings are greater than  140 mmHg systolic.  When I look back up for his other medical encounters his blood pressure has been well-controlled over time.  Continue  valsartan , amlodipine , and hydrochlorothiazide  at current doses. Mixed hyperlipidemia LDL cholesterol is 50 mg/dL. Bradycardia Longstanding, asymptomatic.             Medication Adjustments/Labs and Tests Ordered: Current medicines are reviewed at length with the patient today.  Concerns regarding medicines are outlined above.  Orders Placed This Encounter  Procedures   EKG 12-Lead   No orders of the defined types were placed in this encounter.   Patient Instructions  Medication Instructions:  No medication changes were made at this visit. Continue current regimen.   *If you need a refill on your cardiac medications before your next appointment, please call your pharmacy*  Lab Work: None ordered today. If you have labs (blood work) drawn today and your tests are completely normal, you will receive your results only by: MyChart Message (if you have MyChart) OR A paper copy in the mail If you have any lab test that is abnormal or we need to change your treatment, we will call you to review the results.  Testing/Procedures: None ordered today.  Follow-Up: At [facility]: [facility], you and your health needs are our priority.  As part of our continuing mission to provide you with exceptional heart care, our providers are all part of one team.  This team includes your primary Cardiologist (physician) and Advanced Practice Providers or APPs (Physician Assistants and Nurse Practitioners) who all work together to provide you with the care you need, when you need it.  Your next appointment:   1 year(s)  Provider:   Ozell Fell, MD    We recommend signing up for the patient portal called MyChart.  Sign up information is provided on this After Visit Summary.  MyChart is used to connect with patients for Virtual Visits (Telemedicine).  Patients are able to view lab/test results, encounter notes, upcoming appointments, etc.  Non-urgent messages can be sent to your provider as  well.   To learn more about what you can do with MyChart, go to [email or web address].   Other Instructions Monitor blood pressure twice weekly.    Signed, Ozell Fell, MD  08/03/2024 12:54 PM    [facility]

## 2024-08-03 NOTE — Assessment & Plan Note (Signed)
 The patient reports home readings in range.  I repeated his blood pressure today and measured it at 150/60 mmHg.  I asked him to monitor his blood pressure 2-3 times per week and reach back out if his readings are greater than 140 mmHg systolic.  When I look back up for his other medical encounters his blood pressure has been well-controlled over time.  Continue valsartan , amlodipine , and hydrochlorothiazide  at current doses.

## 2024-08-03 NOTE — Assessment & Plan Note (Signed)
 Longstanding, asymptomatic.

## 2024-10-08 ENCOUNTER — Telehealth: Payer: Self-pay

## 2024-10-14 NOTE — Telephone Encounter (Signed)
 PA approved Alprazolam  1 mg #75/30 day 09/13/24-10/08/25 [employer] [place] [employer]

## 2025-06-03 ENCOUNTER — Ambulatory Visit: Admitting: Psychiatry
